# Patient Record
Sex: Female | Born: 1955 | Race: White | Hispanic: No | Smoking: Former smoker
Health system: Southern US, Community
[De-identification: ages and names within clinical notes are randomized; demographics above are authoritative.]

## PROBLEM LIST (undated history)

## (undated) DIAGNOSIS — F32A Depression, unspecified: Secondary | ICD-10-CM

## (undated) DIAGNOSIS — F988 Other specified behavioral and emotional disorders with onset usually occurring in childhood and adolescence: Secondary | ICD-10-CM

## (undated) DIAGNOSIS — Z8739 Personal history of other diseases of the musculoskeletal system and connective tissue: Secondary | ICD-10-CM

## (undated) DIAGNOSIS — E785 Hyperlipidemia, unspecified: Secondary | ICD-10-CM

## (undated) DIAGNOSIS — I469 Cardiac arrest, cause unspecified: Secondary | ICD-10-CM

## (undated) DIAGNOSIS — C801 Malignant (primary) neoplasm, unspecified: Secondary | ICD-10-CM

## (undated) DIAGNOSIS — R7303 Prediabetes: Secondary | ICD-10-CM

## (undated) DIAGNOSIS — F329 Major depressive disorder, single episode, unspecified: Secondary | ICD-10-CM

## (undated) DIAGNOSIS — F419 Anxiety disorder, unspecified: Secondary | ICD-10-CM

## (undated) DIAGNOSIS — T7840XA Allergy, unspecified, initial encounter: Secondary | ICD-10-CM

## (undated) DIAGNOSIS — K5900 Constipation, unspecified: Secondary | ICD-10-CM

## (undated) DIAGNOSIS — I10 Essential (primary) hypertension: Secondary | ICD-10-CM

## (undated) HISTORY — DX: Personal history of other diseases of the musculoskeletal system and connective tissue: Z87.39

## (undated) HISTORY — DX: Hyperlipidemia, unspecified: E78.5

## (undated) HISTORY — DX: Prediabetes: R73.03

## (undated) HISTORY — DX: Other specified behavioral and emotional disorders with onset usually occurring in childhood and adolescence: F98.8

## (undated) HISTORY — DX: Allergy, unspecified, initial encounter: T78.40XA

## (undated) HISTORY — DX: Essential (primary) hypertension: I10

## (undated) HISTORY — DX: Malignant (primary) neoplasm, unspecified: C80.1

## (undated) HISTORY — DX: Cardiac arrest, cause unspecified: I46.9

## (undated) HISTORY — DX: Constipation, unspecified: K59.00

---

## 1989-08-24 DIAGNOSIS — I469 Cardiac arrest, cause unspecified: Secondary | ICD-10-CM

## 1989-08-24 HISTORY — DX: Cardiac arrest, cause unspecified: I46.9

## 2000-12-27 ENCOUNTER — Other Ambulatory Visit: Admission: RE | Admit: 2000-12-27 | Discharge: 2000-12-27 | Payer: Self-pay | Admitting: Obstetrics and Gynecology

## 2001-08-24 HISTORY — PX: BREAST LUMPECTOMY: SHX2

## 2002-02-28 ENCOUNTER — Encounter (INDEPENDENT_AMBULATORY_CARE_PROVIDER_SITE_OTHER): Payer: Self-pay | Admitting: Specialist

## 2002-02-28 ENCOUNTER — Encounter: Payer: Self-pay | Admitting: General Surgery

## 2002-02-28 ENCOUNTER — Encounter: Admission: RE | Admit: 2002-02-28 | Discharge: 2002-02-28 | Payer: Self-pay | Admitting: General Surgery

## 2002-03-13 ENCOUNTER — Encounter (INDEPENDENT_AMBULATORY_CARE_PROVIDER_SITE_OTHER): Payer: Self-pay | Admitting: *Deleted

## 2002-03-13 ENCOUNTER — Encounter: Admission: RE | Admit: 2002-03-13 | Discharge: 2002-03-13 | Payer: Self-pay | Admitting: General Surgery

## 2002-03-13 ENCOUNTER — Ambulatory Visit (HOSPITAL_BASED_OUTPATIENT_CLINIC_OR_DEPARTMENT_OTHER): Admission: RE | Admit: 2002-03-13 | Discharge: 2002-03-13 | Payer: Self-pay | Admitting: General Surgery

## 2002-03-13 ENCOUNTER — Encounter: Payer: Self-pay | Admitting: General Surgery

## 2002-03-23 ENCOUNTER — Ambulatory Visit: Admission: RE | Admit: 2002-03-23 | Discharge: 2002-06-21 | Payer: Self-pay | Admitting: Radiation Oncology

## 2002-03-29 ENCOUNTER — Ambulatory Visit (HOSPITAL_COMMUNITY): Admission: RE | Admit: 2002-03-29 | Discharge: 2002-03-29 | Payer: Self-pay | Admitting: Oncology

## 2002-03-29 ENCOUNTER — Encounter: Payer: Self-pay | Admitting: Oncology

## 2002-03-31 ENCOUNTER — Encounter: Payer: Self-pay | Admitting: Oncology

## 2002-03-31 ENCOUNTER — Ambulatory Visit (HOSPITAL_COMMUNITY): Admission: RE | Admit: 2002-03-31 | Discharge: 2002-03-31 | Payer: Self-pay | Admitting: Oncology

## 2002-06-18 ENCOUNTER — Encounter: Payer: Self-pay | Admitting: Otolaryngology

## 2002-06-18 ENCOUNTER — Ambulatory Visit (HOSPITAL_COMMUNITY): Admission: RE | Admit: 2002-06-18 | Discharge: 2002-06-18 | Payer: Self-pay | Admitting: Otolaryngology

## 2002-06-27 ENCOUNTER — Ambulatory Visit: Admission: RE | Admit: 2002-06-27 | Discharge: 2002-09-02 | Payer: Self-pay | Admitting: Radiation Oncology

## 2002-08-16 ENCOUNTER — Ambulatory Visit (HOSPITAL_COMMUNITY): Admission: RE | Admit: 2002-08-16 | Discharge: 2002-08-16 | Payer: Self-pay | Admitting: Radiation Oncology

## 2002-08-30 ENCOUNTER — Ambulatory Visit (HOSPITAL_COMMUNITY): Admission: RE | Admit: 2002-08-30 | Discharge: 2002-08-30 | Payer: Self-pay | Admitting: Radiation Oncology

## 2002-09-20 ENCOUNTER — Encounter: Payer: Self-pay | Admitting: Oncology

## 2002-09-20 ENCOUNTER — Ambulatory Visit (HOSPITAL_COMMUNITY): Admission: RE | Admit: 2002-09-20 | Discharge: 2002-09-20 | Payer: Self-pay | Admitting: Oncology

## 2002-10-05 ENCOUNTER — Encounter: Payer: Self-pay | Admitting: Internal Medicine

## 2002-10-05 ENCOUNTER — Encounter: Admission: RE | Admit: 2002-10-05 | Discharge: 2002-10-05 | Payer: Self-pay | Admitting: Internal Medicine

## 2002-10-11 ENCOUNTER — Ambulatory Visit: Admission: RE | Admit: 2002-10-11 | Discharge: 2002-10-11 | Payer: Self-pay | Admitting: Oncology

## 2003-02-12 ENCOUNTER — Other Ambulatory Visit: Admission: RE | Admit: 2003-02-12 | Discharge: 2003-02-12 | Payer: Self-pay | Admitting: Obstetrics and Gynecology

## 2003-03-06 ENCOUNTER — Ambulatory Visit: Admission: RE | Admit: 2003-03-06 | Discharge: 2003-03-06 | Payer: Self-pay | Admitting: Oncology

## 2003-03-12 ENCOUNTER — Encounter: Payer: Self-pay | Admitting: Oncology

## 2003-03-12 ENCOUNTER — Encounter: Admission: RE | Admit: 2003-03-12 | Discharge: 2003-03-12 | Payer: Self-pay | Admitting: Oncology

## 2003-07-11 ENCOUNTER — Encounter: Admission: RE | Admit: 2003-07-11 | Discharge: 2003-07-11 | Payer: Self-pay | Admitting: Oncology

## 2003-08-25 HISTORY — PX: CARDIOVASCULAR STRESS TEST: SHX262

## 2003-10-30 ENCOUNTER — Encounter: Payer: Self-pay | Admitting: Internal Medicine

## 2004-02-04 ENCOUNTER — Encounter: Admission: RE | Admit: 2004-02-04 | Discharge: 2004-02-04 | Payer: Self-pay | Admitting: Obstetrics and Gynecology

## 2004-06-11 ENCOUNTER — Ambulatory Visit (HOSPITAL_BASED_OUTPATIENT_CLINIC_OR_DEPARTMENT_OTHER): Admission: RE | Admit: 2004-06-11 | Discharge: 2004-06-11 | Payer: Self-pay | Admitting: Obstetrics and Gynecology

## 2004-06-11 ENCOUNTER — Ambulatory Visit (HOSPITAL_COMMUNITY): Admission: RE | Admit: 2004-06-11 | Discharge: 2004-06-11 | Payer: Self-pay | Admitting: Obstetrics and Gynecology

## 2004-06-11 ENCOUNTER — Encounter (INDEPENDENT_AMBULATORY_CARE_PROVIDER_SITE_OTHER): Payer: Self-pay | Admitting: Specialist

## 2004-08-24 HISTORY — PX: COLONOSCOPY: SHX174

## 2004-09-11 ENCOUNTER — Ambulatory Visit: Payer: Self-pay | Admitting: Oncology

## 2004-10-01 ENCOUNTER — Encounter: Admission: RE | Admit: 2004-10-01 | Discharge: 2004-10-01 | Payer: Self-pay | Admitting: Oncology

## 2005-03-03 ENCOUNTER — Ambulatory Visit: Payer: Self-pay | Admitting: Oncology

## 2005-03-10 ENCOUNTER — Encounter: Admission: RE | Admit: 2005-03-10 | Discharge: 2005-03-10 | Payer: Self-pay | Admitting: Oncology

## 2005-03-20 ENCOUNTER — Ambulatory Visit: Payer: Self-pay | Admitting: Internal Medicine

## 2005-04-10 ENCOUNTER — Encounter: Admission: RE | Admit: 2005-04-10 | Discharge: 2005-07-09 | Payer: Self-pay | Admitting: Internal Medicine

## 2005-04-20 ENCOUNTER — Ambulatory Visit: Payer: Self-pay | Admitting: Gastroenterology

## 2005-04-20 ENCOUNTER — Ambulatory Visit: Payer: Self-pay | Admitting: Family Medicine

## 2005-04-29 ENCOUNTER — Ambulatory Visit: Payer: Self-pay | Admitting: Gastroenterology

## 2005-06-09 ENCOUNTER — Ambulatory Visit: Payer: Self-pay | Admitting: Family Medicine

## 2005-06-16 ENCOUNTER — Ambulatory Visit: Payer: Self-pay | Admitting: Family Medicine

## 2005-06-23 ENCOUNTER — Ambulatory Visit: Payer: Self-pay | Admitting: Family Medicine

## 2005-09-03 ENCOUNTER — Ambulatory Visit: Payer: Self-pay | Admitting: Oncology

## 2005-10-02 ENCOUNTER — Ambulatory Visit: Payer: Self-pay | Admitting: Internal Medicine

## 2005-11-05 ENCOUNTER — Ambulatory Visit: Payer: Self-pay | Admitting: Oncology

## 2005-12-28 ENCOUNTER — Ambulatory Visit: Payer: Self-pay | Admitting: Internal Medicine

## 2006-02-26 ENCOUNTER — Ambulatory Visit: Payer: Self-pay | Admitting: Oncology

## 2006-03-02 ENCOUNTER — Ambulatory Visit: Payer: Self-pay | Admitting: Gastroenterology

## 2006-03-05 LAB — CBC WITH DIFFERENTIAL/PLATELET
BASO%: 0.6 % (ref 0.0–2.0)
EOS%: 3.6 % (ref 0.0–7.0)
LYMPH%: 46.7 % (ref 14.0–48.0)
MCH: 33.2 pg (ref 26.0–34.0)
MCHC: 34.9 g/dL (ref 32.0–36.0)
MONO#: 0.3 10*3/uL (ref 0.1–0.9)
Platelets: 283 10*3/uL (ref 145–400)
RBC: 4.12 10*6/uL (ref 3.70–5.32)
WBC: 5.5 10*3/uL (ref 3.9–10.0)

## 2006-03-05 LAB — CANCER ANTIGEN 27.29: CA 27.29: 14 U/mL (ref 0–39)

## 2006-03-05 LAB — COMPREHENSIVE METABOLIC PANEL
ALT: 54 U/L — ABNORMAL HIGH (ref 0–40)
AST: 44 U/L — ABNORMAL HIGH (ref 0–37)
Alkaline Phosphatase: 69 U/L (ref 39–117)
CO2: 26 mEq/L (ref 19–32)
Creatinine, Ser: 0.68 mg/dL (ref 0.40–1.20)
Sodium: 142 mEq/L (ref 135–145)
Total Bilirubin: 0.3 mg/dL (ref 0.3–1.2)
Total Protein: 6.7 g/dL (ref 6.0–8.3)

## 2006-03-11 ENCOUNTER — Encounter: Admission: RE | Admit: 2006-03-11 | Discharge: 2006-03-11 | Payer: Self-pay | Admitting: Oncology

## 2006-08-03 ENCOUNTER — Ambulatory Visit: Payer: Self-pay | Admitting: Gastroenterology

## 2006-09-23 ENCOUNTER — Ambulatory Visit: Payer: Self-pay | Admitting: Oncology

## 2006-09-28 ENCOUNTER — Encounter: Admission: RE | Admit: 2006-09-28 | Discharge: 2006-09-28 | Payer: Self-pay | Admitting: Oncology

## 2006-09-28 LAB — CBC WITH DIFFERENTIAL/PLATELET
BASO%: 0.1 % (ref 0.0–2.0)
EOS%: 2.4 % (ref 0.0–7.0)
HCT: 38.4 % (ref 34.8–46.6)
LYMPH%: 31.1 % (ref 14.0–48.0)
MCH: 33.3 pg (ref 26.0–34.0)
MCHC: 36.1 g/dL — ABNORMAL HIGH (ref 32.0–36.0)
MCV: 92.1 fL (ref 81.0–101.0)
MONO%: 6.7 % (ref 0.0–13.0)
NEUT%: 59.7 % (ref 39.6–76.8)
Platelets: 275 10*3/uL (ref 145–400)
RBC: 4.18 10*6/uL (ref 3.70–5.32)
WBC: 8.5 10*3/uL (ref 3.9–10.0)

## 2006-09-28 LAB — COMPREHENSIVE METABOLIC PANEL
ALT: 38 U/L — ABNORMAL HIGH (ref 0–35)
Alkaline Phosphatase: 79 U/L (ref 39–117)
Creatinine, Ser: 0.67 mg/dL (ref 0.40–1.20)
Sodium: 138 mEq/L (ref 135–145)
Total Bilirubin: 0.4 mg/dL (ref 0.3–1.2)
Total Protein: 7.8 g/dL (ref 6.0–8.3)

## 2007-03-14 ENCOUNTER — Encounter: Admission: RE | Admit: 2007-03-14 | Discharge: 2007-03-14 | Payer: Self-pay | Admitting: Oncology

## 2007-03-22 ENCOUNTER — Encounter: Admission: RE | Admit: 2007-03-22 | Discharge: 2007-03-22 | Payer: Self-pay | Admitting: Oncology

## 2007-03-28 ENCOUNTER — Ambulatory Visit: Payer: Self-pay | Admitting: Oncology

## 2007-03-30 LAB — CBC WITH DIFFERENTIAL/PLATELET
Basophils Absolute: 0 10*3/uL (ref 0.0–0.1)
EOS%: 1.8 % (ref 0.0–7.0)
Eosinophils Absolute: 0.1 10*3/uL (ref 0.0–0.5)
HCT: 42.9 % (ref 34.8–46.6)
HGB: 15.2 g/dL (ref 11.6–15.9)
MCH: 33.9 pg (ref 26.0–34.0)
MCV: 95.6 fL (ref 81.0–101.0)
MONO%: 7 % (ref 0.0–13.0)
NEUT#: 5 10*3/uL (ref 1.5–6.5)
NEUT%: 62.3 % (ref 39.6–76.8)
RDW: 12.8 % (ref 11.3–14.5)
lymph#: 2.3 10*3/uL (ref 0.9–3.3)

## 2007-03-30 LAB — COMPREHENSIVE METABOLIC PANEL
AST: 32 U/L (ref 0–37)
Albumin: 4.7 g/dL (ref 3.5–5.2)
BUN: 14 mg/dL (ref 6–23)
Calcium: 9.8 mg/dL (ref 8.4–10.5)
Chloride: 102 mEq/L (ref 96–112)
Creatinine, Ser: 0.83 mg/dL (ref 0.40–1.20)
Glucose, Bld: 96 mg/dL (ref 70–99)
Potassium: 4.2 mEq/L (ref 3.5–5.3)

## 2007-03-30 LAB — CANCER ANTIGEN 27.29: CA 27.29: 16 U/mL (ref 0–39)

## 2007-03-30 LAB — LACTATE DEHYDROGENASE: LDH: 170 U/L (ref 94–250)

## 2007-04-04 ENCOUNTER — Encounter: Payer: Self-pay | Admitting: Internal Medicine

## 2007-06-07 ENCOUNTER — Ambulatory Visit: Payer: Self-pay | Admitting: Internal Medicine

## 2007-06-07 DIAGNOSIS — I10 Essential (primary) hypertension: Secondary | ICD-10-CM | POA: Insufficient documentation

## 2007-06-07 LAB — CONVERTED CEMR LAB: LDL Goal: 160 mg/dL

## 2007-06-13 ENCOUNTER — Telehealth (INDEPENDENT_AMBULATORY_CARE_PROVIDER_SITE_OTHER): Payer: Self-pay | Admitting: *Deleted

## 2007-06-30 ENCOUNTER — Telehealth (INDEPENDENT_AMBULATORY_CARE_PROVIDER_SITE_OTHER): Payer: Self-pay | Admitting: *Deleted

## 2007-07-27 ENCOUNTER — Ambulatory Visit: Payer: Self-pay | Admitting: Family Medicine

## 2007-09-07 ENCOUNTER — Ambulatory Visit: Payer: Self-pay | Admitting: Oncology

## 2007-09-15 LAB — COMPREHENSIVE METABOLIC PANEL
ALT: 37 U/L — ABNORMAL HIGH (ref 0–35)
Albumin: 4.6 g/dL (ref 3.5–5.2)
Alkaline Phosphatase: 68 U/L (ref 39–117)
CO2: 24 mEq/L (ref 19–32)
Glucose, Bld: 106 mg/dL — ABNORMAL HIGH (ref 70–99)
Potassium: 4.4 mEq/L (ref 3.5–5.3)
Sodium: 139 mEq/L (ref 135–145)
Total Bilirubin: 0.5 mg/dL (ref 0.3–1.2)
Total Protein: 7.5 g/dL (ref 6.0–8.3)

## 2007-09-15 LAB — CBC WITH DIFFERENTIAL/PLATELET
BASO%: 1.2 % (ref 0.0–2.0)
Eosinophils Absolute: 0.2 10*3/uL (ref 0.0–0.5)
MCHC: 34.3 g/dL (ref 32.0–36.0)
MONO#: 0.4 10*3/uL (ref 0.1–0.9)
NEUT#: 4.6 10*3/uL (ref 1.5–6.5)
RBC: 4.46 10*6/uL (ref 3.70–5.32)
RDW: 12.7 % (ref 11.3–14.5)
WBC: 7.4 10*3/uL (ref 3.9–10.0)

## 2007-09-15 LAB — CANCER ANTIGEN 27.29: CA 27.29: 20 U/mL (ref 0–39)

## 2007-09-16 ENCOUNTER — Encounter: Payer: Self-pay | Admitting: Internal Medicine

## 2007-09-27 ENCOUNTER — Ambulatory Visit: Payer: Self-pay | Admitting: Internal Medicine

## 2007-10-05 ENCOUNTER — Encounter (INDEPENDENT_AMBULATORY_CARE_PROVIDER_SITE_OTHER): Payer: Self-pay | Admitting: *Deleted

## 2007-10-11 ENCOUNTER — Telehealth: Payer: Self-pay | Admitting: Internal Medicine

## 2007-10-14 ENCOUNTER — Encounter (INDEPENDENT_AMBULATORY_CARE_PROVIDER_SITE_OTHER): Payer: Self-pay | Admitting: *Deleted

## 2007-10-19 ENCOUNTER — Ambulatory Visit: Payer: Self-pay | Admitting: Internal Medicine

## 2007-10-19 DIAGNOSIS — D0590 Unspecified type of carcinoma in situ of unspecified breast: Secondary | ICD-10-CM | POA: Insufficient documentation

## 2007-10-19 DIAGNOSIS — E785 Hyperlipidemia, unspecified: Secondary | ICD-10-CM | POA: Insufficient documentation

## 2007-10-19 DIAGNOSIS — R002 Palpitations: Secondary | ICD-10-CM | POA: Insufficient documentation

## 2007-10-19 DIAGNOSIS — R768 Other specified abnormal immunological findings in serum: Secondary | ICD-10-CM | POA: Insufficient documentation

## 2007-10-19 LAB — CONVERTED CEMR LAB: LDL Goal: 130 mg/dL

## 2008-03-05 ENCOUNTER — Ambulatory Visit: Payer: Self-pay | Admitting: Oncology

## 2008-03-08 LAB — CBC WITH DIFFERENTIAL/PLATELET
Basophils Absolute: 0.1 10*3/uL (ref 0.0–0.1)
Eosinophils Absolute: 0.5 10*3/uL (ref 0.0–0.5)
HGB: 14.4 g/dL (ref 11.6–15.9)
MCV: 93.9 fL (ref 81.0–101.0)
MONO#: 0.4 10*3/uL (ref 0.1–0.9)
MONO%: 6.3 % (ref 0.0–13.0)
NEUT#: 3.3 10*3/uL (ref 1.5–6.5)
Platelets: 299 10*3/uL (ref 145–400)
RBC: 4.4 10*6/uL (ref 3.70–5.32)
RDW: 12 % (ref 11.3–14.5)
WBC: 7.2 10*3/uL (ref 3.9–10.0)

## 2008-03-09 LAB — COMPREHENSIVE METABOLIC PANEL
Albumin: 4.6 g/dL (ref 3.5–5.2)
Alkaline Phosphatase: 74 U/L (ref 39–117)
BUN: 11 mg/dL (ref 6–23)
CO2: 26 mEq/L (ref 19–32)
Calcium: 9.5 mg/dL (ref 8.4–10.5)
Glucose, Bld: 93 mg/dL (ref 70–99)
Potassium: 4.6 mEq/L (ref 3.5–5.3)
Sodium: 138 mEq/L (ref 135–145)
Total Protein: 7.3 g/dL (ref 6.0–8.3)

## 2008-03-09 LAB — VITAMIN D 25 HYDROXY (VIT D DEFICIENCY, FRACTURES): Vit D, 25-Hydroxy: 36 ng/mL (ref 30–89)

## 2008-03-09 LAB — LACTATE DEHYDROGENASE: LDH: 157 U/L (ref 94–250)

## 2008-03-09 LAB — CANCER ANTIGEN 27.29: CA 27.29: 19 U/mL (ref 0–39)

## 2008-03-23 ENCOUNTER — Encounter: Admission: RE | Admit: 2008-03-23 | Discharge: 2008-03-23 | Payer: Self-pay | Admitting: Oncology

## 2008-10-23 ENCOUNTER — Ambulatory Visit: Payer: Self-pay | Admitting: Oncology

## 2008-10-24 ENCOUNTER — Ambulatory Visit: Payer: Self-pay | Admitting: Internal Medicine

## 2008-10-26 ENCOUNTER — Encounter: Payer: Self-pay | Admitting: Internal Medicine

## 2008-10-26 LAB — CBC WITH DIFFERENTIAL/PLATELET
BASO%: 0.5 % (ref 0.0–2.0)
EOS%: 4.7 % (ref 0.0–7.0)
Eosinophils Absolute: 0.4 10*3/uL (ref 0.0–0.5)
LYMPH%: 35.5 % (ref 14.0–49.7)
MCHC: 35.1 g/dL (ref 31.5–36.0)
MCV: 92.1 fL (ref 79.5–101.0)
MONO%: 6.7 % (ref 0.0–14.0)
NEUT#: 3.9 10*3/uL (ref 1.5–6.5)
RBC: 4.83 10*6/uL (ref 3.70–5.45)
RDW: 12.2 % (ref 11.2–14.5)

## 2008-10-26 LAB — LIPID PANEL
Cholesterol: 281 mg/dL — ABNORMAL HIGH (ref 0–200)
VLDL: 40 mg/dL (ref 0–40)

## 2008-10-29 LAB — COMPREHENSIVE METABOLIC PANEL
ALT: 35 U/L (ref 0–35)
AST: 26 U/L (ref 0–37)
Albumin: 4.7 g/dL (ref 3.5–5.2)
Alkaline Phosphatase: 84 U/L (ref 39–117)
BUN: 12 mg/dL (ref 6–23)
CO2: 26 mEq/L (ref 19–32)
Calcium: 9.8 mg/dL (ref 8.4–10.5)
Chloride: 105 mEq/L (ref 96–112)
Creatinine, Ser: 0.72 mg/dL (ref 0.40–1.20)
Glucose, Bld: 102 mg/dL — ABNORMAL HIGH (ref 70–99)
Potassium: 4.5 mEq/L (ref 3.5–5.3)
Sodium: 141 mEq/L (ref 135–145)
Total Bilirubin: 0.5 mg/dL (ref 0.3–1.2)
Total Protein: 7.7 g/dL (ref 6.0–8.3)

## 2008-10-29 LAB — LACTATE DEHYDROGENASE: LDH: 168 U/L (ref 94–250)

## 2008-10-29 LAB — CANCER ANTIGEN 27.29: CA 27.29: 22 U/mL (ref 0–39)

## 2008-10-29 LAB — VITAMIN D 25 HYDROXY (VIT D DEFICIENCY, FRACTURES): Vit D, 25-Hydroxy: 18 ng/mL — ABNORMAL LOW (ref 30–89)

## 2008-11-01 ENCOUNTER — Encounter: Payer: Self-pay | Admitting: Internal Medicine

## 2008-11-29 ENCOUNTER — Telehealth (INDEPENDENT_AMBULATORY_CARE_PROVIDER_SITE_OTHER): Payer: Self-pay | Admitting: *Deleted

## 2008-12-13 ENCOUNTER — Encounter: Payer: Self-pay | Admitting: Internal Medicine

## 2008-12-18 ENCOUNTER — Ambulatory Visit: Payer: Self-pay | Admitting: Internal Medicine

## 2008-12-18 DIAGNOSIS — M8580 Other specified disorders of bone density and structure, unspecified site: Secondary | ICD-10-CM | POA: Insufficient documentation

## 2008-12-18 DIAGNOSIS — E559 Vitamin D deficiency, unspecified: Secondary | ICD-10-CM | POA: Insufficient documentation

## 2008-12-18 DIAGNOSIS — Z853 Personal history of malignant neoplasm of breast: Secondary | ICD-10-CM | POA: Insufficient documentation

## 2009-03-14 ENCOUNTER — Telehealth (INDEPENDENT_AMBULATORY_CARE_PROVIDER_SITE_OTHER): Payer: Self-pay | Admitting: *Deleted

## 2009-03-25 ENCOUNTER — Encounter: Admission: RE | Admit: 2009-03-25 | Discharge: 2009-03-25 | Payer: Self-pay | Admitting: Oncology

## 2009-04-05 ENCOUNTER — Ambulatory Visit: Payer: Self-pay | Admitting: Internal Medicine

## 2009-04-11 ENCOUNTER — Telehealth: Payer: Self-pay | Admitting: Internal Medicine

## 2009-04-11 ENCOUNTER — Ambulatory Visit: Payer: Self-pay | Admitting: Internal Medicine

## 2009-05-01 ENCOUNTER — Ambulatory Visit: Payer: Self-pay | Admitting: Internal Medicine

## 2009-05-03 ENCOUNTER — Telehealth (INDEPENDENT_AMBULATORY_CARE_PROVIDER_SITE_OTHER): Payer: Self-pay | Admitting: *Deleted

## 2009-05-10 ENCOUNTER — Encounter (INDEPENDENT_AMBULATORY_CARE_PROVIDER_SITE_OTHER): Payer: Self-pay | Admitting: *Deleted

## 2009-05-15 ENCOUNTER — Telehealth (INDEPENDENT_AMBULATORY_CARE_PROVIDER_SITE_OTHER): Payer: Self-pay | Admitting: *Deleted

## 2009-05-20 ENCOUNTER — Ambulatory Visit: Payer: Self-pay | Admitting: Internal Medicine

## 2009-05-20 DIAGNOSIS — R209 Unspecified disturbances of skin sensation: Secondary | ICD-10-CM | POA: Insufficient documentation

## 2009-05-21 ENCOUNTER — Encounter (INDEPENDENT_AMBULATORY_CARE_PROVIDER_SITE_OTHER): Payer: Self-pay | Admitting: *Deleted

## 2009-05-21 LAB — CONVERTED CEMR LAB: Vitamin B-12: 378 pg/mL (ref 211–911)

## 2009-05-24 ENCOUNTER — Ambulatory Visit: Payer: Self-pay | Admitting: Oncology

## 2009-05-28 LAB — CBC WITH DIFFERENTIAL/PLATELET
BASO%: 0.5 % (ref 0.0–2.0)
Eosinophils Absolute: 0.1 10*3/uL (ref 0.0–0.5)
LYMPH%: 26.5 % (ref 14.0–49.7)
MCHC: 35.1 g/dL (ref 31.5–36.0)
MCV: 96.1 fL (ref 79.5–101.0)
MONO%: 7.2 % (ref 0.0–14.0)
Platelets: 264 10*3/uL (ref 145–400)
RBC: 4.38 10*6/uL (ref 3.70–5.45)

## 2009-05-28 LAB — COMPREHENSIVE METABOLIC PANEL
Alkaline Phosphatase: 77 U/L (ref 39–117)
Glucose, Bld: 119 mg/dL — ABNORMAL HIGH (ref 70–99)
Sodium: 134 mEq/L — ABNORMAL LOW (ref 135–145)
Total Bilirubin: 0.5 mg/dL (ref 0.3–1.2)
Total Protein: 7.1 g/dL (ref 6.0–8.3)

## 2009-05-29 LAB — VITAMIN D 25 HYDROXY (VIT D DEFICIENCY, FRACTURES): Vit D, 25-Hydroxy: 35 ng/mL (ref 30–89)

## 2009-05-29 LAB — CANCER ANTIGEN 27.29: CA 27.29: 15 U/mL (ref 0–39)

## 2009-06-05 ENCOUNTER — Encounter: Payer: Self-pay | Admitting: Internal Medicine

## 2009-06-25 ENCOUNTER — Telehealth (INDEPENDENT_AMBULATORY_CARE_PROVIDER_SITE_OTHER): Payer: Self-pay | Admitting: *Deleted

## 2009-06-26 ENCOUNTER — Encounter: Admission: RE | Admit: 2009-06-26 | Discharge: 2009-06-26 | Payer: Self-pay | Admitting: Oncology

## 2009-09-23 ENCOUNTER — Ambulatory Visit: Payer: Self-pay | Admitting: Internal Medicine

## 2009-09-23 LAB — CONVERTED CEMR LAB
AST: 30 units/L (ref 0–37)
Alkaline Phosphatase: 63 units/L (ref 39–117)
Cholesterol: 269 mg/dL — ABNORMAL HIGH (ref 0–200)
HDL: 63.9 mg/dL (ref >39.00)
Total CHOL/HDL Ratio: 4
Total Protein: 7.6 g/dL (ref 6.0–8.3)
Triglycerides: 260 mg/dL — ABNORMAL HIGH (ref 0.0–149.0)
VLDL: 52 mg/dL — ABNORMAL HIGH (ref 0.0–40.0)
Vit D, 25-Hydroxy: 31 ng/mL (ref 30–89)

## 2009-10-01 ENCOUNTER — Ambulatory Visit: Payer: Self-pay | Admitting: Internal Medicine

## 2009-10-01 DIAGNOSIS — R946 Abnormal results of thyroid function studies: Secondary | ICD-10-CM | POA: Insufficient documentation

## 2009-10-01 DIAGNOSIS — R7401 Elevation of levels of liver transaminase levels: Secondary | ICD-10-CM | POA: Insufficient documentation

## 2009-10-01 DIAGNOSIS — R740 Nonspecific elevation of levels of transaminase and lactic acid dehydrogenase [LDH]: Secondary | ICD-10-CM

## 2009-10-15 ENCOUNTER — Telehealth (INDEPENDENT_AMBULATORY_CARE_PROVIDER_SITE_OTHER): Payer: Self-pay | Admitting: *Deleted

## 2009-10-15 DIAGNOSIS — R519 Headache, unspecified: Secondary | ICD-10-CM | POA: Insufficient documentation

## 2009-10-15 DIAGNOSIS — R51 Headache: Secondary | ICD-10-CM | POA: Insufficient documentation

## 2009-10-22 ENCOUNTER — Encounter: Payer: Self-pay | Admitting: Internal Medicine

## 2009-10-28 LAB — CONVERTED CEMR LAB: Metaneph Total, Ur: 234 ug/24hr (ref 224–832)

## 2010-04-03 ENCOUNTER — Encounter: Admission: RE | Admit: 2010-04-03 | Discharge: 2010-04-03 | Payer: Self-pay | Admitting: Oncology

## 2010-06-10 ENCOUNTER — Ambulatory Visit: Payer: Self-pay | Admitting: Oncology

## 2010-06-12 LAB — COMPREHENSIVE METABOLIC PANEL
AST: 31 U/L (ref 0–37)
Albumin: 4.6 g/dL (ref 3.5–5.2)
Alkaline Phosphatase: 75 U/L (ref 39–117)
BUN: 8 mg/dL (ref 6–23)
Calcium: 9.9 mg/dL (ref 8.4–10.5)
Chloride: 102 mEq/L (ref 96–112)
Sodium: 140 mEq/L (ref 135–145)
Total Bilirubin: 0.5 mg/dL (ref 0.3–1.2)

## 2010-06-12 LAB — CBC WITH DIFFERENTIAL/PLATELET
BASO%: 0.3 % (ref 0.0–2.0)
Eosinophils Absolute: 0.2 10*3/uL (ref 0.0–0.5)
MONO#: 0.5 10*3/uL (ref 0.1–0.9)
MONO%: 6.7 % (ref 0.0–14.0)
RBC: 4.57 10*6/uL (ref 3.70–5.45)
RDW: 12.6 % (ref 11.2–14.5)
WBC: 8 10*3/uL (ref 3.9–10.3)

## 2010-06-12 LAB — CANCER ANTIGEN 27.29: CA 27.29: 25 U/mL (ref 0–39)

## 2010-06-12 LAB — LACTATE DEHYDROGENASE: LDH: 159 U/L (ref 94–250)

## 2010-06-19 ENCOUNTER — Encounter: Payer: Self-pay | Admitting: Internal Medicine

## 2010-08-04 ENCOUNTER — Ambulatory Visit: Payer: Self-pay | Admitting: Internal Medicine

## 2010-08-06 DIAGNOSIS — R5381 Other malaise: Secondary | ICD-10-CM | POA: Insufficient documentation

## 2010-08-06 DIAGNOSIS — K219 Gastro-esophageal reflux disease without esophagitis: Secondary | ICD-10-CM | POA: Insufficient documentation

## 2010-08-06 DIAGNOSIS — R5383 Other fatigue: Secondary | ICD-10-CM

## 2010-08-06 DIAGNOSIS — G479 Sleep disorder, unspecified: Secondary | ICD-10-CM | POA: Insufficient documentation

## 2010-08-06 DIAGNOSIS — F411 Generalized anxiety disorder: Secondary | ICD-10-CM | POA: Insufficient documentation

## 2010-08-08 ENCOUNTER — Ambulatory Visit: Payer: Self-pay | Admitting: Internal Medicine

## 2010-08-11 LAB — CONVERTED CEMR LAB
Basophils Absolute: 0.1 10*3/uL (ref 0.0–0.1)
Basophils Relative: 0.8 % (ref 0.0–3.0)
Creatinine, Ser: 0.7 mg/dL (ref 0.4–1.2)
Eosinophils Absolute: 0.3 10*3/uL (ref 0.0–0.7)
HCT: 42.8 % (ref 36.0–46.0)
Hemoglobin: 14.9 g/dL (ref 12.0–15.0)
Lymphocytes Relative: 43.2 % (ref 12.0–46.0)
MCV: 96 fL (ref 78.0–100.0)
Monocytes Relative: 8.1 % (ref 3.0–12.0)
Neutrophils Relative %: 43.2 % (ref 43.0–77.0)
Potassium: 4.6 meq/L (ref 3.5–5.1)
TSH: 0.91 microintl units/mL (ref 0.35–5.50)
WBC: 6.5 10*3/uL (ref 4.5–10.5)

## 2010-08-24 HISTORY — PX: WRIST SURGERY: SHX841

## 2010-09-13 ENCOUNTER — Other Ambulatory Visit: Payer: Self-pay | Admitting: Oncology

## 2010-09-13 DIAGNOSIS — Z1239 Encounter for other screening for malignant neoplasm of breast: Secondary | ICD-10-CM

## 2010-09-14 ENCOUNTER — Encounter: Payer: Self-pay | Admitting: Oncology

## 2010-09-15 ENCOUNTER — Encounter: Payer: Self-pay | Admitting: Oncology

## 2010-09-23 NOTE — Progress Notes (Signed)
Summary: phone  Phone Note Call from Patient Call back at Home Phone [phone]   Caller: Patient Summary of Call: Patient is stating that her bp is high and that gets a headache and flushed. Patient wants to know if she should take to of her pills. Patient stated this morning it was 149/94.Please advise Initial call taken by: Barb Merino,  October 15, 2009 2:45 PM  Follow-up for Phone Call        pt states that when her BP is elevated she begans to experience facial flushing, heart racing, slight headache which has been ongoing for a couple of weeks now. pt denies any SOB, chest pain or pressure, numbness, or tingling. Pt today was bp 149/94 pulse 90. pls advise...............Marland KitchenFelecia Deloach CMA  October 15, 2009 2:48 PM   Additional Follow-up for Phone Call Additional follow up Details #1::        Toprol XR 25 q 8-12 hrs as needed in addition to once daily dose . PLease collect 24 hr urine for catecholamines & metanephrines (784.0, 401.9, flushing; R/O pheochromocytoma) Additional Follow-up by: Marga Melnick MD,  October 15, 2009 2:58 PM  New Problems: HEADACHE (ICD-784.0)   Additional Follow-up for Phone Call Additional follow up Details #2::    pt aware will pick-up 24hr urine container............Marland KitchenFelecia Deloach CMA  October 15, 2009 3:13 PM   New Problems: HEADACHE (ICD-784.0)

## 2010-09-23 NOTE — Assessment & Plan Note (Signed)
Summary: 4 MTH FU/NS/KDC, resched- jr   Vital Signs:  Patient profile:   55 year old female Height:      63.5 inches Weight:      159.2 pounds BMI:     27.86 Temp:     98.1 degrees F Pulse rate:   72 / minute Resp:     15 per minute BP sitting:   120 / 72  Vitals Entered By: Shonna Chock (October 01, 2009 8:55 AM) CC: 4 month follow-up, Lipid Management Comments REVIEWED MED LIST, PATIENT AGREED DOSE AND INSTRUCTION CORRECT    CC:  4 month follow-up and Lipid Management.  History of Present Illness: Vitamin D level up to 31 from 21 ; now on 2000 International Units once daily. TFTs are low normal, on no thyroid supplement. Lipids & NMR Lipoprofile reviewed & risks & options discussed. LDL 180 from 204 on Crestor 10mg  2 on Monday. Increased fiber & fruits & vegetables. No CVE ; she fell dancing & broke radius.  Lipid Management History:      Positive NCEP/ATP III risk factors include female age 53 years old or older, early menopause without estrogen hormone replacement, and hypertension.  Negative NCEP/ATP III risk factors include non-diabetic, no family history for ischemic heart disease, non-tobacco-user status, no ASHD (atherosclerotic heart disease), no prior stroke/TIA, no peripheral vascular disease, and no history of aortic aneurysm.     Allergies: No Known Drug Allergies  Past History:  Past Medical History: Osteopenia (Dr Donnie Coffin does BMD annually due to Arimadex Rx); ? Hepatitis C 2007; negative evaluation @ [facility] C  [facility]  Hyperlipidemia: NMR: LDL 204(2362/918),HDL 71, TG 247 Vitamin D deficiency Breast cancer, hx of  Past Surgical History: Postpartum arrest- ? PTE (1991) Stress Cardiolyte   negative 2005 Left breast CA- lumpectomy, radiation x  6 weeks, chemo. x 3 weeks, Arimadex , Dr Donnie Coffin G4 P2 Colonoscopy- nl. (2006); Plate & 10 screws L wrist 09/25/2009, Dr Elita Quick  Review of Systems General:  Complains of fatigue. Eyes:  Denies blurring,  double vision, and vision loss-both eyes. ENT:  Denies difficulty swallowing and hoarseness. CV:  Denies palpitations; Edema L hand below cast. Resp:  Denies excessive snoring, hypersomnolence, and morning headaches. GI:  Denies constipation and diarrhea. Derm:  Denies changes in nail beds and dryness; Hair texture changing. Neuro:  Denies numbness and tingling. Endo:  Denies cold intolerance, excessive hunger, excessive thirst, excessive urination, and heat intolerance.  Physical Exam  General:  well-nourished,in no acute distress; alert and cooperative throughout examination Eyes:  No corneal or conjunctival inflammation noted.Perrla. No lid lag; no icterus Neck:  No deformities, masses, or tenderness noted. Heart:  Normal rate and regular rhythm. S1 and S2 normal without gallop, murmur, click, rub . S4 Abdomen:  Bowel sounds positive,abdomen soft and non-tender without masses, organomegaly or hernias noted. Pulses:  R and L carotid,R radial, R& L dorsalis pedis and posterior tibial pulses are full and equal bilaterally(Note L wrist in ncast Extremities:  No clubbing, cyanosis, edema. Slight edema L hand Skin:  Intact without suspicious lesions or rashes. No jaundice Psych:  memory intact for recent and remote, normally interactive, and good eye contact.     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 Take 1 tab  q 8 hrs as needed 2)  Citalopram Hydrobromide 20 Mg Tabs (Citalopram hydrobromide) .Marland Kitchen.. 1 once daily 3)  Toprol Xl 25 Mg Xr24h-tab (Metoprolol succinate) .Marland Kitchen.. 1 once daily 4)  Vitamin D3 50000 Unit Caps (cholecalciferol)  .Marland Kitchen.. 1 pill once weekly 5)  Crestor  10 Mg Tabs (rosuvastatin Calcium)  .Marland Kitchen.. 1 pill m,w & fri 6)  Zolpidem Tartrate 5 Mg Tabs (Zolpidem tartrate) .Marland Kitchen.. 1 by mouth every 3rd night as needed only not on a daily routine  Lipid Assessment/Plan:      Based on NCEP/ATP III, the patient's risk factor category is "2 or more risk factors and a calculated 10 year CAD risk of > 20%".  The patient's lipid goals are as follows: Total cholesterol goal is 200; LDL cholesterol goal is 130; HDL cholesterol goal is 40; Triglyceride goal is 150.  Her LDL cholesterol goal has not been met.  Secondary causes for hyperlipidemia have been ruled out.  She has been counseled on adjunctive measures for lowering her cholesterol and has been provided with dietary instructions.    Patient Instructions: 1)  Read ALL food & drink labels. Consume LESS THAN 25 grams of sugar from those with High Fructose Corn Syrup as #1, 2 or 3 3 on label.Yams, wild rice, wheat pasta & whole grained breads instead of white high glycemic carbs. 2)  Please schedule a follow-up appointment in 6 months. 3)  TSH , free T4, free T3, prior to visit, ICD-9: 794.5 4)  HbgA1C prior to visit, ICD-9:790.29; vitamin D level ; 268.9. 5)  Please schedule a follow-up fasting Lab  appointment in 3 months for lipids, CPK  & hepatic panel(272.4, 995.20).

## 2010-09-23 NOTE — Letter (Signed)
Summary: [facility]  [facility]   Imported By: Lanelle Bal 07/18/2010 15:21:51  _____________________________________________________________________  External Attachment:    Type:   Image     Comment:   External Document

## 2010-09-25 NOTE — Assessment & Plan Note (Signed)
Summary: INGESTION//PH   Vital Signs:  Patient profile:   55 year old female Weight:      165.8 pounds BMI:     29.01 Temp:     98.5 degrees F oral Pulse rate:   92 / minute Resp:     14 per minute BP sitting:   124 / 76  (left arm) Cuff size:   large  Vitals Entered By: Shonna Chock CMA (August 06, 2010 9:10 AM) CC: 1.) Indigestion, patient states that she is under a lot of stress and questions if that is related to indigestion.  2.) Refill meds and discuss meds (removed crestor and vit d 50, 000 from med list), Depressive symptoms, Insomnia, Heartburn   CC:  1.) Indigestion, patient states that she is under a lot of stress and questions if that is related to indigestion.  2.) Refill meds and discuss meds (removed crestor and vit d 50, 000 from med list), Depressive symptoms, Insomnia, and Heartburn.  History of Present Illness:      This is a 55 year old woman who presents with  major life stressors ( job, relationship, financial, legal).  The patient reports loss of interest/pleasure, significant weight gain of 8# ("stress eating"), and insomnia, but denies depressed mood and hypersomnia.  The patient also reports fatigue or loss of energy and diminished concentration.  The patient denies feelings of worthlessness and thoughts of suicide.  The patient reports the following psychosocial stressors: major life changes.  The patient reports frequent awakening and  ? apnea noted  by other, but denies difficulty falling asleep, nightmares, and leg movements.  Behaviors that may contribute to insomnia include reading in bed.  She never took Citalopram.    "Indigestion":  The patient denies sour taste in mouth, epigastric pain, and trouble swallowing.  The patient denies the following alarm features: melena, dysphagia, hematemesis, and vomiting.  The patient has found the following treatments to be effective: an antacid (TUMS).    Hypertension Follow-Up: The patient reports edema, but denies  lightheadedness, urinary frequency, and headaches.  Associated symptoms include dyspnea.  The patient denies the following associated symptoms: chest pain, chest pressure, and palpitations.  Compliance with medications (by patient report) has been near 100%.  The patient reports that dietary compliance has been poor.  The patient reports no exercise.  Adjunctive measures currently used by the patient include salt restriction.    Current Medications (verified): 1)  Lorazepam 0.5 Mg Tabs (Lorazepam) .... Take 1 Tab  Q 8 Hrs As Needed 2)  Toprol Xl 25 Mg Xr24h-Tab (Metoprolol Succinate) .Marland Kitchen.. 1 Once Daily 3)  Zolpidem Tartrate 5 Mg Tabs (Zolpidem Tartrate) .Marland Kitchen.. 1 By Mouth Every 3rd Night As Needed Only Not On A Daily Routine  Allergies (verified): No Known Drug Allergies  Physical Exam  General:  well-nourished;alert,appropriate and cooperative throughout examination Eyes:  No corneal or conjunctival inflammation noted.No lid lag Neck:  No deformities, masses, or tenderness noted. Lungs:  Normal respiratory effort, chest expands symmetrically. Lungs are clear to auscultation, no crackles or wheezes. Heart:  Normal rate and regular rhythm. S1 and S2 normal without gallop, murmur, click, rub or other extra sounds. Pulses:  R and L carotid,radial,dorsalis pedis and posterior tibial pulses are full and equal bilaterally Extremities:  No clubbing, cyanosis, edema. Neurologic:  alert & oriented X3 and DTRs symmetrical and normal.   Skin:  Intact without suspicious lesions or rashes Psych:  memory intact for recent and remote, normally interactive, good eye contact,  not anxious appearing, and not depressed appearing.     Impression & Recommendations:  Problem # 1:  FATIGUE (ICD-780.79)  Orders: Venipuncture (16109) TLB-TSH (Thyroid Stimulating Hormone) (84443-TSH) TLB-CBC Platelet - w/Differential (85025-CBCD)  Problem # 2:  SLEEP DISORDER (ICD-780.50) R/O Sleep Apnea  Problem # 3:   HYPERTENSION, ESSENTIAL NOS (ICD-401.9)  controlled Her updated medication list for this problem includes:    Toprol Xl 25 Mg Xr24h-tab (Metoprolol succinate) .Marland Kitchen... 1 once daily  Orders: Venipuncture (60454) TLB-Creatinine, Blood (82565-CREA) TLB-Potassium (K+) (84132-K) TLB-BUN (Urea Nitrogen) (84520-BUN) TLB-CBC Platelet - w/Differential (85025-CBCD)  Problem # 4:  GERD (ICD-530.81)  Her updated medication list for this problem includes:    Ranitidine Hcl 150 Mg Tabs (Ranitidine hcl) .Marland Kitchen... 1 two times a day  pre meals as needed  Problem # 5:  ANXIETY (ICD-300.00)  The following medications were removed from the medication list:    Citalopram Hydrobromide 20 Mg Tabs (Citalopram hydrobromide) .Marland Kitchen... 1 once daily Her updated medication list for this problem includes:    Lorazepam 0.5 Mg Tabs (Lorazepam) .Marland Kitchen... Take 1 tab  q 8 hrs as needed    Citalopram Hydrobromide 20 Mg Tabs (Citalopram hydrobromide) .Marland Kitchen... 1 once daily  Complete Medication List: 1)  Lorazepam 0.5 Mg Tabs (Lorazepam) .... Take 1 tab  q 8 hrs as needed 2)  Toprol Xl 25 Mg Xr24h-tab (Metoprolol succinate) .Marland Kitchen.. 1 once daily 3)  Zolpidem Tartrate 5 Mg Tabs (Zolpidem tartrate) .Marland Kitchen.. 1 by mouth every 3rd night as needed only not on a daily routine 4)  Citalopram Hydrobromide 20 Mg Tabs (Citalopram hydrobromide) .Marland Kitchen.. 1 once daily 5)  Ranitidine Hcl 150 Mg Tabs (Ranitidine hcl) .Marland Kitchen.. 1 two times a day  pre meals as needed  Patient Instructions: 1)  Consider The [employer] low carb program. 2)  It is important that you exercise regularly at least 20 minutes 5 times a week. If you develop chest pain, have severe difficulty breathing, or feel very tired , stop exercising immediately and seek medical attention. 3)  Avoid foods high in acid (tomatoes, citrus juices, spicy foods). Avoid eating within two hours of lying down or before exercising. Do not over eat; try smaller more frequent meals. Elevate head of bed twelve  inches when sleeping. Prescriptions: RANITIDINE HCL 150 MG TABS (RANITIDINE HCL) 1 two times a day  pre meals as needed  #180 x 1   Entered and Authorized by:   Marga Melnick MD   Signed by:   Marga Melnick MD on 08/06/2010   Method used:   Print then Give to Patient   RxID:   [id number] CITALOPRAM HYDROBROMIDE 20 MG TABS (CITALOPRAM HYDROBROMIDE) 1 once daily  #90 x 1   Entered and Authorized by:   Marga Melnick MD   Signed by:   Marga Melnick MD on 08/06/2010   Method used:   Print then Give to Patient   RxID:   [id number] ZOLPIDEM TARTRATE 5 MG TABS (ZOLPIDEM TARTRATE) 1 by mouth every 3rd night as needed ONLY not on a daily Routine  #30 x 1   Entered and Authorized by:   Marga Melnick MD   Signed by:   Marga Melnick MD on 08/06/2010   Method used:   Print then Give to Patient   RxID:   [id number] TOPROL XL 25 MG XR24H-TAB (METOPROLOL SUCCINATE) 1 once daily  #90 Each x 3   Entered and Authorized by:   Marga Melnick MD   Signed by:  Marga Melnick MD on 08/06/2010   Method used:   Print then Give to Patient   RxID:   [id number] LORAZEPAM 0.5 MG TABS (LORAZEPAM) take 1 tab  q 8 hrs as needed  #30 x 5   Entered and Authorized by:   Marga Melnick MD   Signed by:   Marga Melnick MD on 08/06/2010   Method used:   Print then Give to Patient   RxID:   [id number]    Orders Added: 1)  Est. Patient Level IV [84696] 2)  Venipuncture [29528] 3)  TLB-TSH (Thyroid Stimulating Hormone) [84443-TSH] 4)  TLB-Creatinine, Blood [82565-CREA] 5)  TLB-Potassium (K+) [84132-K] 6)  TLB-BUN (Urea Nitrogen) [84520-BUN] 7)  TLB-CBC Platelet - w/Differential [85025-CBCD]

## 2010-10-06 ENCOUNTER — Encounter: Payer: Self-pay | Admitting: Internal Medicine

## 2010-10-23 ENCOUNTER — Encounter: Payer: Self-pay | Admitting: Internal Medicine

## 2010-11-27 ENCOUNTER — Encounter: Payer: Self-pay | Admitting: Internal Medicine

## 2010-11-27 ENCOUNTER — Ambulatory Visit (INDEPENDENT_AMBULATORY_CARE_PROVIDER_SITE_OTHER): Payer: BC Managed Care – PPO | Admitting: Internal Medicine

## 2010-11-27 DIAGNOSIS — I10 Essential (primary) hypertension: Secondary | ICD-10-CM

## 2010-11-27 DIAGNOSIS — R635 Abnormal weight gain: Secondary | ICD-10-CM

## 2010-11-27 DIAGNOSIS — R002 Palpitations: Secondary | ICD-10-CM

## 2010-11-27 LAB — TSH: TSH: 0.87 u[IU]/mL (ref 0.35–5.50)

## 2010-11-27 LAB — BASIC METABOLIC PANEL
Chloride: 101 mEq/L (ref 96–112)
Creatinine, Ser: 0.7 mg/dL (ref 0.4–1.2)
GFR: 99.99 mL/min (ref >60.00)

## 2010-11-27 MED ORDER — METOPROLOL SUCCINATE ER 25 MG PO TB24: 50.0000 mg | ORAL_TABLET | Freq: Every day | ORAL | Status: DC

## 2010-11-27 MED ORDER — AMLODIPINE BESYLATE 2.5 MG PO TABS: 5.0000 mg | ORAL_TABLET | Freq: Every day | ORAL | Status: DC

## 2010-11-27 NOTE — Patient Instructions (Signed)
Please limit  sodium intake to @ least  less than then 4 g (4000 mg). Monitor the blood pressure ;your goal is  a value of less than 135/85 on average.                                                                                  Preventive Health Care: Exercise  30-45  minutes a day, 3-4 days a week. Walking is especially valuable in preventing Osteoporosis. Eat a low-fat diet with lots of fruits and vegetables, up to 7-9 servings per day. Avoid obesity; your goal = waist less than 35 inches.Consume less than 30 grams of sugar per day from foods & drinks with High Fructose Corn Syrup as #2,3 or #4 on label. Alcohol If you drink, do it moderately - 1 drink per day or less.

## 2010-11-27 NOTE — Progress Notes (Signed)
  Subjective:    Patient ID: Desiree Andrews, female    DOB: 1956-05-26, 55 y.o.   MRN: [id number]  HPI HYPERTENSION: On 02/03 she was flushed & had diffuse , dull headache ;BP was 156/104. No associated chest pain or diarrhea. Disease Monitoring Blood pressure range-122/84- 180/117. Chest pain- no      Dyspnea- no DOE or PNDyspnea Medications Compliance- she has increased her metoprolol 25 mg daily this week. Lightheadedness- no   Edema- no     Review of Systems intermittent palpitations; she has 4-5 Diet Cokes/ day.She also has had increased sodium intake from food & drinks.Increased work stress is a component. No CVE; job is sedentary.     Objective:   Physical Exam General appearance is one of good health and nourishment. Skull is normocephalic without lymphadenopathy about the head, neck, or axilla. See current vital signs Eye - Pupils Equal Round Reactive to light, Extraocular movements intact, Fundi without hemorrhage or visible lesions, Conjunctiva without redness or discharge.No lid lag.Heart:  Normal rate and regular rhythm. S1 and S2 normal without gallop, murmur, click, rub or other extra sounds.   All pulses intact; no bruits.No NVD or HJR.                                                                                                                                                     Lungs:Chest clear to auscultation; no wheezes, rhonchi,rales ,or rubs present.No increased work of breathing.  Psych:  Cognition and judgment appear intact. Alert, communicative  and cooperative with normal attention span and concentration. Expresses frustration by weight gain.Nail health is good. There are  no significant deformities of the digits. No cyanosis, clubbing or edema are present.         Assessment & Plan:  #1 hypertension, poorly controlled. This is in this setting of increased caffeine and sodium intake.  #2 palpitations

## 2010-12-15 ENCOUNTER — Encounter: Payer: Self-pay | Admitting: Internal Medicine

## 2010-12-15 ENCOUNTER — Telehealth: Payer: Self-pay | Admitting: Internal Medicine

## 2010-12-15 NOTE — Telephone Encounter (Signed)
This is totally up to the patient. We are fine with either decision

## 2010-12-15 NOTE — Telephone Encounter (Signed)
Patient missed CPX today  Monday 12/15/2010----has rescheduled for 02/24/2011----does Dr Alwyn Ren want her to schedule her labs now or wait until the week before her 7/3 appt??    I will need orders and codes no matter when she comes in.       Thanks

## 2010-12-15 NOTE — Telephone Encounter (Signed)
[facility] panel (1304X) 10-14 days prior to 07/03 exam(V70.0, 272.4, 995.20,401.9)

## 2010-12-17 NOTE — Telephone Encounter (Signed)
Patient made fasting lab appointment for 02/03/2011

## 2010-12-17 NOTE — Telephone Encounter (Signed)
LMOM for patient to call back and sched [facility] labs 3 weeks before her 7/3 physical with Dr Alwyn Ren

## 2011-01-09 NOTE — Op Note (Signed)
[place]  Patient:    Desiree Andrews, Desiree Andrews Visit Number: [id number] MRN: [id number]          Service Type: DSU Location: [facility] Attending Physician:  Janalyn Rouse Dictated by:   Rose Phi. Maple Hudson, M.D. Proc. Date: 03/13/02 Admit Date:  03/13/2002 Discharge Date: 03/13/2002   CC:         Titus Dubin. Alwyn Ren, M.D. 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  OPERATION PERFORMED: 1. Blue dye injection. 2. Left partial mastectomy with specimen mammography and needle localization. 3. Left axillary sentinel lymph node biopsy.  SURGEON:  Rose Phi. Maple Hudson, M.D.  ANESTHESIA:  General.  DESCRIPTION OF PROCEDURE:  Prior to coming to the operating room the patient had a wire localization of the lesion in the upper inner quadrant of her left breast previously diagnosed as malignant on a core biopsy.  In addition, 1 mCi of technetium sulfur colloid was injected intradermally in the periareolar area.  After suitable general anesthesia was induced, the patient was placed in the supine position with the arms extended on the arm boards.  5 cc of Lymphazurin blue was injected in the subareolar tissue and the breast massaged for about three minutes.  After prepping and draping, a curved incision in the upper inner quadrant of her left breast using the wire as a guide was made and a wide excision of the wire and surrounding tissue was carried out.  Hemostasis was obtained with a cautery.  Specimen oriented for the pathologist and submitted to the radiologist for specimen mammography and then to the pathologist for Touch Preps for margins.  While those were being done, a short transverse left axillary incision was made with dissection through the subcutaneous to the clavipectoral fascia.  We identified a very hot and very blue lymph node which had counts  in excess of 1000.  It was removed with the lymphatics being clipped.  Following removal of this there were no palpable nodes, nor any hot or blue nodes.  This was then submitted to the pathologist for Touch Preps for metastatic disease.  While the tissue was being evaluated, the incisions were closed with 3-0 Vicryl and subcuticular 4-0 Monocryl and Steri-Strips.  Specimen mammography confirmed the removal of the lesion.  Touch Preps of the specimen revealed clean margins.  Touch Preps of the sentinel node were negative.  Dressings were then applied.  The patient was transferred to the recovery room in satisfactory condition having tolerated the procedure well. Dictated by:   Rose Phi. Maple Hudson, M.D. Attending Physician:  Janalyn Rouse DD:  03/13/02 TD:  03/15/02 Job: [id number]

## 2011-01-09 NOTE — Op Note (Signed)
[name], Desiree Andrews NO.:  [id number]   MEDICAL RECORD NO.:  [id number]          PATIENT TYPE:  AMB   LOCATION:  [facility]                         FACILITY:  [facility]   PHYSICIAN:  Sherry A. Dickstein, M.D.DATE OF BIRTH:  11-01-1955   DATE OF PROCEDURE:  06/11/2004  DATE OF DISCHARGE:                                 OPERATIVE REPORT   PREOPERATIVE DIAGNOSES:  1.  Postmenopausal bleeding.  2.  Thickened endometrium.   POSTOPERATIVE DIAGNOSES:  1.  Postmenopausal bleeding.  2.  Thickened endometrium.   PROCEDURES:  Dilatation and curettage and hysteroscopy with resectoscope.   SURGEON:  Sherry A. Rosalio Macadamia, M.D.   ANESTHESIA:  General.   INDICATIONS:  This is a 55 year old, G4, P2-0-2-2 woman who has had  postmenopausal bleeding.  The patient was evaluated with an ultrasound which  revealed thickened endometrium.  Because of the difficulty in evaluating  what was in the endometrial cavity, a sonohysterogram was performed.  Sonohysterogram revealed a hyperechoic mass approximately 1 cm in the  endometrial cavity. Because of this, the patient was brought to the  operating room for [facility] and hysteroscopy with resectoscope.   FINDINGS:  Normal sized retroverted uterus.  No adnexal  mass.  Essentially  normal endometrial cavity.  No distinct mass seen.  Friable endometrial  tissue.   DESCRIPTION OF PROCEDURE:  The patient was brought into the operating room  and given adequate IV sedation.  She was placed in the dorsolithotomy  position.  Her perineum was washed with Hibiclens.  Pelvic examination was  performed.  The patient was draped in a sterile fashion.  Speculum was  placed with the vagina.  The vagina was washed with Hibiclens.  Paracervical  block was administered with 1% Nesacaine.  The anterior lip of the cervix  was grasped with a single-tooth tenaculum.  The cervix was sounded.  The  cervix was dilated with Pratt dilators to a #31.  During dilatation,  the  patient became more uncomfortable and anesthesia required general anesthesia  at this time.  Therefore, the patient was completely sedated and the  procedure was continued.  The hysteroscope was introduced into the  endometrial cavity.  Pictures were obtained.  No distinct mass could be  determined, except for some irregularities in the endometrial cavity.  Using  a double loop right angle resector, sheaths of endometrial tissue were taken  circumferentially.  Adequate hemostasis was present.  All instruments were  removed from the vagina.  There was a small nodular area approximately 3-4  mm at approximately 2 o'clock in the vaginal vault approximately 6 cm into  the vagina.  This was visualized and Allis clamp was placed on it and it was  excised.  It was consistent with sebaceous material.  All instruments were  then removed from the vagina.  The patient was taken out of the  dorsolithotomy position.  She was awakened.  She was moved from the  operating table to a stretcher in stable condition.   COMPLICATIONS:  None.   ESTIMATED BLOOD LOSS:  Less than 5 mL.   SORBITOL DIFFERENTIAL:  -  20 mL.      SAD/MEDQ  D:  06/11/2004  T:  06/11/2004  Job:  [id number]

## 2011-02-03 ENCOUNTER — Other Ambulatory Visit (INDEPENDENT_AMBULATORY_CARE_PROVIDER_SITE_OTHER): Payer: BC Managed Care – PPO

## 2011-02-03 DIAGNOSIS — Z7901 Long term (current) use of anticoagulants: Secondary | ICD-10-CM

## 2011-02-03 NOTE — Progress Notes (Signed)
Labs only

## 2011-02-17 ENCOUNTER — Encounter: Payer: Self-pay | Admitting: Internal Medicine

## 2011-02-24 ENCOUNTER — Encounter: Payer: Self-pay | Admitting: Internal Medicine

## 2011-03-04 ENCOUNTER — Other Ambulatory Visit: Payer: Self-pay | Admitting: Internal Medicine

## 2011-04-06 ENCOUNTER — Ambulatory Visit: Payer: Self-pay

## 2011-04-13 ENCOUNTER — Ambulatory Visit: Payer: BC Managed Care – PPO

## 2011-05-06 ENCOUNTER — Ambulatory Visit (INDEPENDENT_AMBULATORY_CARE_PROVIDER_SITE_OTHER): Payer: BC Managed Care – PPO | Admitting: Internal Medicine

## 2011-05-06 ENCOUNTER — Encounter: Payer: Self-pay | Admitting: Internal Medicine

## 2011-05-06 VITALS — BP 126/88 | HR 99 | Temp 98.2°F | Resp 12 | Ht 63.25 in

## 2011-05-06 DIAGNOSIS — Z0000 Encounter for general adult medical examination without abnormal findings: Secondary | ICD-10-CM

## 2011-05-06 DIAGNOSIS — Z23 Encounter for immunization: Secondary | ICD-10-CM

## 2011-05-06 DIAGNOSIS — I10 Essential (primary) hypertension: Secondary | ICD-10-CM

## 2011-05-06 DIAGNOSIS — M899 Disorder of bone, unspecified: Secondary | ICD-10-CM

## 2011-05-06 DIAGNOSIS — D0590 Unspecified type of carcinoma in situ of unspecified breast: Secondary | ICD-10-CM

## 2011-05-06 DIAGNOSIS — M949 Disorder of cartilage, unspecified: Secondary | ICD-10-CM

## 2011-05-06 DIAGNOSIS — E782 Mixed hyperlipidemia: Secondary | ICD-10-CM

## 2011-05-06 MED ORDER — TETANUS-DIPHTH-ACELL PERTUSSIS 5-2.5-18.5 LF-MCG/0.5 IM SUSP
0.5000 mL | Freq: Once | INTRAMUSCULAR | Status: AC
  Administered 2011-05-06: 0.5 mL via INTRAMUSCULAR

## 2011-05-06 MED ORDER — ZOLPIDEM TARTRATE 5 MG PO TABS: ORAL_TABLET | ORAL | Status: DC

## 2011-05-06 MED ORDER — LORAZEPAM 0.5 MG PO TABS: 0.5000 mg | ORAL_TABLET | Freq: Three times a day (TID) | ORAL | Status: DC

## 2011-05-06 MED ORDER — PRAVASTATIN SODIUM 20 MG PO TABS: 20.0000 mg | ORAL_TABLET | Freq: Every evening | ORAL | Status: DC

## 2011-05-06 NOTE — Patient Instructions (Signed)
Eat a low-fat diet with lots of fruits and vegetables, up to 7-9 servings per day. Avoid obesity; your goal is waist measurement < 40 inches.Consume less than 40 grams of sugar per day from foods & drinks with High Fructose Corn Sugar as #1,2,3 or # 4 on label. Follow the low carb nutrition program in [employer] as closely as possible to prevent Diabetes progression & complications. White carbohydrates (potatoes, rice, bread, and pasta) have a high spike of sugar and a high load of sugar. For example a  baked potato has a cup of sugar and a  french fry  2 teaspoons of sugar. Yams, wild  rice, whole grained bread &  wheat pasta have been much lower spike and load of  sugar. Portions should be the size of a deck of cards or your palm.  Preventive Health Care: Exercise  30-45  minutes a day, 3-4 days a week. Walking is especially valuable in preventing Osteoporosis. Health Care Power of Attorney & Living Will place you in charge of your health care  decisions. Verify these are  in place. Please  schedule fasting Labs in 10 weeks : CK,Lipids, hepatic panel, vit D level ( 272.4,995.20, 268.9) .

## 2011-05-06 NOTE — Progress Notes (Signed)
Subjective:    Patient ID: Desiree Andrews, female    DOB: 1956/02/07, 55 y.o.   MRN: [id number]  HPI  Desiree Andrews  is here for a physical;acute issues include life stresses.      Review of Systems #1  HYPERTENSION: Disease Monitoring: Blood pressure range-120s-130s/90s  Chest pain, palpitations- no       Dyspnea- no Medications: Compliance- yes  Lightheadedness,Syncope- no    Edema- no  #2 Fasting HYPERGLYCEMIA: FBS up to 107 Disease Monitoring: Blood Sugar ranges-no monitor  Polyuria/phagia/dipsia- no      Visual problems- no Medications: Compliance- no meds  Diet: no plan A1c : 5.7% but insulin 23  #3 HYPERLIPIDEMIA: Disease Monitoring: See symptoms for Hypertension Medications: Compliance- no statin  Dyslipidemia assessment: Prior Advanced Lipid Testing: NMR LDL goal = < 100.   Family history of premature CAD/ MI: no .  Exercise: walking 2.5 mpd 4X/  week . Lab results reviewed :[place] Heart Panel:TG 324; insulin 23; FBS 107; LDL 193. T/T Genotype ; not hyperabsorber..      ROS:Abd pain, bowel changes- no   Muscle aches- no PMH Smoking Status : quit 1992        Objective:   Physical Exam Gen.: Healthy and well-nourished in appearance. Alert, appropriate and cooperative throughout exam. Head: Normocephalic without obvious abnormalities  Eyes: No corneal or conjunctival inflammation noted. Pupils equal round reactive to light and accommodation. Fundal exam is benign without hemorrhages, exudate, papilledema. Extraocular motion intact. Vision grossly normal.Slight ptosis OS Ears: External  ear exam reveals no significant lesions or deformities. Canals clear .TMs normal. Hearing is grossly normal bilaterally. Nose: External nasal exam reveals no deformity or inflammation. Nasal mucosa are pink and moist. No lesions or exudates noted.  Mouth: Oral mucosa and oropharynx reveal no lesions or exudates. Teeth in good repair. Neck: No deformities, masses, or tenderness  noted. Range of motion & . Thyroid normal. Lungs: Normal respiratory effort; chest expands symmetrically. Lungs are clear to auscultation without rales, wheezes, or increased work of breathing. Heart: Normal rate and rhythm. Normal S1 and S2. No gallop, click, or rub. S4 w/o murmur. Abdomen: Bowel sounds normal; abdomen soft and nontender. No masses, organomegaly or hernias noted. Genitalia: as per Gyn   .                                                                                   Musculoskeletal/extremities: No deformity or scoliosis noted of  the thoracic or lumbar spine. No clubbing, cyanosis, edema, or deformity noted. Range of motion  normal . Minor crepitus of knees. Tone & strength normal.Joints normal. Nail health  good. Vascular: Carotid, radial artery, dorsalis pedis and  posterior tibial pulses are full and equal. No bruits present. Neurologic: Alert and oriented x3. Deep tendon reflexes symmetrical and normal.          Skin: Intact without suspicious lesions or rashes. Lymph: No cervical, axillary   lymphadenopathy present. Psych: Mood and affect are normal. Normally interactive  Assessment & Plan:  #1 comprehensive physical exam; no acute findings #2 see Problem List with Assessments & Recommendations  #3 dyslipidemia; specific risks outlined above. A major dietary component is present; this is compatible with metabolic syndrome or prediabetes. At this time her A1c is normal. Plan: see Orders  EKG reviewed ; no acute changes.

## 2011-05-21 ENCOUNTER — Ambulatory Visit (INDEPENDENT_AMBULATORY_CARE_PROVIDER_SITE_OTHER): Payer: BC Managed Care – PPO | Admitting: Internal Medicine

## 2011-05-21 ENCOUNTER — Encounter: Payer: Self-pay | Admitting: Internal Medicine

## 2011-05-21 DIAGNOSIS — J209 Acute bronchitis, unspecified: Secondary | ICD-10-CM

## 2011-05-21 DIAGNOSIS — J069 Acute upper respiratory infection, unspecified: Secondary | ICD-10-CM

## 2011-05-21 MED ORDER — AZITHROMYCIN 250 MG PO TABS: ORAL_TABLET | ORAL | Status: AC

## 2011-05-21 MED ORDER — HYDROCODONE-HOMATROPINE 5-1.5 MG/5ML PO SYRP: 5.0000 mL | ORAL_SOLUTION | Freq: Four times a day (QID) | ORAL | Status: AC | PRN

## 2011-05-21 NOTE — Progress Notes (Signed)
  Subjective:    Patient ID: Desiree Andrews, female    DOB: 23-Mar-1956, 55 y.o.   MRN: [id number]  HPI Respiratory tract infection Onset/symptoms:9/18 as NP cough Exposures (illness/environmental/extrinsic):co workers ill Progression of symptoms:rhinitis & chest / head congestion Treatments/response:Mucinex , dayquil Present symptoms: Fever/chills/sweats:no Frontal headache:no Facial pain:no Nasal purulence:scant yellow Sore throat:no Dental pain:no Lymphadenopathy:no Wheezing/shortness of breath:no Cough/sputum/hemoptysis:still dry Pleuritic pain:no Associated extrinsic/allergic symptoms:itchy eyes/ sneezing:no Past medical history: Seasonal allergies/asthma:no Smoking history:quit 1990           Review of Systems     Objective:   Physical Exam General appearance is of good health and nourishment; no acute distress or increased work of breathing is present.  No  lymphadenopathy about the head, neck, or axilla noted.   Eyes: No conjunctival inflammation or lid edema is present.  Ears:  External ear exam shows no significant lesions or deformities.  Otoscopic examination reveals clear canals, tympanic membranes are intact bilaterally without bulging, retraction, inflammation or discharge.  Nose:  External nasal examination shows no deformity or inflammation. Nasal mucosa are pink and moist without lesions or exudates. No septal dislocation or dislocation.No obstruction to airflow. Hyponasal speech  Oral exam: Dental hygiene is good; lips and gums are healthy appearing.There is no oropharyngeal erythema or exudate noted.   Heart:  Normal rate and regular rhythm. S1 and S2 normal without gallop, murmur, click, rub.S4 Lungs:Chest clear to auscultation; no wheezes, rhonchi,rales ,or rubs present.No increased work of breathing.    Extremities:  No cyanosis, edema, or clubbing  noted    Skin: Warm & dry           Assessment & Plan:

## 2011-05-21 NOTE — Patient Instructions (Signed)
Plain Mucinex for thick secretions ;force NON dairy fluids for next 48 hrs. Use a Neti pot daily as needed for sinus congestion . Nasonex 1 spray in each nostril twice a day as needed. Use the "crossover" technique as discussed

## 2011-06-10 ENCOUNTER — Ambulatory Visit
Admission: RE | Admit: 2011-06-10 | Discharge: 2011-06-10 | Disposition: A | Discharge status: Home / Self Care | Payer: BC Managed Care – PPO | Source: Ambulatory Visit | Attending: Oncology | Admitting: Oncology

## 2011-06-10 DIAGNOSIS — Z1239 Encounter for other screening for malignant neoplasm of breast: Secondary | ICD-10-CM

## 2011-06-11 ENCOUNTER — Other Ambulatory Visit: Payer: Self-pay | Admitting: Oncology

## 2011-06-11 DIAGNOSIS — R928 Other abnormal and inconclusive findings on diagnostic imaging of breast: Secondary | ICD-10-CM

## 2011-06-23 ENCOUNTER — Ambulatory Visit
Admission: RE | Admit: 2011-06-23 | Discharge: 2011-06-23 | Disposition: A | Discharge status: Home / Self Care | Payer: BC Managed Care – PPO | Source: Ambulatory Visit | Attending: Oncology | Admitting: Oncology

## 2011-06-23 ENCOUNTER — Other Ambulatory Visit: Payer: Self-pay | Admitting: Oncology

## 2011-06-23 DIAGNOSIS — R928 Other abnormal and inconclusive findings on diagnostic imaging of breast: Secondary | ICD-10-CM

## 2011-07-14 ENCOUNTER — Other Ambulatory Visit: Payer: Self-pay | Admitting: Internal Medicine

## 2011-07-14 DIAGNOSIS — T887XXA Unspecified adverse effect of drug or medicament, initial encounter: Secondary | ICD-10-CM

## 2011-07-14 DIAGNOSIS — E559 Vitamin D deficiency, unspecified: Secondary | ICD-10-CM

## 2011-07-14 DIAGNOSIS — E785 Hyperlipidemia, unspecified: Secondary | ICD-10-CM

## 2011-07-15 ENCOUNTER — Other Ambulatory Visit: Payer: BC Managed Care – PPO

## 2011-10-03 ENCOUNTER — Other Ambulatory Visit: Payer: Self-pay | Admitting: Internal Medicine

## 2011-10-24 ENCOUNTER — Other Ambulatory Visit: Payer: Self-pay | Admitting: Internal Medicine

## 2011-10-26 NOTE — Telephone Encounter (Signed)
Last appt 05/21/11, okay to refill?

## 2011-10-26 NOTE — Telephone Encounter (Signed)
Rx called to pharmacy, spoke with Abby, pharmacist.

## 2011-10-26 NOTE — Telephone Encounter (Signed)
Ok #30, 1 RF 

## 2011-12-21 ENCOUNTER — Other Ambulatory Visit: Payer: Self-pay | Admitting: Internal Medicine

## 2011-12-28 ENCOUNTER — Ambulatory Visit (INDEPENDENT_AMBULATORY_CARE_PROVIDER_SITE_OTHER): Payer: BC Managed Care – PPO | Admitting: Family Medicine

## 2011-12-28 ENCOUNTER — Encounter: Payer: Self-pay | Admitting: Family Medicine

## 2011-12-28 VITALS — BP 138/88 | HR 90 | Temp 98.1°F | Wt 163.2 lb

## 2011-12-28 DIAGNOSIS — L900 Lichen sclerosus et atrophicus: Secondary | ICD-10-CM

## 2011-12-28 DIAGNOSIS — L940 Localized scleroderma [morphea]: Secondary | ICD-10-CM

## 2011-12-28 DIAGNOSIS — L259 Unspecified contact dermatitis, unspecified cause: Secondary | ICD-10-CM

## 2011-12-28 MED ORDER — PREDNISONE 10 MG PO TABS: ORAL_TABLET | ORAL | Status: DC

## 2011-12-28 MED ORDER — METHYLPREDNISOLONE ACETATE 80 MG/ML IJ SUSP
80.0000 mg | Freq: Once | INTRAMUSCULAR | Status: AC
  Administered 2011-12-28: 80 mg via INTRAMUSCULAR

## 2011-12-28 MED ORDER — CLOTRIMAZOLE-BETAMETHASONE 1-0.05 % EX CREA: TOPICAL_CREAM | Freq: Two times a day (BID) | CUTANEOUS | Status: DC

## 2011-12-28 NOTE — Progress Notes (Signed)
  Subjective:     Desiree Andrews is a 56 y.o. female who presents for evaluation of a rash involving the chest and face. Rash started 1 day ago. Lesions are pink, and raised in texture. Rash has changed over time. Rash is pruritic. Associated symptoms: none. Patient denies: abdominal pain, arthralgia, congestion, cough, crankiness, decrease in appetite, decrease in energy level, fever, headache, irritability, myalgia, nausea, sore throat and vomiting. Patient has not had contacts with similar rash. Patient has had new exposures (soaps, lotions, laundry detergents, foods, medications, plants, insects or animals).  Pt also c/o hemorrhoids for weeks with some bleeding.  No constipation and + vaginal itching.  Pt has used prep H and monistat with no relief.   The following portions of the patient's history were reviewed and updated as appropriate: allergies, current medications, past family history, past medical history, past social history, past surgical history and problem list.  Review of Systems Pertinent items are noted in HPI.    Objective:    BP 138/88  Pulse 90  Temp(Src) 98.1 F (36.7 C) (Oral)  Wt 163 lb 3.2 oz (74.027 kg)  SpO2 98% General:  alert, cooperative, appears stated age and no distress  Skin:  papules noted on face and neck   vaginal/ rectum--- skin white plaques and irritation                              Skin tag right buttock  Assessment:    contact dermatitis: ? plants   lichen sclerosis and skin tag----- pt to see gyn Plan:    Medications: lotrisone and steroids: pred taper. Written patient instruction given. Follow up in several days.

## 2011-12-28 NOTE — Patient Instructions (Signed)
Lichen Sclerosus Lichen sclerosus is a skin problem. It can happen on any part of the body, but it commonly involves the anal or genital areas. Lichen sclerosus is not an infection or a fungus. Girls and women are more commonly affected than boys and men. CAUSES The cause is not known. It could be the result of an overactive immune system or a lack of certain hormones. Lichen sclerosus is not passed from one person to another (not contagious). SYMPTOMS Your skin may have:  Thin, wrinkled, white areas.   Thickened white areas.   Red and swollen patches.   Tears or cracks.   Bruising.   Blood blisters.   Severe itching.  You may also have pain, itching, or burning with urination. Constipation is also common in people with lichen sclerosus. DIAGNOSIS Your caregiver will do a physical exam. Sometimes, a tissue sample (biopsy) may be sent for testing. TREATMENT Treatment may involve putting a thin layer of medicated cream (topical steroid) over the areas with lichen sclerosus. Use the cream only as directed by your caregiver.  HOME CARE INSTRUCTIONS  Only take over-the-counter or prescription medicines as directed by your caregiver.   Keep the vaginal area as clean and dry as possible.  SEEK MEDICAL CARE IF: You develop increasing pain, swelling, or redness. Document Released: 12/31/2010 Document Revised: 07/30/2011 Document Reviewed: 12/31/2010 [employer] Patient Information 2012 [employer]. 

## 2011-12-31 ENCOUNTER — Telehealth: Payer: Self-pay | Admitting: Internal Medicine

## 2011-12-31 NOTE — Telephone Encounter (Signed)
Patient called and stated the rash is getting worse, she wants to know if it is suppose to do that before it gets better? She was in 5.6.13 dx  Contact dermatitis - Primary   692.9   Lichen sclerosus    701.0      She received a shot as well as prednisone and is not sure if she is over reacting or not. Please review and advise  Patient ph# [phone]

## 2011-12-31 NOTE — Telephone Encounter (Signed)
Call from patient and she stated she was seen on Monday and she stated she had a rash and she got a shot and has taken 2 days worth or prednisone and she is not improving and wanted to know what she should do. Dr.Lowne offered Dermatology referral and the patient declined. She wanted to see Dr.Hopper tomorrow. Apt scheduled     KP

## 2012-01-01 ENCOUNTER — Ambulatory Visit (INDEPENDENT_AMBULATORY_CARE_PROVIDER_SITE_OTHER): Payer: BC Managed Care – PPO | Admitting: Internal Medicine

## 2012-01-01 VITALS — BP 150/86 | HR 98 | Temp 98.4°F | Wt 163.0 lb

## 2012-01-01 DIAGNOSIS — L259 Unspecified contact dermatitis, unspecified cause: Secondary | ICD-10-CM

## 2012-01-01 MED ORDER — MOMETASONE FUROATE 0.1 % EX OINT: TOPICAL_OINTMENT | Freq: Two times a day (BID) | CUTANEOUS | Status: DC

## 2012-01-01 MED ORDER — RANITIDINE HCL 150 MG PO TABS: 150.0000 mg | ORAL_TABLET | Freq: Two times a day (BID) | ORAL | Status: DC

## 2012-01-01 MED ORDER — HYDROXYZINE HCL 10 MG PO TABS: 10.0000 mg | ORAL_TABLET | Freq: Three times a day (TID) | ORAL | Status: AC | PRN

## 2012-01-01 NOTE — Progress Notes (Signed)
  Subjective:    Patient ID: Desiree Andrews, female    DOB: 01-23-1956, 56 y.o.   MRN: [id number]  HPI The 12/28/11 record was reviewed. She feels that there has been slow response of the rash to the prednisone. She is now to start 20 mg daily for 2 days.  She denies itchy eyes, sneezing, wheezing or shortness of breath. She is not having fever, chills or sweats.  She believes that this occurs every year and questions a reaction to something blooming. She did eat some smoked salmon prior to the onset of the rash but this has not been a persistent trigger in the past.  The rash is pruritic; Zyrtec may have been of some benefit.   Review of Systems Tenderness: no New medications/antibiotics: no Tick/insect/pet exposure: no  Recent travel: no New detergent, new clothing, or other topical exposure: no  Feeling ill: no Mouth lesions:no Facial/tongue swelling/difficulty breathing: no   She is no longer on the H2 blocker ranitidine. She also stopped her pravastatin ; she felt it disturbed her sleep     Objective:   Physical Exam   She appears well-nourished and in no distress  She has a diffuse salmon-colored plaque-like dermatitis which is dry and raised mainly at the base of the neck and over the left lateral face. She has lesions at the waist as well. These lesions partially blanch with pressure. She exhibits dramatic dermatographia.  Extraocular motions intact; there is no conjunctivitis  Chest is clear without wheezing or increased work of breathing  She exhibits an S4 no tachycardia  She has no lymphadenopathy about the head neck or axilla.        Assessment & Plan:    #1 urticarial variant dermatitis  Plan see orders recommendations

## 2012-01-01 NOTE — Patient Instructions (Addendum)
Mix Eucerin 1 part to Mometasone  1 part and apply twice a day to itchy area of skin as needed .   Avoid perfumes and cosmetics which are not hypoallergenic. Restrict hyperallergenic foods at this time: Nuts, strawberries, seafood , chocolate, and tomatoes.

## 2012-01-08 ENCOUNTER — Telehealth: Payer: Self-pay | Admitting: Internal Medicine

## 2012-01-08 DIAGNOSIS — L259 Unspecified contact dermatitis, unspecified cause: Secondary | ICD-10-CM

## 2012-01-08 MED ORDER — PREDNISONE 10 MG PO TABS: ORAL_TABLET | ORAL | Status: DC

## 2012-01-08 NOTE — Telephone Encounter (Signed)
Please advise      KP 

## 2012-01-08 NOTE — Telephone Encounter (Signed)
Refill prednisone x1 but if it does not go away or comes back she will need to see a dermatologist.

## 2012-01-08 NOTE — Telephone Encounter (Signed)
Discussed with patient and she voiced understanding, she stated she thinks she will go ahead and see a dermatologist if this does not clear up.     KP

## 2012-01-08 NOTE — Telephone Encounter (Signed)
Msg left to call the office     KP 

## 2012-01-08 NOTE — Telephone Encounter (Signed)
Patient states she is getting a 2nd round of this rash and was wondering if you could call her in some more Prednisone to [facility] on [address]. Patient PH# [phone]  5.6.13 saw lowne dx Contact dermatitis - Primary 692.9 Lichen sclerosus 701.0  5.10.13 SAW pcp Dx Dermatitis, contact - Primary 692.9

## 2012-02-10 ENCOUNTER — Telehealth: Payer: Self-pay | Admitting: Internal Medicine

## 2012-02-10 DIAGNOSIS — E559 Vitamin D deficiency, unspecified: Secondary | ICD-10-CM

## 2012-02-10 NOTE — Telephone Encounter (Signed)
Future order for Vit D placed

## 2012-02-10 NOTE — Telephone Encounter (Signed)
Please get baseline vitamin D level to  assess appropriate dose of vitamin D supplementation. Once this is drawn; she should start 2000 international units of vitamin D 3 daily pending return of results

## 2012-02-10 NOTE — Telephone Encounter (Signed)
Dr.Hopper please advise, last Vit D level was 06/12/10 @ 31, ref range (30-89).

## 2012-02-10 NOTE — Telephone Encounter (Signed)
Pt states she can tell she has vit D deficiency again and would like Dr. Alwyn Ren to call her in the medication he usually gives her. She states she has requested this before. She uses Therapist, occupational on [address].

## 2012-02-10 NOTE — Telephone Encounter (Signed)
g

## 2012-02-10 NOTE — Telephone Encounter (Signed)
Pt coming in tomorrow at 8:30a.

## 2012-02-11 ENCOUNTER — Other Ambulatory Visit: Payer: BC Managed Care – PPO

## 2012-03-08 ENCOUNTER — Other Ambulatory Visit: Payer: Self-pay | Admitting: Internal Medicine

## 2012-03-09 NOTE — Telephone Encounter (Signed)
Per Dr.Hopper ok to fill medications. Medications were called in (print method selected in error)

## 2012-05-09 ENCOUNTER — Other Ambulatory Visit: Payer: Self-pay | Admitting: Internal Medicine

## 2012-05-10 NOTE — Telephone Encounter (Signed)
RX's called in  

## 2012-07-14 ENCOUNTER — Other Ambulatory Visit: Payer: Self-pay | Admitting: Internal Medicine

## 2012-07-26 ENCOUNTER — Other Ambulatory Visit: Payer: Self-pay | Admitting: Oncology

## 2012-07-26 DIAGNOSIS — Z1231 Encounter for screening mammogram for malignant neoplasm of breast: Secondary | ICD-10-CM

## 2012-08-29 ENCOUNTER — Ambulatory Visit
Admission: RE | Admit: 2012-08-29 | Discharge: 2012-08-29 | Disposition: A | Discharge status: Home / Self Care | Payer: 59 | Source: Ambulatory Visit | Attending: Oncology | Admitting: Oncology

## 2012-08-29 DIAGNOSIS — Z1231 Encounter for screening mammogram for malignant neoplasm of breast: Secondary | ICD-10-CM

## 2012-09-01 ENCOUNTER — Other Ambulatory Visit: Payer: Self-pay | Admitting: Oncology

## 2012-09-01 DIAGNOSIS — R928 Other abnormal and inconclusive findings on diagnostic imaging of breast: Secondary | ICD-10-CM

## 2012-09-02 ENCOUNTER — Ambulatory Visit
Admission: RE | Admit: 2012-09-02 | Discharge: 2012-09-02 | Disposition: A | Discharge status: Home / Self Care | Payer: 59 | Source: Ambulatory Visit | Attending: Oncology | Admitting: Oncology

## 2012-09-02 ENCOUNTER — Other Ambulatory Visit: Payer: Self-pay | Admitting: Oncology

## 2012-09-02 DIAGNOSIS — R928 Other abnormal and inconclusive findings on diagnostic imaging of breast: Secondary | ICD-10-CM

## 2012-09-02 DIAGNOSIS — R921 Mammographic calcification found on diagnostic imaging of breast: Secondary | ICD-10-CM

## 2012-09-02 DIAGNOSIS — N6310 Unspecified lump in the right breast, unspecified quadrant: Secondary | ICD-10-CM

## 2012-09-05 ENCOUNTER — Ambulatory Visit
Admission: RE | Admit: 2012-09-05 | Discharge: 2012-09-05 | Disposition: A | Discharge status: Home / Self Care | Payer: 59 | Source: Ambulatory Visit | Attending: Oncology | Admitting: Oncology

## 2012-09-05 ENCOUNTER — Other Ambulatory Visit: Payer: Self-pay | Admitting: Oncology

## 2012-09-05 DIAGNOSIS — R921 Mammographic calcification found on diagnostic imaging of breast: Secondary | ICD-10-CM

## 2012-09-05 DIAGNOSIS — R928 Other abnormal and inconclusive findings on diagnostic imaging of breast: Secondary | ICD-10-CM

## 2012-09-07 ENCOUNTER — Other Ambulatory Visit: Payer: Self-pay | Admitting: Oncology

## 2012-09-07 ENCOUNTER — Ambulatory Visit
Admission: RE | Admit: 2012-09-07 | Discharge: 2012-09-07 | Disposition: A | Discharge status: Home / Self Care | Payer: 59 | Source: Ambulatory Visit | Attending: Oncology | Admitting: Oncology

## 2012-09-07 DIAGNOSIS — R928 Other abnormal and inconclusive findings on diagnostic imaging of breast: Secondary | ICD-10-CM

## 2012-09-07 DIAGNOSIS — N6310 Unspecified lump in the right breast, unspecified quadrant: Secondary | ICD-10-CM

## 2012-09-09 ENCOUNTER — Ambulatory Visit
Admission: RE | Admit: 2012-09-09 | Discharge: 2012-09-09 | Disposition: A | Discharge status: Home / Self Care | Payer: 59 | Source: Ambulatory Visit | Attending: Oncology | Admitting: Oncology

## 2012-09-09 ENCOUNTER — Other Ambulatory Visit: Payer: Self-pay | Admitting: Oncology

## 2012-09-09 DIAGNOSIS — R928 Other abnormal and inconclusive findings on diagnostic imaging of breast: Secondary | ICD-10-CM

## 2012-09-09 DIAGNOSIS — N6310 Unspecified lump in the right breast, unspecified quadrant: Secondary | ICD-10-CM

## 2012-09-13 ENCOUNTER — Other Ambulatory Visit: Payer: Self-pay | Admitting: Dermatology

## 2012-09-14 ENCOUNTER — Other Ambulatory Visit: Payer: Self-pay | Admitting: Internal Medicine

## 2012-09-14 DIAGNOSIS — E559 Vitamin D deficiency, unspecified: Secondary | ICD-10-CM

## 2012-09-14 DIAGNOSIS — E785 Hyperlipidemia, unspecified: Secondary | ICD-10-CM

## 2012-09-14 DIAGNOSIS — Z0000 Encounter for general adult medical examination without abnormal findings: Secondary | ICD-10-CM

## 2012-09-14 DIAGNOSIS — I10 Essential (primary) hypertension: Secondary | ICD-10-CM

## 2012-09-15 NOTE — Telephone Encounter (Signed)
Patient aware Controlled Substance Contract to be sign and rx to be picked up   

## 2012-09-19 ENCOUNTER — Encounter (INDEPENDENT_AMBULATORY_CARE_PROVIDER_SITE_OTHER): Payer: Self-pay | Admitting: Surgery

## 2012-09-19 ENCOUNTER — Ambulatory Visit (INDEPENDENT_AMBULATORY_CARE_PROVIDER_SITE_OTHER): Payer: 59 | Admitting: Surgery

## 2012-09-19 VITALS — BP 148/70 | HR 92 | Resp 12 | Ht 63.0 in | Wt 162.6 lb

## 2012-09-19 DIAGNOSIS — Z853 Personal history of malignant neoplasm of breast: Secondary | ICD-10-CM

## 2012-09-19 DIAGNOSIS — N6089 Other benign mammary dysplasias of unspecified breast: Secondary | ICD-10-CM

## 2012-09-19 DIAGNOSIS — N6099 Unspecified benign mammary dysplasia of unspecified breast: Secondary | ICD-10-CM | POA: Insufficient documentation

## 2012-09-19 NOTE — Patient Instructions (Signed)
Return after MRI.  Send to genetics for evaluation.

## 2012-09-19 NOTE — Progress Notes (Signed)
Patient ID: Desiree Andrews, female   DOB: 30-Jul-1956, 57 y.o.   MRN: [id number]  Chief Complaint  Patient presents with  . New Evaluation    eval of rt ADH    HPI Desiree Andrews is a 57 y.o. female.  Patient sent a request of Dr. Chilton Si for right breast atypical ductal hyperplasia is obtained from core biopsy. History of stage I left breast cancer 2003 status post breast conservation by Theron Arista done. Denies any history of breast mass, breast pain or nipple discharge. Some cosmetic deformity of left breast. HPI  Past Medical History  Diagnosis Date  . Cardiopulmonary arrest 1991    Postpartum,? Pulmonary thromboembolism  . Cancer     breast  . Hyperlipidemia   . Hypertension     Past Surgical History  Procedure Date  . Cardiovascular stress test 2005    Neg  . Breast lumpectomy     Left, Dr.Rubin; Radiation & Chemotherapy X3  . Colonoscopy 2006    Normal  . Wrist surgery     Left, Dr.Rowen    Family History  Problem Relation Age of Onset  . Diabetes Mother   . Hypertension Mother   . Hyperlipidemia Mother   . Stroke Mother 27    02/2009  . Heart attack Maternal Grandmother     in her 54s  . Stroke Maternal Grandmother     Social History History  Substance Use Topics  . Smoking status: Former Smoker    Quit date: 08/24/1990  . Smokeless tobacco: Never Used  . Alcohol Use: 8.4 oz/week    14 Glasses of wine per week     Comment: Socially    Allergies  Allergen Reactions  . Pravastatin     Sleep disturbance    Current Outpatient Prescriptions  Medication Sig Dispense Refill  . metoprolol succinate (TOPROL-XL) 25 MG 24 hr tablet TAKE ONE TABLET BY MOUTH DAILY  90 tablet  1    Review of Systems Review of Systems  Constitutional: Negative for fever, chills and unexpected weight change.  HENT: Negative for hearing loss, congestion, sore throat, trouble swallowing and voice change.   Eyes: Negative for visual disturbance.  Respiratory: Negative for cough  and wheezing.   Cardiovascular: Negative for chest pain, palpitations and leg swelling.  Gastrointestinal: Negative for nausea, vomiting, abdominal pain, diarrhea, constipation, blood in stool, abdominal distention and anal bleeding.  Genitourinary: Negative for hematuria, vaginal bleeding and difficulty urinating.  Musculoskeletal: Negative for arthralgias.  Skin: Negative for rash and wound.  Neurological: Negative for seizures, syncope and headaches.  Hematological: Negative for adenopathy. Does not bruise/bleed easily.  Psychiatric/Behavioral: Negative for confusion.    Blood pressure 148/70, pulse 92, resp. rate 12, height 5\' 3"  (1.6 m), weight 162 lb 9.6 oz (73.755 kg).  Physical Exam Physical Exam  Constitutional: She is oriented to person, place, and time. She appears well-developed and well-nourished.  HENT:  Head: Normocephalic and atraumatic.  Eyes: EOM are normal. Pupils are equal, round, and reactive to light.  Neck: Normal range of motion. Neck supple.  Cardiovascular: Normal rate and regular rhythm.   Pulmonary/Chest: Effort normal and breath sounds normal. Right breast exhibits no inverted nipple, no mass, no nipple discharge, no skin change and no tenderness. Left breast exhibits no inverted nipple, no mass, no nipple discharge, no skin change and no tenderness. Breasts are asymmetrical.    Musculoskeletal: Normal range of motion.  Neurological: She is alert and oriented to person, place, and time.  Skin: Skin is warm and dry.  Psychiatric: She has a normal mood and affect. Her behavior is normal. Judgment and thought content normal.    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 She will need right breast localized partial mastectomy of lesion. Return to clinic after MRI since this will help with operative planning.       [name]. 09/19/2012, 2:29 PM

## 2012-09-20 ENCOUNTER — Telehealth (INDEPENDENT_AMBULATORY_CARE_PROVIDER_SITE_OTHER): Payer: Self-pay | Admitting: General Surgery

## 2012-09-20 NOTE — Telephone Encounter (Signed)
Left message with Olegario Messier at [facility] for this patient for MRI breast ask to call Marcelino Duster back with appt date and time

## 2012-09-26 ENCOUNTER — Telehealth: Payer: Self-pay | Admitting: Genetic Counselor

## 2012-09-26 NOTE — Telephone Encounter (Signed)
S/W pt in re Genetic Appt 03/27 @ 9 w/Karen Lowell Guitar.

## 2012-10-06 ENCOUNTER — Other Ambulatory Visit: Payer: 59

## 2012-10-13 ENCOUNTER — Ambulatory Visit
Admission: RE | Admit: 2012-10-13 | Discharge: 2012-10-13 | Disposition: A | Discharge status: Home / Self Care | Payer: 59 | Source: Ambulatory Visit | Attending: Surgery | Admitting: Surgery

## 2012-10-13 DIAGNOSIS — N6099 Unspecified benign mammary dysplasia of unspecified breast: Secondary | ICD-10-CM

## 2012-10-13 DIAGNOSIS — Z853 Personal history of malignant neoplasm of breast: Secondary | ICD-10-CM

## 2012-10-13 MED ORDER — GADOBENATE DIMEGLUMINE 529 MG/ML IV SOLN
14.0000 mL | Freq: Once | INTRAVENOUS | Status: AC | PRN
  Administered 2012-10-13: 14 mL via INTRAVENOUS

## 2012-10-18 ENCOUNTER — Telehealth (INDEPENDENT_AMBULATORY_CARE_PROVIDER_SITE_OTHER): Payer: Self-pay

## 2012-10-18 ENCOUNTER — Other Ambulatory Visit (INDEPENDENT_AMBULATORY_CARE_PROVIDER_SITE_OTHER): Payer: Self-pay

## 2012-10-18 ENCOUNTER — Telehealth: Payer: Self-pay | Admitting: Internal Medicine

## 2012-10-18 DIAGNOSIS — N6320 Unspecified lump in the left breast, unspecified quadrant: Secondary | ICD-10-CM

## 2012-10-18 NOTE — Telephone Encounter (Signed)
Patient Information:  Caller Name: Aairah  Phone: [phone]  Patient: Desiree Andrews, Desiree Andrews  Gender: Female  DOB: [birth year]  Age: 57 Years  PCP: Marga Melnick  Office Follow Up:  Does the office need to follow up with this patient?: No  Instructions For The Office: N/A  RN Note:  Patient states she developed sinus congestion and right ear congestion, onset X 4 weeks. Afebrile. Care advice given per guidelines. Patient advised Neti Pot, increased fluids, inhaled steam, humidifier, warm compresses to face. Call back parameters reviewed. Patient verbalizes understanding.  Symptoms  Reason For Call & Symptoms: Sinus and right ear congestion  Reviewed Health History In EMR: Yes  Reviewed Medications In EMR: Yes  Reviewed Allergies In EMR: Yes  Reviewed Surgeries / Procedures: Yes  Date of Onset of Symptoms: 09/20/2012  Treatments Tried: OTC Antihistamine  Treatments Tried Worked: No  Guideline(s) Used:  Sinus Pain and Congestion  Disposition Per Guideline:   See Today or Tomorrow in Office  Reason For Disposition Reached:   Sinus congestion (pressure, fullness) present > 10 days  Advice Given:  For a Stuffy Nose - Use Nasal Washes:  Introduction: Saline (salt water) nasal irrigation (nasal wash) is an effective and simple home remedy for treating stuffy nose and sinus congestion. The nose can be irrigated by pouring, spraying, or squirting salt water into the nose and then letting it run back out.  For a Stuffy Nose - Use Nasal Washes:  Introduction: Saline (salt water) nasal irrigation (nasal wash) is an effective and simple home remedy for treating stuffy nose and sinus congestion. The nose can be irrigated by pouring, spraying, or squirting salt water into the nose and then letting it run back out.  Methods: There are several ways to perform nasal irrigation. You can use a saline nasal spray bottle (available over-the-counter), a rubber ear syringe, a medical syringe without the  needle, or a Neti Pot.  Hydration:  Drink plenty of liquids (6-8 glasses of water daily). If the air in your home is dry, use a cool mist humidifier  Call Back If:   You become worse.  Appointment Scheduled:  10/19/2012 11:30:00 Appointment Scheduled Provider:  Marga Melnick

## 2012-10-18 NOTE — Telephone Encounter (Signed)
I called patient back regarding MRI results after talking to [facility]. I told her that the MRI showed an area on her left breast that radiologist recommends being biopsied. She stated she already had biopsy done and was told that it was negative. I told her the radiologist stated "the new area" was medial to where the clip is from the 1st biopsy site and they recommend biopsy since it looks suspicious. She is very upset because it seems she is having a lot of back and forth from place to place. I told her i understood and apologized. I told her I was not sure why this area was not seen during first MRI. I made her an appointment to see Dr. Luisa Hart on Friday. I also told her that Olegario Messier would be calling her to go over all this with her and schedule the [place] core biopsy if she wants or that she can wait until she meets with Dr. Luisa Hart.

## 2012-10-18 NOTE — Telephone Encounter (Signed)
Patient with pending appointment tomorrow, per Dr.Hopper's protocol if patient with pending appointment ok to close encounter

## 2012-10-19 ENCOUNTER — Encounter: Payer: Self-pay | Admitting: Internal Medicine

## 2012-10-19 ENCOUNTER — Ambulatory Visit
Admission: RE | Admit: 2012-10-19 | Discharge: 2012-10-19 | Disposition: A | Discharge status: Home / Self Care | Payer: 59 | Source: Ambulatory Visit | Attending: Surgery | Admitting: Surgery

## 2012-10-19 ENCOUNTER — Other Ambulatory Visit (INDEPENDENT_AMBULATORY_CARE_PROVIDER_SITE_OTHER): Payer: Self-pay | Admitting: Surgery

## 2012-10-19 ENCOUNTER — Ambulatory Visit (INDEPENDENT_AMBULATORY_CARE_PROVIDER_SITE_OTHER): Payer: 59 | Admitting: Internal Medicine

## 2012-10-19 VITALS — BP 130/78 | HR 108 | Temp 98.2°F | Wt 162.6 lb

## 2012-10-19 DIAGNOSIS — N6320 Unspecified lump in the left breast, unspecified quadrant: Secondary | ICD-10-CM

## 2012-10-19 DIAGNOSIS — H6991 Unspecified Eustachian tube disorder, right ear: Secondary | ICD-10-CM

## 2012-10-19 DIAGNOSIS — H6990 Unspecified Eustachian tube disorder, unspecified ear: Secondary | ICD-10-CM

## 2012-10-19 MED ORDER — METOPROLOL SUCCINATE ER 25 MG PO TB24: ORAL_TABLET | ORAL | Status: DC

## 2012-10-19 NOTE — Patient Instructions (Addendum)
Plain Mucinex (NOT D) for thick secretions ;force NON dairy fluids .   Nasal cleansing in the shower as discussed with lather of mild shampoo.After 10 seconds wash off lather while  exhaling through nostrils. Make sure that all residual soap is removed to prevent irritation.  Nasonex 1 spray in each nostril twice a day as needed. Use the "crossover" technique into opposite nostril spraying toward opposite ear @ 45 degree angle, not straight up into nostril.  Use a Neti pot daily only  as needed for significant sinus congestion; going from open side to congested side . Plain Allegra (NOT D )  160 daily , Loratidine 10 mg , OR Zyrtec 10 mg @ bedtime  as needed for itchy eyes & sneezing. Go to [facility] for eustachian tube dysfunction. Drink thin  fluids liberally through the day and chew sugarless gum . Do the Valsalva maneuver several times a day to "pop" ears open.

## 2012-10-19 NOTE — Progress Notes (Signed)
  Subjective:    Patient ID: Desiree Andrews, female    DOB: [birth year], 57 y.o.   MRN: [id number]  HPI The respiratory tract symptoms began 4 weeks ago as ear pressure, hoarsenes & [place] congestion [place] .Symptoms improved with OTC antihistamine but this week she has developed more  head congestion & ear pressure.    No exposures reported  to sick family, friends, work associates, allergens, or environmental factors as triggers .  Significant active  associated symptoms include sensitive teeth.    Flu shot current    There is no history of asthma ,  seasonal or perennial allergies.  The patient had quit smoking in 1992.                  Review of Systems Low grade fever, chills,&  sweats were not present   Extrinsic symptoms of itchy/ watery eyes &  sneezing were not present .  Symptoms not present include frontal headache,  facial pain, sore throat,  nasal purulence,   &  otic discharge. Myalgias and arthralgias were not present.     Objective:   Physical Exam General appearance:good health ;well nourished; no acute distress or increased work of breathing is present.   Eyes: No conjunctival inflammation or lid edema is present.  Ears:  External ear exam shows no significant lesions or deformities.  Otoscopic examination reveals clear canals, tympanic membranes are intact bilaterally without bulging, retraction, inflammation or discharge. Nose:  External nasal examination shows no deformity or inflammation. Nasal mucosa are pink and moist without lesions or exudates. No septal dislocation or deviation.No obstruction to airflow. Hyponasal speech Oral exam: Dental hygiene is good; lips and gums are healthy appearing.There is no oropharyngeal erythema or exudate noted.  Neck:  No deformities, masses, or tenderness noted.     Heart:  Normal rate and regular rhythm. S1 and S2 normal without gallop, click, rub or murmur.  Grade 1 murmur Lungs:Chest clear to  auscultation; no wheezes, rhonchi,rales ,or rubs present.No increased work of breathing.   Extremities:  No cyanosis, edema, or clubbing  noted  No  lymphadenopathy about the head, neck, or axilla noted.  Skin: Warm & dry        Assessment & Plan:  #1 eustachian tube dysfunction most likely related to the time in the mountains. No historical or physical evidence of rhinosinusitis  Plan: See orders and recommendations

## 2012-10-21 ENCOUNTER — Ambulatory Visit (INDEPENDENT_AMBULATORY_CARE_PROVIDER_SITE_OTHER): Payer: 59 | Admitting: Surgery

## 2012-10-21 ENCOUNTER — Encounter (INDEPENDENT_AMBULATORY_CARE_PROVIDER_SITE_OTHER): Payer: Self-pay | Admitting: Surgery

## 2012-10-21 VITALS — BP 128/76 | HR 74 | Temp 98.3°F | Resp 18 | Ht 63.0 in | Wt 163.1 lb

## 2012-10-21 DIAGNOSIS — N6099 Unspecified benign mammary dysplasia of unspecified breast: Secondary | ICD-10-CM

## 2012-10-21 DIAGNOSIS — N6089 Other benign mammary dysplasias of unspecified breast: Secondary | ICD-10-CM

## 2012-10-21 NOTE — Patient Instructions (Signed)
Return 1 month after genetic testing.  Will need right breast lumpectomy.

## 2012-10-21 NOTE — Progress Notes (Signed)
Subjective:     Patient ID: Desiree Andrews, female   DOB: 03/27/1956, 57 y.o.   MRN: [id number]  HPI patient returns in followup of her right breast atypical ductal hyperplasia. 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 The right breast appeared otherwise normal. She is scheduled for genetic testing in one month.   Review of Systems  Constitutional: Negative.   HENT: Negative.   Respiratory: Negative.        Objective:   Physical Exam  Constitutional: She appears well-nourished.  HENT:  Head: Normocephalic and atraumatic.  Pulmonary/Chest:    Skin: Skin is warm and dry.       Assessment:     Right breast ADH. History of left breast cancer    Plan:     Return to clinic 1 month.

## 2012-11-14 ENCOUNTER — Telehealth: Payer: Self-pay | Admitting: Medical Oncology

## 2012-11-14 NOTE — Telephone Encounter (Signed)
Pt LVMOM requesting to know whether the BRCA test is covered/preapproved by her insurance. VM given to Maylon Cos, Genetics Counsellor @ ext [phone].

## 2012-11-17 ENCOUNTER — Encounter: Payer: Self-pay | Admitting: Genetic Counselor

## 2012-11-17 ENCOUNTER — Other Ambulatory Visit: Payer: 59 | Admitting: Lab

## 2012-11-17 ENCOUNTER — Ambulatory Visit (INDEPENDENT_AMBULATORY_CARE_PROVIDER_SITE_OTHER): Payer: 59 | Admitting: Internal Medicine

## 2012-11-17 ENCOUNTER — Ambulatory Visit (HOSPITAL_BASED_OUTPATIENT_CLINIC_OR_DEPARTMENT_OTHER): Payer: 59 | Admitting: Genetic Counselor

## 2012-11-17 ENCOUNTER — Ambulatory Visit: Payer: 59 | Admitting: Internal Medicine

## 2012-11-17 VITALS — BP 148/96 | HR 108 | Wt 164.0 lb

## 2012-11-17 DIAGNOSIS — Z566 Other physical and mental strain related to work: Secondary | ICD-10-CM

## 2012-11-17 DIAGNOSIS — R6889 Other general symptoms and signs: Secondary | ICD-10-CM

## 2012-11-17 DIAGNOSIS — IMO0002 Reserved for concepts with insufficient information to code with codable children: Secondary | ICD-10-CM

## 2012-11-17 DIAGNOSIS — N6459 Other signs and symptoms in breast: Secondary | ICD-10-CM

## 2012-11-17 DIAGNOSIS — D0590 Unspecified type of carcinoma in situ of unspecified breast: Secondary | ICD-10-CM

## 2012-11-17 DIAGNOSIS — Z569 Unspecified problems related to employment: Secondary | ICD-10-CM

## 2012-11-17 DIAGNOSIS — N6099 Unspecified benign mammary dysplasia of unspecified breast: Secondary | ICD-10-CM

## 2012-11-17 DIAGNOSIS — Z853 Personal history of malignant neoplasm of breast: Secondary | ICD-10-CM

## 2012-11-17 MED ORDER — DULOXETINE HCL 30 MG PO CPEP: ORAL_CAPSULE | ORAL | Status: DC

## 2012-11-17 MED ORDER — CLONAZEPAM 0.5 MG PO TABS: ORAL_TABLET | ORAL | Status: DC

## 2012-11-17 NOTE — Progress Notes (Signed)
Dr.  Harriette Bouillon requested a consultation for genetic counseling and risk assessment for Desiree Andrews, a 57 y.o. female, for discussion of her personal history of breast cancer. She presents to clinic today to discuss the possibility of a genetic predisposition to cancer, and to further clarify her risks, as well as her family members' risks for cancer.   HISTORY OF PRESENT ILLNESS: In 2003, at the age of 59, Desiree Andrews was diagnosed with breast cancer. This was treated with lumpectomy, chemotherapy and radiation.    Past Medical History  Diagnosis Date  . Cardiopulmonary arrest 1991    Postpartum,? Pulmonary thromboembolism  . Cancer     breast  . Hyperlipidemia   . Hypertension     Past Surgical History  Procedure Laterality Date  . Cardiovascular stress test  2005    Neg  . Breast lumpectomy      Left, Dr.Rubin; Radiation & Chemotherapy X3  . Colonoscopy  2006    Normal  . Wrist surgery      Left, Dr.Rowen    History  Substance Use Topics  . Smoking status: Former Smoker    Quit date: 08/24/1990  . Smokeless tobacco: Never Used  . Alcohol Use: 8.4 oz/week    14 Glasses of wine per week     Comment: Socially    REPRODUCTIVE HISTORY AND PERSONAL RISK ASSESSMENT FACTORS: Menarche was at age unknown.   Menopause around age 58 Uterus Intact: Yes Ovaries Intact: Yes G4P2A2 , first live birth at age 9  She has not previously undergone treatment for infertility.   OCP use for 5-6 years   She has not used HRT in the past.    FAMILY HISTORY:  We obtained a detailed, 4-generation family history.  Significant diagnoses are listed below: Family History  Problem Relation Age of Onset  . Diabetes Mother   . Hypertension Mother   . Hyperlipidemia Mother   . Stroke Mother 17    02/2009  . Heart attack Maternal Grandmother     in her 91s  . Stroke Maternal Grandmother   . Stomach cancer Maternal Grandfather 67  The patient was diagnosed with breast cancer  at age 32.  More recently she had a breast biopsy that showed atypical hyperplasia.  She has a maternal half sister who is healthy and cancer free.  Her father died at 70 from a brain aneurysm and was reported to be a heavy drinker.  He was an only child and his parents died of old age.  Her mother is alive at age 33.  She was an only child.  The patient's maternal grandfather died of stomach cancer in his 44s.  Her grandmother died of heart problems in her 61s.  There is no other reported cancer history.  Patient's maternal ancestors are of unknown descent, and paternal ancestors are of unknown descent. There is no reported Ashkenazi Jewish ancestry. There is no  known consanguinity.  GENETIC COUNSELING RISK ASSESSMENT, DISCUSSION, AND SUGGESTED FOLLOW UP: We reviewed the natural history and genetic etiology of sporadic, familial and hereditary cancer syndromes.  About 5-10% of breast cancer is hereditary.  Of this, about 85% is the result of a BRCA1 or BRCA2 mutation.  We reviewed the red flags of hereditary cancer syndromes and the dominant inheritance patterns.  If the BRCA testing is negative, we discussed that we could be testing for the wrong gene.  We discussed gene panels, and that several cancer genes that are associated  with different cancers can be tested at the same time.  Because of her limited family history, we will order a panel test.   The patient's personal history of breast cancer is suggestive of the following possible diagnosis: hereditary cancer syndrome  We discussed that identification of a hereditary cancer syndrome may help her care providers tailor the patients medical management. If a mutation indicating hereditary cancer syndrome is detected in this case, the [employer] recommendations would include increased cancer surveillance and possible prophylactic surgery. If a mutation is detected, the patient will be referred back to the referring provider  and to any additional appropriate care providers to discuss the relevant options.   If a mutation is not found in the patient, this will decrease the likelihood of a hereditary cancer syndrome as the explanation for her breast cancer. Cancer surveillance options would be discussed for the patient according to the appropriate standard National Comprehensive Cancer Network and American Cancer Society guidelines, with consideration of their personal and family history risk factors. In this case, the patient will be referred back to their care providers for discussions of management.   After considering the risks, benefits, and limitations, the patient provided informed consent for  the following  testing: [facility] through [facility].   Per the patient's request, we will contact her by telephone to discuss these results. A follow up genetic counseling visit will be scheduled if indicated.  The patient was seen for a total of 60 minutes, greater than 50% of which was spent face-to-face counseling.  This plan is being carried out per Dr. Maisie Fus Cornett's recommendations.  This note will also be sent to the referring provider via the electronic medical record. The patient will be supplied with a summary of this genetic counseling discussion as well as educational information on the discussed hereditary cancer syndromes following the conclusion of their visit.   Patient was discussed with Dr. Drue Second.   _______________________________________________________________________ For Office Staff:  Number of people involved in session: 1 Was an Intern/ student involved with case: no }

## 2012-11-17 NOTE — Patient Instructions (Addendum)
Please reconsider taking the agent to raise the neurotransmitters which are essential for good brain function, both intellectual & emotional health. These agents are not addictive and simply keep this essential neurotransmitter at therapeutic levels. If these levels become severely depleted; depression or panic attacks can occur.   To prevent sleep dysfunction follow these instructions for sleep hygiene. Do not read, watch TV, or eat in bed. Do not get into bed until you are ready to turn off the light &  to go to sleep. Do not ingest stimulants ( decongestants, diet pills, nicotine, caffeine) after the evening meal.

## 2012-11-17 NOTE — Progress Notes (Signed)
  Subjective:    Patient ID: Desiree Andrews, female    DOB: Nov 18, 1955, 57 y.o.   MRN: [id number]  HPI She is under great deal of stress related to health issues and work.  She has had 3 biopsies and multiple images to assess breast changes. Final path reports& results of special BRAC testing are pending. These would determine whether a lumpectomy or bilateral mastectomies are pursued  She does also in a demanding work situation which requires hyperfocus. She's having difficulty meeting the quality issue demands.  Additionally there is a personal issue of bankruptcy.  She is  considering taking short-term disability from work.    Review of Systems  Surprisingly distress is not impacted her appetite but she is having marked sleep dysfunction     Objective:   Physical Exam  She is appropriate; she is in no distress. She exhibits reality testing concerning these major health, work, financial stress orders. She is intermittently tearful as she describes the health issues. She is nonsuicidal.        Assessment & Plan:  #1 profound exogenous stress with high risk for depression  Plan:The pathophysiology of neurotransmitter deficiency was discussed along with the benefits and potential adverse effects of SSRI therapy. Cymbalta titration was recommended along with clonazepam 0.251-2 at bedtime as needed.  Nonmedical options such as a CVE program were discussed  I encouraged her to contact Dr. Berniece Andreas, Psychologist & her Human Relations Department.FMLA could be considered

## 2012-11-18 ENCOUNTER — Telehealth (INDEPENDENT_AMBULATORY_CARE_PROVIDER_SITE_OTHER): Payer: Self-pay | Admitting: *Deleted

## 2012-11-18 NOTE — Telephone Encounter (Signed)
Patient reports that she had the genetic testing draw yesterday and was told it could be up to 4 weeks to result.  Patient states she has an appt on 12/02/12 and the appt was to discuss these results per patient.  Patient is also wanting to see about approval for taking some time off of work due to stress from "all this".  Patient states she isn't asking for all the time off until surgery but states this has been really hard on her.

## 2012-11-21 NOTE — Telephone Encounter (Signed)
Called and left a message for patient that she will need to discuss anxiety and time off with her PMD or oncologist.  Also changed patients appt from 12/02/12 to 12/19/12 at 910a which is 4 weeks following when her genetic testing was sent off so hopefully will have results at that time.  Left message for patient to call back if this appt will not work for her or if she has any additional questions.

## 2012-11-21 NOTE — Telephone Encounter (Signed)
Cannot approve any time off.  She will need to talk with oncology about that issue or primary care evaluation for her anxiety.  I have no control over genetics  Can move appointment  But not much else to do until results come back.  I do not treat anxiety and if this is an issue,  She needs to see appropriate MD that is qualified to evaluate and treat this condition.

## 2012-11-22 ENCOUNTER — Telehealth (INDEPENDENT_AMBULATORY_CARE_PROVIDER_SITE_OTHER): Payer: Self-pay

## 2012-11-22 ENCOUNTER — Other Ambulatory Visit (INDEPENDENT_AMBULATORY_CARE_PROVIDER_SITE_OTHER): Payer: Self-pay | Admitting: Surgery

## 2012-11-22 ENCOUNTER — Telehealth: Payer: Self-pay | Admitting: Internal Medicine

## 2012-11-22 ENCOUNTER — Other Ambulatory Visit: Payer: Self-pay | Admitting: Internal Medicine

## 2012-11-22 DIAGNOSIS — N6099 Unspecified benign mammary dysplasia of unspecified breast: Secondary | ICD-10-CM

## 2012-11-22 MED ORDER — LORAZEPAM 0.5 MG PO TABS: ORAL_TABLET | ORAL | Status: DC

## 2012-11-22 NOTE — Telephone Encounter (Signed)
Hopp please advise on patient's request for Lorazepam, Clonazepam was rx'ed on 11/17/2012 #30

## 2012-11-22 NOTE — Telephone Encounter (Signed)
The pt called and states she was going to wait to schedule surgery after her genetic results are back.  She is going to lose her insurance as of 12/27/12.  She needs to go ahead and get it taken care of.  Please call.

## 2012-11-22 NOTE — Progress Notes (Signed)
Pt would like to proceed with right breat NL lumpectomy at this point since her insurance coverage will expire in may.  Will need to wait on genetics.  Will schedule with office. The procedure has been discussed with the patient. Alternatives to surgery have been discussed with the patient.  Risks of surgery include bleeding,  Infection,  Seroma formation, death,  and the need for further surgery.   The patient understands and wishes to proceed. Discussed with patient today.

## 2012-11-22 NOTE — Telephone Encounter (Signed)
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  Also asking how to know if appointment with psychologist, Ms Marlana Salvage for 12/07/12 is covered with [employer].  Insurance will terminate 12/27/12.  Suggested she call [employer] service to verify if Ms Marlana Salvage is a provider.  Agreed to call [employer] 11/23/12 to verify psychologist.  Also, [facility] is faxing refill request for Lorazepam 11/22/12.  She ran out of it today, 11/22/12.

## 2012-11-22 NOTE — Telephone Encounter (Signed)
RX called in .

## 2012-11-22 NOTE — Telephone Encounter (Signed)
Lorazepam 0.5 mg q 8-12 hrs prn only #30. Do not take with Clonazepam

## 2012-11-23 ENCOUNTER — Encounter (HOSPITAL_BASED_OUTPATIENT_CLINIC_OR_DEPARTMENT_OTHER): Payer: Self-pay | Admitting: *Deleted

## 2012-11-23 NOTE — Progress Notes (Signed)
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

## 2012-11-24 ENCOUNTER — Other Ambulatory Visit: Payer: Self-pay

## 2012-11-24 ENCOUNTER — Encounter (HOSPITAL_BASED_OUTPATIENT_CLINIC_OR_DEPARTMENT_OTHER)
Admission: RE | Admit: 2012-11-24 | Discharge: 2012-11-24 | Disposition: A | Discharge status: Home / Self Care | Payer: 59 | Source: Ambulatory Visit | Attending: Surgery | Admitting: Surgery

## 2012-11-24 LAB — CBC WITH DIFFERENTIAL/PLATELET
Eosinophils Relative: 5 % (ref 0–5)
HCT: 41.8 % (ref 36.0–46.0)
Hemoglobin: 14.9 g/dL (ref 12.0–15.0)
Lymphocytes Relative: 44 % (ref 12–46)
Lymphs Abs: 3 10*3/uL (ref 0.7–4.0)
MCV: 90.5 fL (ref 78.0–100.0)
Monocytes Absolute: 0.7 10*3/uL (ref 0.1–1.0)
RBC: 4.62 MIL/uL (ref 3.87–5.11)
WBC: 6.9 10*3/uL (ref 4.0–10.5)

## 2012-11-24 LAB — COMPREHENSIVE METABOLIC PANEL
ALT: 32 U/L (ref 0–35)
CO2: 26 mEq/L (ref 19–32)
Calcium: 9.6 mg/dL (ref 8.4–10.5)
Chloride: 99 mEq/L (ref 96–112)
Creatinine, Ser: 0.7 mg/dL (ref 0.50–1.10)
GFR calc Af Amer: >90 mL/min (ref >90)
GFR calc non Af Amer: >90 mL/min (ref >90)
Glucose, Bld: 107 mg/dL — ABNORMAL HIGH (ref 70–99)
Total Bilirubin: 0.4 mg/dL (ref 0.3–1.2)

## 2012-11-25 ENCOUNTER — Encounter: Payer: 59 | Admitting: Internal Medicine

## 2012-11-30 ENCOUNTER — Ambulatory Visit
Admission: RE | Admit: 2012-11-30 | Discharge: 2012-11-30 | Disposition: A | Discharge status: Home / Self Care | Payer: 59 | Source: Ambulatory Visit | Attending: Surgery | Admitting: Surgery

## 2012-11-30 ENCOUNTER — Ambulatory Visit (HOSPITAL_BASED_OUTPATIENT_CLINIC_OR_DEPARTMENT_OTHER)
Admission: RE | Admit: 2012-11-30 | Discharge: 2012-11-30 | Disposition: A | Discharge status: Home / Self Care | Payer: 59 | Source: Ambulatory Visit | Attending: Surgery | Admitting: Surgery

## 2012-11-30 ENCOUNTER — Encounter (HOSPITAL_BASED_OUTPATIENT_CLINIC_OR_DEPARTMENT_OTHER)
Admission: RE | Disposition: A | Discharge status: Home / Self Care | Payer: Self-pay | Source: Ambulatory Visit | Attending: Surgery

## 2012-11-30 ENCOUNTER — Encounter (HOSPITAL_BASED_OUTPATIENT_CLINIC_OR_DEPARTMENT_OTHER): Payer: Self-pay | Admitting: Certified Registered Nurse Anesthetist

## 2012-11-30 ENCOUNTER — Ambulatory Visit (HOSPITAL_BASED_OUTPATIENT_CLINIC_OR_DEPARTMENT_OTHER): Payer: 59 | Admitting: Certified Registered Nurse Anesthetist

## 2012-11-30 ENCOUNTER — Ambulatory Visit: Payer: 59 | Admitting: Licensed Clinical Social Worker

## 2012-11-30 DIAGNOSIS — I10 Essential (primary) hypertension: Secondary | ICD-10-CM | POA: Insufficient documentation

## 2012-11-30 DIAGNOSIS — F411 Generalized anxiety disorder: Secondary | ICD-10-CM | POA: Insufficient documentation

## 2012-11-30 DIAGNOSIS — B1920 Unspecified viral hepatitis C without hepatic coma: Secondary | ICD-10-CM | POA: Insufficient documentation

## 2012-11-30 DIAGNOSIS — N6099 Unspecified benign mammary dysplasia of unspecified breast: Secondary | ICD-10-CM

## 2012-11-30 DIAGNOSIS — Z923 Personal history of irradiation: Secondary | ICD-10-CM | POA: Insufficient documentation

## 2012-11-30 DIAGNOSIS — F329 Major depressive disorder, single episode, unspecified: Secondary | ICD-10-CM | POA: Insufficient documentation

## 2012-11-30 DIAGNOSIS — F3289 Other specified depressive episodes: Secondary | ICD-10-CM | POA: Insufficient documentation

## 2012-11-30 DIAGNOSIS — N6029 Fibroadenosis of unspecified breast: Secondary | ICD-10-CM | POA: Insufficient documentation

## 2012-11-30 DIAGNOSIS — Z9221 Personal history of antineoplastic chemotherapy: Secondary | ICD-10-CM | POA: Insufficient documentation

## 2012-11-30 DIAGNOSIS — N6089 Other benign mammary dysplasias of unspecified breast: Secondary | ICD-10-CM

## 2012-11-30 DIAGNOSIS — Z87891 Personal history of nicotine dependence: Secondary | ICD-10-CM | POA: Insufficient documentation

## 2012-11-30 DIAGNOSIS — K219 Gastro-esophageal reflux disease without esophagitis: Secondary | ICD-10-CM | POA: Insufficient documentation

## 2012-11-30 DIAGNOSIS — Z853 Personal history of malignant neoplasm of breast: Secondary | ICD-10-CM | POA: Insufficient documentation

## 2012-11-30 HISTORY — DX: Major depressive disorder, single episode, unspecified: F32.9

## 2012-11-30 HISTORY — DX: Anxiety disorder, unspecified: F41.9

## 2012-11-30 HISTORY — PX: BREAST LUMPECTOMY WITH NEEDLE LOCALIZATION: SHX5759

## 2012-11-30 HISTORY — DX: Depression, unspecified: F32.A

## 2012-11-30 SURGERY — BREAST LUMPECTOMY WITH NEEDLE LOCALIZATION
Anesthesia: General | Site: Breast | Laterality: Right | Wound class: Clean

## 2012-11-30 MED ORDER — DEXAMETHASONE SODIUM PHOSPHATE 4 MG/ML IJ SOLN
INTRAMUSCULAR | Status: DC | PRN
  Administered 2012-11-30: 10 mg via INTRAVENOUS

## 2012-11-30 MED ORDER — ACETAMINOPHEN 10 MG/ML IV SOLN
INTRAVENOUS | Status: DC | PRN
  Administered 2012-11-30: 1000 mg via INTRAVENOUS

## 2012-11-30 MED ORDER — FENTANYL CITRATE 0.05 MG/ML IJ SOLN: 50.0000 ug | INTRAMUSCULAR | Status: DC | PRN

## 2012-11-30 MED ORDER — OXYCODONE HCL 5 MG/5ML PO SOLN: 5.0000 mg | Freq: Once | ORAL | Status: AC | PRN

## 2012-11-30 MED ORDER — MEPERIDINE HCL 25 MG/ML IJ SOLN: 6.2500 mg | INTRAMUSCULAR | Status: DC | PRN

## 2012-11-30 MED ORDER — CHLORHEXIDINE GLUCONATE 4 % EX LIQD: 1.0000 "application " | Freq: Once | CUTANEOUS | Status: DC

## 2012-11-30 MED ORDER — ONDANSETRON HCL 4 MG/2ML IJ SOLN
INTRAMUSCULAR | Status: DC | PRN
  Administered 2012-11-30: 4 mg via INTRAVENOUS

## 2012-11-30 MED ORDER — LACTATED RINGERS IV SOLN
INTRAVENOUS | Status: DC
  Administered 2012-11-30 (×2): via INTRAVENOUS

## 2012-11-30 MED ORDER — ACETAMINOPHEN 10 MG/ML IV SOLN: 1000.0000 mg | Freq: Once | INTRAVENOUS | Status: DC

## 2012-11-30 MED ORDER — MIDAZOLAM HCL 2 MG/2ML IJ SOLN: 1.0000 mg | INTRAMUSCULAR | Status: DC | PRN

## 2012-11-30 MED ORDER — LIDOCAINE HCL (CARDIAC) 20 MG/ML IV SOLN
INTRAVENOUS | Status: DC | PRN
  Administered 2012-11-30: 6 mg via INTRAVENOUS

## 2012-11-30 MED ORDER — DEXTROSE 5 % IV SOLN
3.0000 g | INTRAVENOUS | Status: AC
  Administered 2012-11-30: 2 g via INTRAVENOUS

## 2012-11-30 MED ORDER — MIDAZOLAM HCL 2 MG/2ML IJ SOLN: 0.5000 mg | Freq: Once | INTRAMUSCULAR | Status: DC | PRN

## 2012-11-30 MED ORDER — FENTANYL CITRATE 0.05 MG/ML IJ SOLN
INTRAMUSCULAR | Status: DC | PRN
  Administered 2012-11-30 (×3): 50 ug via INTRAVENOUS

## 2012-11-30 MED ORDER — EPHEDRINE SULFATE 50 MG/ML IJ SOLN
INTRAMUSCULAR | Status: DC | PRN
  Administered 2012-11-30: 10 mg via INTRAVENOUS

## 2012-11-30 MED ORDER — HYDROCODONE-ACETAMINOPHEN 5-325 MG PO TABS: 1.0000 | ORAL_TABLET | Freq: Four times a day (QID) | ORAL | Status: DC | PRN

## 2012-11-30 MED ORDER — OXYCODONE HCL 5 MG PO TABS
5.0000 mg | ORAL_TABLET | Freq: Once | ORAL | Status: AC | PRN
  Administered 2012-11-30: 5 mg via ORAL

## 2012-11-30 MED ORDER — MIDAZOLAM HCL 5 MG/5ML IJ SOLN
INTRAMUSCULAR | Status: DC | PRN
  Administered 2012-11-30: 1 mg via INTRAVENOUS

## 2012-11-30 MED ORDER — PROMETHAZINE HCL 25 MG/ML IJ SOLN: 6.2500 mg | INTRAMUSCULAR | Status: DC | PRN

## 2012-11-30 MED ORDER — PROPOFOL 10 MG/ML IV BOLUS
INTRAVENOUS | Status: DC | PRN
  Administered 2012-11-30: 150 mg via INTRAVENOUS

## 2012-11-30 MED ORDER — BUPIVACAINE-EPINEPHRINE 0.25% -1:200000 IJ SOLN
INTRAMUSCULAR | Status: DC | PRN
  Administered 2012-11-30: 10 mL

## 2012-11-30 MED ORDER — FENTANYL CITRATE 0.05 MG/ML IJ SOLN: 25.0000 ug | INTRAMUSCULAR | Status: DC | PRN

## 2012-11-30 SURGICAL SUPPLY — 47 items
ADH SKN CLS APL DERMABOND .7 (GAUZE/BANDAGES/DRESSINGS) ×1
BINDER BREAST LRG (GAUZE/BANDAGES/DRESSINGS) IMPLANT
BINDER BREAST MEDIUM (GAUZE/BANDAGES/DRESSINGS) IMPLANT
BINDER BREAST XLRG (GAUZE/BANDAGES/DRESSINGS) IMPLANT
BINDER BREAST XXLRG (GAUZE/BANDAGES/DRESSINGS) IMPLANT
BLADE SURG 15 STRL LF DISP TIS (BLADE) ×1 IMPLANT
BLADE SURG 15 STRL SS (BLADE) ×2
CANISTER SUCTION 1200CC (MISCELLANEOUS) ×2 IMPLANT
CHLORAPREP W/TINT 26ML (MISCELLANEOUS) ×2 IMPLANT
CLIP TI WIDE RED SMALL 6 (CLIP) IMPLANT
CLOTH BEACON ORANGE TIMEOUT ST (SAFETY) ×2 IMPLANT
COVER MAYO STAND STRL (DRAPES) ×2 IMPLANT
COVER TABLE BACK 60X90 (DRAPES) ×2 IMPLANT
DECANTER SPIKE VIAL GLASS SM (MISCELLANEOUS) ×1 IMPLANT
DERMABOND ADVANCED (GAUZE/BANDAGES/DRESSINGS) ×1
DERMABOND ADVANCED .7 DNX12 (GAUZE/BANDAGES/DRESSINGS) ×1 IMPLANT
DEVICE DUBIN W/COMP PLATE 8390 (MISCELLANEOUS) ×1 IMPLANT
DRAPE LAPAROSCOPIC ABDOMINAL (DRAPES) IMPLANT
DRAPE PED LAPAROTOMY (DRAPES) ×2 IMPLANT
DRAPE UTILITY XL STRL (DRAPES) ×2 IMPLANT
ELECT COATED BLADE 2.86 ST (ELECTRODE) ×2 IMPLANT
ELECT REM PT RETURN 9FT ADLT (ELECTROSURGICAL) ×2
ELECTRODE REM PT RTRN 9FT ADLT (ELECTROSURGICAL) ×1 IMPLANT
GLOVE BIO SURGEON STRL SZ7 (GLOVE) ×1 IMPLANT
GLOVE BIOGEL PI IND STRL 8 (GLOVE) ×1 IMPLANT
GLOVE BIOGEL PI INDICATOR 8 (GLOVE) ×1
GLOVE ECLIPSE 8.0 STRL XLNG CF (GLOVE) ×2 IMPLANT
GLOVE INDICATOR 7.0 STRL GRN (GLOVE) ×1 IMPLANT
GOWN PREVENTION PLUS XLARGE (GOWN DISPOSABLE) ×3 IMPLANT
KIT MARKER MARGIN INK (KITS) IMPLANT
NDL HYPO 25X1 1.5 SAFETY (NEEDLE) ×1 IMPLANT
NEEDLE HYPO 25X1 1.5 SAFETY (NEEDLE) ×2 IMPLANT
NS IRRIG 1000ML POUR BTL (IV SOLUTION) ×2 IMPLANT
PACK BASIN DAY SURGERY FS (CUSTOM PROCEDURE TRAY) ×2 IMPLANT
PENCIL BUTTON HOLSTER BLD 10FT (ELECTRODE) ×2 IMPLANT
SHEET MEDIUM DRAPE 40X70 STRL (DRAPES) ×1 IMPLANT
SLEEVE SCD COMPRESS KNEE MED (MISCELLANEOUS) ×2 IMPLANT
SPONGE LAP 4X18 X RAY DECT (DISPOSABLE) ×2 IMPLANT
SUT MON AB 4-0 PC3 18 (SUTURE) ×2 IMPLANT
SUT SILK 2 0 SH (SUTURE) IMPLANT
SUT VIC AB 3-0 SH 27 (SUTURE) ×4
SUT VIC AB 3-0 SH 27X BRD (SUTURE) ×1 IMPLANT
SYR CONTROL 10ML LL (SYRINGE) ×2 IMPLANT
TOWEL OR 17X24 6PK STRL BLUE (TOWEL DISPOSABLE) ×3 IMPLANT
TOWEL OR NON WOVEN STRL DISP B (DISPOSABLE) ×2 IMPLANT
TUBE CONNECTING 20X1/4 (TUBING) ×2 IMPLANT
YANKAUER SUCT BULB TIP NO VENT (SUCTIONS) ×2 IMPLANT

## 2012-11-30 NOTE — Op Note (Signed)
Preoperative diagnosis: Right breast atypical ductal hyperplasia  Postop diagnosis: Same  Procedure: right breast needle localized lumpectomy with wire localization  Surgeon: Harriette Bouillon M.D.  Anesthesia: LMA with 0.25% Sensorcaine local  EBL: Less than 40 cc  Specimen:  Right Breast mass with wire and clip verified by radiography to pathology.  Additional anterior margin taken.  Gross margins clear.  Drains: None  Indications for procedure: The patient presents with a  Right breast mass. Core biopsy showed it to be consistent with atypical ductal hyperplasia . Options of observation  versus breast conservation were discussed. The patient was to proceed with right breast lumpectomy with wire localization.  Description of procedure: The patient was seen in the holding area and the appropriate side was marked. Questions are answered. Wire localization was done the radiology. The patient was taken back to the operating room and placed supine on the operating room table. After induction of general anesthesia, chest and upper arm  were prepped and draped in a sterile fashion. Timeout was done and she received preoperative antibiotics. Curvilinear incision was made around the wire insertion site in the central breast. . All tissue around the wire was excised and hemostasis was achieved with cautery. The area was removed in its entirety upon gross examination. Gross margin negative. Radiograph revealed the mass, wire and clip to be in the specimen. The wound was closed in layers using 3-0 Vicryl and 4-0 Monocryl subcuticular stitch. Dermabond applied. All final counts found to be correct. Patient awoke extubated taken recovery in satisfactory condition.

## 2012-11-30 NOTE — Interval H&P Note (Signed)
History and Physical Interval Note:  11/30/2012 9:37 AM  Desiree Andrews  has presented today for surgery, with the diagnosis of ADH  The various methods of treatment have been discussed with the patient and family. After consideration of risks, benefits and other options for treatment, the patient has consented to  Procedure(s) with comments: BREAST LUMPECTOMY WITH NEEDLE LOCALIZATION (Right) - needle localozation at [facility] 8:00  as a surgical intervention .  The patient's history has been reviewed, patient examined, no change in status, stable for surgery.  I have reviewed the patient's chart and labs.  Questions were answered to the patient's satisfaction.     Robey Massmann A.   

## 2012-11-30 NOTE — Interval H&P Note (Signed)
History and Physical Interval Note:  11/30/2012 9:37 AM  Desiree Andrews  has presented today for surgery, with the diagnosis of ADH  The various methods of treatment have been discussed with the patient and family. After consideration of risks, benefits and other options for treatment, the patient has consented to  Procedure(s) with comments: BREAST LUMPECTOMY WITH NEEDLE LOCALIZATION (Right) - needle localozation at [facility] 8:00  as a surgical intervention .  The patient's history has been reviewed, patient examined, no change in status, stable for surgery.  I have reviewed the patient's chart and labs.  Questions were answered to the patient's satisfaction.     [name]

## 2012-11-30 NOTE — H&P (View-Only) (Signed)
Pt would like to proceed with right breat NL lumpectomy at this point since her insurance coverage will expire in may.  Will need to wait on genetics.  Will schedule with office. The procedure has been discussed with the patient. Alternatives to surgery have been discussed with the patient.  Risks of surgery include bleeding,  Infection,  Seroma formation, death,  and the need for further surgery.   The patient understands and wishes to proceed. Discussed with patient today. 

## 2012-11-30 NOTE — Anesthesia Preprocedure Evaluation (Addendum)
Anesthesia Evaluation  Patient identified by MRN, date of birth, ID band Patient awake    Reviewed: Allergy & Precautions, H&P , NPO status , Patient's Chart, lab work & pertinent test results, reviewed documented beta blocker date and time   History of Anesthesia Complications Negative for: history of anesthetic complications  Airway Mallampati: II TM Distance: >3 FB Neck ROM: Full    Dental  (+) Teeth Intact and Dental Advisory Given   Pulmonary neg pulmonary ROS, former smoker,  breath sounds clear to auscultation  Pulmonary exam normal       Cardiovascular hypertension, Pt. on home beta blockers - dysrhythmias Rhythm:Regular Rate:Normal  '03 MUGA: normal perfusion, EF 60-65%   Neuro/Psych  Headaches, PSYCHIATRIC DISORDERS Anxiety Depression    GI/Hepatic GERD-  Controlled,(+) Hepatitis -, C  Endo/Other  negative endocrine ROS  Renal/GU negative Renal ROS     Musculoskeletal   Abdominal   Peds  Hematology   Anesthesia Other Findings   Reproductive/Obstetrics                         Anesthesia Physical Anesthesia Plan  ASA: II  Anesthesia Plan: General   Post-op Pain Management:    Induction: Intravenous  Airway Management Planned: LMA  Additional Equipment:   Intra-op Plan:   Post-operative Plan:   Informed Consent: I have reviewed the patients History and Physical, chart, labs and discussed the procedure including the risks, benefits and alternatives for the proposed anesthesia with the patient or authorized representative who has indicated his/her understanding and acceptance.   Dental advisory given  Plan Discussed with: CRNA and Surgeon  Anesthesia Plan Comments: (Plan routine monitors, GA- LMA ok)        Anesthesia Quick Evaluation

## 2012-11-30 NOTE — Anesthesia Postprocedure Evaluation (Signed)
  Anesthesia Post-op Note  Patient: Desiree Andrews  Procedure(s) Performed: Procedure(s) with comments: BREAST LUMPECTOMY WITH NEEDLE LOCALIZATION (Right) - needle localization at [facility] 8:00   Patient Location: PACU  Anesthesia Type:General  Level of Consciousness: awake, alert , oriented and patient cooperative  Airway and Oxygen Therapy: Patient Spontanous Breathing  Post-op Pain: none  Post-op Assessment: Post-op Vital signs reviewed, Patient's Cardiovascular Status Stable, Respiratory Function Stable, Patent Airway and Pain level controlled, nausea improved  Post-op Vital Signs: Reviewed and stable  Complications: No apparent anesthesia complications

## 2012-11-30 NOTE — H&P (Signed)
Desiree Andrews Description: 57 year old female  10/21/2012 9:40 AM Office Visit Provider: Clovis Pu. Glorya Bartley, MD  MRN: [id number] Department: [facility]            Diagnoses Reason for Visit   Atypical ductal hyperplasia of breast - Primary  Results     MRI   610.8  Breast Cancer           Current Vitals - Last Recorded    BP Pulse Temp(Src) Resp Ht Wt   128/76 74 98.3 F (36.8 C) (Temporal) 18 5\' 3"  (1.6 m) 163 lb 2 oz (73.993 kg)       BMI             28.9 kg/m2            Vitals History Recorded            Progress Notes    Desiree Andrews A. Dorine Duffey, MD at 10/21/2012 10:25 AM    Status: Signed             Subjective:     Patient ID: Desiree Andrews, female DOB: 14-Nov-1955, 57 y.o. MRN: [id number]  HPI patient returns in followup of her right breast atypical ductal hyperplasia. 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 The right breast appeared otherwise normal. She is scheduled for genetic testing in one month.  Review of Systems  Constitutional: Negative.  HENT: Negative.  Respiratory: Negative.     Objective:     Physical Exam  Constitutional: She appears well-nourished.  HENT:  Head: Normocephalic and atraumatic.  Pulmonary/Chest:    Skin: Skin is warm and dry.     Assessment:     Right breast ADH.  History of left breast cancer     Plan:     Return to clinic 1 month.               Not recorded                            Patient Instructions    Return 1 month after genetic testing. Will need right breast lumpectomy.          Level of Service Follow-up and Disposition   PR OFFICE OUTPATIENT VISIT 10 MINUTES [99212]  Return in about 4 weeks (around 11/18/2012).          Routing History Recorded             All Flowsheet Templates (all recorded)    Encounter Vitals Flowsheet   Custom Formula Data Flowsheet   Anthropometrics Flowsheet                        Referring Provider    Pecola Lawless, MD            All Charges for This Encounter    Code Description Service Date Service Provider Modifiers Quantity   [phone] PR OFFICE OUTPATIENT VISIT 10 MINUTES 10/21/2012 Maisie Fus A. Kenn Rekowski, MD  1               Other Encounter Related Information    Allergies & Medications      Problem List      History      Patient-Entered Questionnaires      AVS Reports    Date/Time Report Action User   10/21/2012 10:19 AM  After Visit Summary Printed Maisie Fus A. Baylin Cabal, MD          No data filed

## 2012-11-30 NOTE — Anesthesia Procedure Notes (Signed)
Procedure Name: LMA Insertion Date/Time: 11/30/2012 9:53 AM Performed by: Audreyana Huntsberry D Pre-anesthesia Checklist: Patient identified, Emergency Drugs available, Suction available and Patient being monitored Patient Re-evaluated:Patient Re-evaluated prior to inductionOxygen Delivery Method: Circle System Utilized Preoxygenation: Pre-oxygenation with 100% oxygen Intubation Type: IV induction Ventilation: Mask ventilation without difficulty LMA: LMA inserted LMA Size: 4.0 Number of attempts: 1 Airway Equipment and Method: bite block Placement Confirmation: positive ETCO2 Tube secured with: Tape Dental Injury: Teeth and Oropharynx as per pre-operative assessment

## 2012-11-30 NOTE — Transfer of Care (Signed)
Immediate Anesthesia Transfer of Care Note  Patient: Desiree Andrews  Procedure(s) Performed: Procedure(s) with comments: BREAST LUMPECTOMY WITH NEEDLE LOCALIZATION (Right) - needle localization at [facility] 8:00   Patient Location: PACU  Anesthesia Type:General  Level of Consciousness: awake, alert , oriented and patient cooperative  Airway & Oxygen Therapy: Patient Spontanous Breathing and Patient connected to face mask oxygen  Post-op Assessment: Report given to PACU RN and Post -op Vital signs reviewed and stable  Post vital signs: Reviewed and stable  Complications: No apparent anesthesia complications

## 2012-11-30 NOTE — Interval H&P Note (Signed)
History and Physical Interval Note:  11/30/2012 8:12 AM  Desiree Andrews  has presented today for surgery, with the diagnosis of ADH  The various methods of treatment have been discussed with the patient and family. After consideration of risks, benefits and other options for treatment, the patient has consented to  Procedure(s) with comments: BREAST LUMPECTOMY WITH NEEDLE LOCALIZATION (Right) - needle localozation at [facility] 8:00  as a surgical intervention .  The patient's history has been reviewed, patient examined, no change in status, stable for surgery.  I have reviewed the patient's chart and labs.  Questions were answered to the patient's satisfaction.     Spiro Ausborn A.

## 2012-12-01 ENCOUNTER — Encounter (HOSPITAL_BASED_OUTPATIENT_CLINIC_OR_DEPARTMENT_OTHER): Payer: Self-pay | Admitting: Surgery

## 2012-12-02 ENCOUNTER — Encounter (INDEPENDENT_AMBULATORY_CARE_PROVIDER_SITE_OTHER): Payer: 59 | Admitting: Surgery

## 2012-12-02 ENCOUNTER — Telehealth (INDEPENDENT_AMBULATORY_CARE_PROVIDER_SITE_OTHER): Payer: Self-pay

## 2012-12-02 NOTE — Telephone Encounter (Signed)
I called patient and gave her benign path results. Patient thanked me and wanted me to send Cornett a note that he did a wonderful job and that she has not had any problems after surgery.

## 2012-12-14 ENCOUNTER — Telehealth: Payer: Self-pay | Admitting: Genetic Counselor

## 2012-12-14 NOTE — Telephone Encounter (Signed)
Left good news message about genetic test results.  Asked that she call back.

## 2012-12-15 ENCOUNTER — Telehealth: Payer: Self-pay | Admitting: Genetic Counselor

## 2012-12-15 NOTE — Telephone Encounter (Signed)
Revealed negative genetic test results 

## 2012-12-19 ENCOUNTER — Telehealth (INDEPENDENT_AMBULATORY_CARE_PROVIDER_SITE_OTHER): Payer: Self-pay | Admitting: General Surgery

## 2012-12-19 ENCOUNTER — Encounter (INDEPENDENT_AMBULATORY_CARE_PROVIDER_SITE_OTHER): Payer: Self-pay | Admitting: Surgery

## 2012-12-19 ENCOUNTER — Ambulatory Visit (INDEPENDENT_AMBULATORY_CARE_PROVIDER_SITE_OTHER): Payer: 59 | Admitting: Surgery

## 2012-12-19 VITALS — BP 120/82 | HR 92 | Temp 97.3°F | Resp 18 | Ht 63.0 in | Wt 161.0 lb

## 2012-12-19 DIAGNOSIS — Z853 Personal history of malignant neoplasm of breast: Secondary | ICD-10-CM

## 2012-12-19 NOTE — Patient Instructions (Signed)
Return as needed.  Refer to plastics.

## 2012-12-19 NOTE — Telephone Encounter (Signed)
Left message  On voicemail patient has appt on 12/20/12 at 215 with Dr Odis Luster

## 2012-12-19 NOTE — Progress Notes (Signed)
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 No residual disease was found. History of left breast cancer in 2003. Underwent genetic testing which was negative. No complaints.  Exam: Right breast clean dry and intact. No signs of infection.  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 Return as needed.

## 2012-12-26 ENCOUNTER — Other Ambulatory Visit: Payer: Self-pay | Admitting: Internal Medicine

## 2012-12-26 NOTE — Telephone Encounter (Signed)
Hopp please advise, Lorazepam last filled 11/22/12, Clonazepam last filled 11/17/2012, last OV 11/17/2012  Side Note, patient will have to pick-up rx's and sign a contract

## 2012-12-26 NOTE — Telephone Encounter (Signed)
Spoke with patient, patient verbalized understanding of Dr.Hopper's response.

## 2012-12-26 NOTE — Telephone Encounter (Signed)
I consulted with a neuro chemist; it would be safer and more efficacious to use the clonazepam rather than the combination. Please take clonazepam every 8-12 hours as needed only. Dispense 30. Discontinue the lorazepam. These recommendations are based on that expert's opinion.

## 2012-12-27 ENCOUNTER — Encounter: Payer: Self-pay | Admitting: Genetic Counselor

## 2013-04-24 LAB — HM PAP SMEAR

## 2013-04-26 ENCOUNTER — Telehealth: Payer: Self-pay | Admitting: *Deleted

## 2013-04-26 NOTE — Telephone Encounter (Signed)
Refill request for clonazepam 0.5mg   Last ov 11/17/12 Last date filled 12/26/12 #30 0 R Last UDS 09/16/12 Low risk Patient has a contract  This is a hopp patient  Please Advise  Ag cma

## 2013-04-27 NOTE — Telephone Encounter (Signed)
Ok for #30 

## 2013-04-28 ENCOUNTER — Other Ambulatory Visit: Payer: Self-pay | Admitting: General Practice

## 2013-04-28 MED ORDER — CLONAZEPAM 0.5 MG PO TABS: ORAL_TABLET | ORAL | Status: DC

## 2013-04-28 NOTE — Telephone Encounter (Signed)
Med filled.  

## 2013-06-04 ENCOUNTER — Ambulatory Visit (INDEPENDENT_AMBULATORY_CARE_PROVIDER_SITE_OTHER): Payer: BC Managed Care – PPO | Admitting: Physician Assistant

## 2013-06-04 VITALS — BP 152/96 | HR 82 | Temp 98.4°F | Resp 18 | Ht 63.0 in | Wt 160.4 lb

## 2013-06-04 DIAGNOSIS — R21 Rash and other nonspecific skin eruption: Secondary | ICD-10-CM

## 2013-06-04 DIAGNOSIS — L299 Pruritus, unspecified: Secondary | ICD-10-CM

## 2013-06-04 MED ORDER — CETIRIZINE HCL 10 MG PO TABS: 10.0000 mg | ORAL_TABLET | Freq: Every day | ORAL | Status: DC

## 2013-06-04 MED ORDER — HYDROXYZINE HCL 25 MG PO TABS: 25.0000 mg | ORAL_TABLET | Freq: Three times a day (TID) | ORAL | Status: DC | PRN

## 2013-06-04 MED ORDER — RANITIDINE HCL 300 MG PO TABS: 300.0000 mg | ORAL_TABLET | Freq: Every day | ORAL | Status: DC

## 2013-06-04 MED ORDER — METHYLPREDNISOLONE ACETATE 80 MG/ML IJ SUSP: 80.0000 mg | Freq: Once | INTRAMUSCULAR | Status: DC

## 2013-06-04 NOTE — Progress Notes (Signed)
   [address], [place] [address]   Phone [phone]  Subjective:    Patient ID: Desiree Andrews, female    DOB: 31-Dec-1955, 57 y.o.   MRN: [id number]  HPI Pt presents to clinic with rash on her legs and arms for the last 2 weeks - she has done nothing for the rash.  It started one evening when she was sitting outside and she thought she was getting bitten by mosquitoes but she continues to get more rash.  It is very itchy and she cannot stop scratching.  She has had similar rash in the past - she has gotten this rash for about the last 2 years 1-2 times a year and she has gotten a steroid injection but states it never helps - she has never had the rash biopsied - no one at home has a similar rash - she has not been doing yard work (in the past she has thought it was poison ivy).  Rash and itching gets worse when she gets hot.  Review of Systems  Constitutional: Negative for fever and chills.  Skin: Positive for rash.       Objective:   Physical Exam  Vitals reviewed. Constitutional: She is oriented to person, place, and time. She appears well-developed and well-nourished.  HENT:  Head: Normocephalic and atraumatic.  Right Ear: External ear normal.  Left Ear: External ear normal.  Eyes: Conjunctivae are normal.  Neck: Normal range of motion.  Pulmonary/Chest: Effort normal.  Neurological: She is alert and oriented to person, place, and time.  Skin: Skin is warm and dry. Rash noted. Rash is papular (papules with central erythema and sourrounding blanched area - the rash blanches - ). Rash is not pustular and not vesicular.     Psychiatric: She has a normal mood and affect. Her behavior is normal. Judgment and thought content normal.       Assessment & Plan:  Rash and nonspecific skin eruption - Plan: Dermatology pathology, ranitidine (ZANTAC) 300 MG tablet, cetirizine (ZYRTEC) 10 MG tablet  Itchy skin - Plan: hydrOXYzine (ATARAX/VISTARIL) 25 MG tablet  This looks like a  histamine reaction and a scratch-itch rash - there are no pustules or vesicles - pt has used no medications and while we are waiting for the biopsy results we will use medications that reduce histamine and stop her itching because I think she is scratching which increases her itching - she will decrease se of hot water and try to keep her body cool because heat will worsen her rash.  If the itching does not improve over te next 4 days we will call in a steroid dose pack because she is starting to work with [employer] and she is really worried about the long hours and the itching.  Benny Lennert PA-C 06/04/2013 10:01 AM

## 2013-06-06 ENCOUNTER — Other Ambulatory Visit: Payer: Self-pay

## 2013-06-06 MED ORDER — PREDNISONE 10 MG PO KIT: PACK | ORAL | Status: DC

## 2013-06-06 NOTE — Telephone Encounter (Signed)
Patient advised.

## 2013-06-06 NOTE — Telephone Encounter (Signed)
Patient calling to get a rx request before work at [employer] today. She was told to call in for prednisone per Benny Lennert if her current rash medication is not working. She would like the rx sent to [facility] at [address].  Patient wants to know if she can continue previous rash medication with the prednisone. Please advise.   [phone]

## 2013-06-06 NOTE — Telephone Encounter (Signed)
Maralyn Sago note indicates dose pack, please advise pended.

## 2013-06-30 ENCOUNTER — Other Ambulatory Visit: Payer: Self-pay | Admitting: Internal Medicine

## 2013-06-30 NOTE — Telephone Encounter (Signed)
Metoprolol refill sent to pharamcy 

## 2013-08-04 ENCOUNTER — Other Ambulatory Visit: Payer: Self-pay | Admitting: Internal Medicine

## 2013-08-10 ENCOUNTER — Telehealth: Payer: Self-pay | Admitting: Internal Medicine

## 2013-08-10 NOTE — Telephone Encounter (Signed)
Patient is requesting a refill on her clonazePAM (KLONOPIN) 0.5 MG to be sent to [facility] on [address]. Please advise.

## 2013-08-10 NOTE — Telephone Encounter (Signed)
Requesting refill on Klonopin 0.5mg  Last refill:04-28-13 Last no recent OV Please advise.//AB/CMA

## 2013-08-10 NOTE — Telephone Encounter (Signed)
OK X1 

## 2013-08-11 ENCOUNTER — Other Ambulatory Visit: Payer: Self-pay | Admitting: *Deleted

## 2013-08-11 MED ORDER — CLONAZEPAM 0.5 MG PO TABS: ORAL_TABLET | ORAL | Status: DC

## 2013-08-11 NOTE — Telephone Encounter (Signed)
Clonazepam script faxed to pharmacy. JG//CMA

## 2013-09-11 ENCOUNTER — Telehealth: Payer: Self-pay

## 2013-09-11 NOTE — Telephone Encounter (Signed)
Medication and allergies:  Reviewed and updated  90 day supply/mail order: n/a Local pharmacy: [facility]   Immunizations due: Influenza vaccine to receive during appt.     A/P: No changes to personal, family history or past surgical hx PAP- 04/24/2013 [facility]- Needs to schedule MMG- 10/19/12-Abnormal: L Breast Calcifications, R Breast Mass Tdap-05/06/11  To Discuss with Provider:  "Worried about cholesterol" Would like to get back on Ambien Wants to know who to go to for Bone density test. Wants to discuss provider options after retirement.

## 2013-09-13 ENCOUNTER — Ambulatory Visit (INDEPENDENT_AMBULATORY_CARE_PROVIDER_SITE_OTHER): Payer: BC Managed Care – PPO | Admitting: Internal Medicine

## 2013-09-13 ENCOUNTER — Encounter: Payer: Self-pay | Admitting: Internal Medicine

## 2013-09-13 VITALS — BP 151/87 | HR 81 | Temp 98.6°F | Ht 62.5 in | Wt 164.6 lb

## 2013-09-13 DIAGNOSIS — M949 Disorder of cartilage, unspecified: Secondary | ICD-10-CM

## 2013-09-13 DIAGNOSIS — E782 Mixed hyperlipidemia: Secondary | ICD-10-CM

## 2013-09-13 DIAGNOSIS — I10 Essential (primary) hypertension: Secondary | ICD-10-CM

## 2013-09-13 DIAGNOSIS — Z0000 Encounter for general adult medical examination without abnormal findings: Secondary | ICD-10-CM

## 2013-09-13 DIAGNOSIS — M899 Disorder of bone, unspecified: Secondary | ICD-10-CM

## 2013-09-13 DIAGNOSIS — R7309 Other abnormal glucose: Secondary | ICD-10-CM | POA: Insufficient documentation

## 2013-09-13 MED ORDER — METOPROLOL TARTRATE 25 MG PO TABS: 25.0000 mg | ORAL_TABLET | Freq: Two times a day (BID) | ORAL | Status: DC

## 2013-09-13 NOTE — Progress Notes (Signed)
Pre visit review using our clinic review tool, if applicable. No additional management support is needed unless otherwise documented below in the visit note. 

## 2013-09-13 NOTE — Progress Notes (Signed)
   Subjective:    Patient ID: Desiree Andrews, female    DOB: 10-18-1955, 58 y.o.   MRN: [id number]  HPI   She is here for a physical;acute issues denied.     Review of Systems A heart healthy diet is followed; no regular exercise . Family history is negative for premature coronary disease. Advanced cholesterol testing reveals  LDL goal is less than 100 ; ideally < 70 . Intolerant to Pravastatin   Low dose ASA not taken Specifically denied are  chest pain, palpitations, dyspnea, or claudication.  BP @ home not monitored    Objective:   Physical Exam Gen.: Healthy and well-nourished in appearance. Alert, appropriate and cooperative throughout exam. Appears younger than stated age  Head: Normocephalic without obvious abnormalities Eyes: No corneal or conjunctival inflammation noted. Pupils equal round reactive to light and accommodation. Extraocular motion intact. Ears: External  ear exam reveals no significant lesions or deformities. Canals clear .TMs normal. Hearing is grossly normal bilaterally. Nose: External nasal exam reveals no deformity or inflammation. Nasal mucosa are pink and moist. No lesions or exudates noted.   Mouth: Oral mucosa and oropharynx reveal no lesions or exudates. Teeth in good repair. Neck: No deformities, masses, or tenderness noted. Range of motion &. Thyroid normal. Lungs: Normal respiratory effort; chest expands symmetrically. Lungs are clear to auscultation without rales, wheezes, or increased work of breathing. Heart: Normal rate and rhythm. Normal S1 and S2. No gallop, click, or rub. S4 w/o murmur. Abdomen: Bowel sounds normal; abdomen soft and nontender. No masses, organomegaly or hernias noted. Genitalia:  as per Gyn                                  Musculoskeletal/extremities: No deformity or scoliosis noted of  the thoracic or lumbar spine.  No clubbing, cyanosis, edema, or significant extremity  deformity noted. Range of motion normal .Tone &  strength normal. Hand joints normal . Fingernail / toenail health good. Able to lie down & sit up w/o help. Negative SLR bilaterally Vascular: Carotid, radial artery, dorsalis pedis and  posterior tibial pulses are full and equal. No bruits present. Neurologic: Alert and oriented x3. Deep tendon reflexes symmetrical and normal.         Skin: Intact without suspicious lesions or rashes. Lymph: No cervical, axillary lymphadenopathy present. Psych: Mood and affect are normal. Normally interactive                                                                                       Assessment & Plan:  #1 comprehensive physical exam; no acute findings  Plan: see Orders  & Recommendations

## 2013-09-13 NOTE — Patient Instructions (Signed)
Your next office appointment will be determined based upon review of your pending labs . Those instructions will be transmitted to you through My Chart  or by mail if you're not using this system.   Minimal Blood Pressure Goal= AVERAGE < 140/90;  Ideal is an AVERAGE < 135/85. This AVERAGE should be calculated from @ least 5-7 BP readings taken @ different times of day on different days of week. You should not respond to isolated BP readings , but rather the AVERAGE for that week .Please bring your  blood pressure cuff to office visits to verify that it is reliable.It  can also be checked against the blood pressure device at the pharmacy. Finger or wrist cuffs are not dependable; an arm cuff is. 

## 2013-09-14 ENCOUNTER — Telehealth: Payer: Self-pay | Admitting: Internal Medicine

## 2013-09-14 NOTE — Telephone Encounter (Signed)
Relevant patient education assigned to patient using Emmi. ° °

## 2013-09-18 ENCOUNTER — Other Ambulatory Visit: Payer: Self-pay

## 2013-09-18 ENCOUNTER — Other Ambulatory Visit: Payer: Self-pay | Admitting: Internal Medicine

## 2013-09-18 DIAGNOSIS — Z9889 Other specified postprocedural states: Secondary | ICD-10-CM

## 2013-09-18 DIAGNOSIS — Z853 Personal history of malignant neoplasm of breast: Secondary | ICD-10-CM

## 2013-09-18 DIAGNOSIS — Z1231 Encounter for screening mammogram for malignant neoplasm of breast: Secondary | ICD-10-CM

## 2013-09-27 ENCOUNTER — Other Ambulatory Visit (INDEPENDENT_AMBULATORY_CARE_PROVIDER_SITE_OTHER): Payer: BC Managed Care – PPO

## 2013-09-27 DIAGNOSIS — Z0000 Encounter for general adult medical examination without abnormal findings: Secondary | ICD-10-CM

## 2013-09-27 LAB — BASIC METABOLIC PANEL
BUN: 10 mg/dL (ref 6–23)
CHLORIDE: 104 meq/L (ref 96–112)
CO2: 26 meq/L (ref 19–32)
Calcium: 9.5 mg/dL (ref 8.4–10.5)
Creatinine, Ser: 0.7 mg/dL (ref 0.4–1.2)
GFR: 89.42 mL/min (ref >60.00)
GLUCOSE: 94 mg/dL (ref 70–99)
POTASSIUM: 4.4 meq/L (ref 3.5–5.1)
SODIUM: 140 meq/L (ref 135–145)

## 2013-09-27 LAB — CBC WITH DIFFERENTIAL/PLATELET
BASOS ABS: 0 10*3/uL (ref 0.0–0.1)
BASOS PCT: 0.5 % (ref 0.0–3.0)
EOS ABS: 0.2 10*3/uL (ref 0.0–0.7)
Eosinophils Relative: 2.5 % (ref 0.0–5.0)
HEMATOCRIT: 46.1 % — AB (ref 36.0–46.0)
HEMOGLOBIN: 15.2 g/dL — AB (ref 12.0–15.0)
LYMPHS ABS: 2.8 10*3/uL (ref 0.7–4.0)
LYMPHS PCT: 32.7 % (ref 12.0–46.0)
MCHC: 33 g/dL (ref 30.0–36.0)
MCV: 96.3 fl (ref 78.0–100.0)
Monocytes Absolute: 0.6 10*3/uL (ref 0.1–1.0)
Monocytes Relative: 6.7 % (ref 3.0–12.0)
NEUTROS ABS: 4.9 10*3/uL (ref 1.4–7.7)
Neutrophils Relative %: 57.6 % (ref 43.0–77.0)
Platelets: 285 10*3/uL (ref 150.0–400.0)
RBC: 4.79 Mil/uL (ref 3.87–5.11)
RDW: 12.5 % (ref 11.5–14.6)
WBC: 8.4 10*3/uL (ref 4.5–10.5)

## 2013-09-27 LAB — HEPATIC FUNCTION PANEL
ALBUMIN: 4.3 g/dL (ref 3.5–5.2)
ALK PHOS: 65 U/L (ref 39–117)
ALT: 35 U/L (ref 0–35)
AST: 30 U/L (ref 0–37)
Bilirubin, Direct: 0 mg/dL (ref 0.0–0.3)
TOTAL PROTEIN: 7.5 g/dL (ref 6.0–8.3)
Total Bilirubin: 0.6 mg/dL (ref 0.3–1.2)

## 2013-09-27 LAB — LDL CHOLESTEROL, DIRECT: LDL DIRECT: 164.5 mg/dL

## 2013-09-27 LAB — LIPID PANEL
CHOLESTEROL: 261 mg/dL — AB (ref 0–200)
HDL: 49.4 mg/dL (ref >39.00)
Total CHOL/HDL Ratio: 5
Triglycerides: 284 mg/dL — ABNORMAL HIGH (ref 0.0–149.0)
VLDL: 56.8 mg/dL — ABNORMAL HIGH (ref 0.0–40.0)

## 2013-09-27 LAB — MAGNESIUM: MAGNESIUM: 1.9 mg/dL (ref 1.5–2.5)

## 2013-09-27 LAB — TSH: TSH: 1.11 u[IU]/mL (ref 0.35–5.50)

## 2013-09-28 ENCOUNTER — Other Ambulatory Visit: Payer: Self-pay | Admitting: Internal Medicine

## 2013-09-28 ENCOUNTER — Encounter: Payer: Self-pay | Admitting: General Practice

## 2013-09-28 DIAGNOSIS — E782 Mixed hyperlipidemia: Secondary | ICD-10-CM

## 2013-10-02 LAB — VITAMIN D 1,25 DIHYDROXY
VITAMIN D 1, 25 (OH) TOTAL: 81 pg/mL — AB (ref 18–72)
VITAMIN D3 1, 25 (OH): 81 pg/mL
Vitamin D2 1, 25 (OH)2: <8 pg/mL

## 2013-10-11 ENCOUNTER — Ambulatory Visit: Payer: BC Managed Care – PPO

## 2013-10-16 ENCOUNTER — Encounter: Payer: Self-pay | Admitting: *Deleted

## 2013-10-17 ENCOUNTER — Telehealth: Payer: Self-pay | Admitting: *Deleted

## 2013-10-17 ENCOUNTER — Other Ambulatory Visit: Payer: Self-pay | Admitting: Internal Medicine

## 2013-10-17 DIAGNOSIS — M949 Disorder of cartilage, unspecified: Principal | ICD-10-CM

## 2013-10-17 DIAGNOSIS — M899 Disorder of bone, unspecified: Secondary | ICD-10-CM

## 2013-10-17 NOTE — Telephone Encounter (Signed)
Pt aware that referral has been ordered.//AB/CMA

## 2013-10-17 NOTE — Telephone Encounter (Signed)
Pt wanting to have a referral for a Bone Density Scan.  She is going [facility] to have [facility]amm[facility] and she would like to get both test done at the same time.  Her last BDS was (06-26-2009).  Please advise.//AB/CMA

## 2013-10-17 NOTE — Telephone Encounter (Signed)
Done

## 2013-11-01 ENCOUNTER — Other Ambulatory Visit: Payer: Self-pay | Admitting: Internal Medicine

## 2013-11-01 NOTE — Telephone Encounter (Signed)
Requesting Clonazepam 0..5mg  Take 1 tablet by mouth every 8-12 hours as needed. Last refill:08-11-13;#30,0 Last OV:09-13-13 Please advise.//AB/CMA

## 2013-11-01 NOTE — Telephone Encounter (Signed)
Rx printed and faxed to pharmacy([facility]).//AB/CMA

## 2013-11-01 NOTE — Telephone Encounter (Signed)
OK X1 

## 2013-11-03 ENCOUNTER — Ambulatory Visit
Admission: RE | Admit: 2013-11-03 | Discharge: 2013-11-03 | Disposition: A | Discharge status: Home / Self Care | Payer: BC Managed Care – PPO | Source: Ambulatory Visit | Attending: Internal Medicine | Admitting: Internal Medicine

## 2013-11-03 ENCOUNTER — Ambulatory Visit
Admission: RE | Admit: 2013-11-03 | Discharge: 2013-11-03 | Disposition: A | Discharge status: Home / Self Care | Payer: BC Managed Care – PPO | Source: Ambulatory Visit

## 2013-11-03 DIAGNOSIS — M949 Disorder of cartilage, unspecified: Principal | ICD-10-CM

## 2013-11-03 DIAGNOSIS — Z1231 Encounter for screening mammogram for malignant neoplasm of breast: Secondary | ICD-10-CM

## 2013-11-03 DIAGNOSIS — M899 Disorder of bone, unspecified: Secondary | ICD-10-CM

## 2013-11-03 DIAGNOSIS — Z9889 Other specified postprocedural states: Secondary | ICD-10-CM

## 2013-11-03 DIAGNOSIS — Z853 Personal history of malignant neoplasm of breast: Secondary | ICD-10-CM

## 2013-11-06 ENCOUNTER — Encounter: Payer: Self-pay | Admitting: *Deleted

## 2013-12-01 ENCOUNTER — Ambulatory Visit: Payer: BC Managed Care – PPO | Admitting: Family Medicine

## 2014-01-16 ENCOUNTER — Other Ambulatory Visit: Payer: Self-pay | Admitting: Internal Medicine

## 2014-01-16 NOTE — Telephone Encounter (Signed)
OK X1 

## 2014-01-16 NOTE — Telephone Encounter (Signed)
Last ov 09/13/13 Med last filled 11/01/13 #30

## 2014-01-18 ENCOUNTER — Encounter: Payer: Self-pay | Admitting: Internal Medicine

## 2014-01-18 ENCOUNTER — Ambulatory Visit: Payer: BC Managed Care – PPO | Admitting: Internal Medicine

## 2014-01-18 ENCOUNTER — Ambulatory Visit (INDEPENDENT_AMBULATORY_CARE_PROVIDER_SITE_OTHER): Payer: BC Managed Care – PPO | Admitting: Internal Medicine

## 2014-01-18 VITALS — BP 140/80 | HR 108 | Temp 98.2°F | Resp 14 | Wt 161.6 lb

## 2014-01-18 DIAGNOSIS — R6889 Other general symptoms and signs: Secondary | ICD-10-CM

## 2014-01-18 DIAGNOSIS — E782 Mixed hyperlipidemia: Secondary | ICD-10-CM

## 2014-01-18 DIAGNOSIS — K130 Diseases of lips: Secondary | ICD-10-CM

## 2014-01-18 DIAGNOSIS — E785 Hyperlipidemia, unspecified: Secondary | ICD-10-CM

## 2014-01-18 NOTE — Progress Notes (Signed)
Pre visit review using our clinic review tool, if applicable. No additional management support is needed unless otherwise documented below in the visit note. 

## 2014-01-18 NOTE — Progress Notes (Signed)
   Subjective:    Patient ID: Desiree Andrews, female    DOB: Jun 27, 1956, 58 y.o.   MRN: [id number]  HPI  In April she developed shingles of the posterior thorax. She was seen at urgent care and Valtrex prescribed for 7 days.  Last 2.5 week she's noted some crusting and swelling of her lips. A friend gave her some Zovirax ointment to use which has been of some benefit  She specifically denies significant itchy, watery eyes, sneezing  She has no swelling of her tongue or airway compromise  She is not on an ACE inhibitor   Review of Systems  She has no fever, chills, or sweats  She has no cough or sputum production.   She questions whether she should start cholesterol medicine. I have asked her to review her last report with my comments concerning risk and options.       Objective:   Physical Exam General appearance:good health ;well nourished; no acute distress or increased work of breathing is present.  No  lymphadenopathy about the head, neck, or axilla noted.   Eyes: No conjunctival inflammation or lid edema is present. There is no scleral icterus.  Ears:  External ear exam shows no significant lesions or deformities.   Nose:  External nasal examination shows no deformity or inflammation. Nasal mucosa are pink and moist without lesions or exudates. No septal dislocation or deviation.No obstruction to airflow.   Oral exam: Lips are minimally dry without any significant lesions.Dental hygiene is good; lips and gums are healthy appearing.There is no oropharyngeal erythema or exudate noted.   Neck:  No deformities, thyromegaly, masses, or tenderness noted.   Supple with full range of motion without pain.   Heart:  Normal rate and regular rhythm. S1 and S2 normal without gallop, murmur, click, rub or other extra sounds.   Lungs:Chest clear to auscultation; no wheezes, rhonchi,rales ,or rubs present.No increased work of breathing.    She has no  hepatosplenomegaly.   Extremities:  No cyanosis, edema, or clubbing  noted    Skin: Warm & dry w/o jaundice or tenting.         Assessment & Plan:  #1 lip rash, ? Drying . No herpes simples #2 dyslipidemia See AVS

## 2014-01-18 NOTE — Patient Instructions (Addendum)
   Please use A and D ointment as per the package insert for the lip drying.  If you take generic Zyrtec, take it at bedtime as it can be sedating. Please do not take the Zyrtec-D form. It can cause hypertension and palpitations.  Please review your last lipid values with my comments concerning your long-term risk and options. Let me know if you do want to address the risk with a low dose statin. TLC recommended initially because of high TG.

## 2014-01-19 NOTE — Assessment & Plan Note (Addendum)
TLC recommended with fasting lipids after 4 mos Orders previously entered

## 2014-02-11 ENCOUNTER — Ambulatory Visit (INDEPENDENT_AMBULATORY_CARE_PROVIDER_SITE_OTHER): Payer: BC Managed Care – PPO | Admitting: Family Medicine

## 2014-02-11 VITALS — BP 130/88 | HR 16 | Temp 97.6°F | Resp 83 | Ht 62.5 in | Wt 160.8 lb

## 2014-02-11 DIAGNOSIS — J029 Acute pharyngitis, unspecified: Secondary | ICD-10-CM

## 2014-02-11 DIAGNOSIS — J301 Allergic rhinitis due to pollen: Secondary | ICD-10-CM

## 2014-02-11 MED ORDER — AMOXICILLIN 875 MG PO TABS: 875.0000 mg | ORAL_TABLET | Freq: Two times a day (BID) | ORAL | Status: DC

## 2014-02-11 NOTE — Patient Instructions (Signed)

## 2014-02-11 NOTE — Progress Notes (Signed)
Chief Complaint:  Chief Complaint  Patient presents with  . Sore Throat    x 3 days     HPI: Desiree Andrews is a 58 y.o. female who is here for  3 days of allergy sxs and sore throat, associated with watery dc from throat and right before all this started she had mouth swelling and crusty, chapped lips. No known trigers.  She has had shingles in the past She has tried zyrtec without consistency,  she feels it does not seem to help She has also tried Aleve due to sore throat, + minimal cough , denies fevers, no prior sinus problems, she sometimes gets eczema yearly so this may be just allergies b  Past Medical History  Diagnosis Date  . Cancer     breast  . Hyperlipidemia   . Hypertension   . Cardiopulmonary arrest 1991    Postpartum,? Pulmonary thromboembolism  . Anxiety   . Depression     off cymbalta now   Past Surgical History  Procedure Laterality Date  . Cardiovascular stress test  2005    Neg  . Colonoscopy  2006    Normal  . Wrist surgery  2012    Left, Dr.Rowen  . Breast lumpectomy  2003    snbx-Left, Dr.Rubin; Radiation & Chemotherapy X3  . Breast lumpectomy with needle localization Right 11/30/2012    Procedure: BREAST LUMPECTOMY WITH NEEDLE LOCALIZATION;  Surgeon: Marcello Moores A. Cornett, MD;  Location: [facility];  Service: General;  Laterality: Right;  needle localization at [facility] 8:00    History   Social History  . Marital Status: Divorced    Spouse Name: N/A    Number of Children: N/A  . Years of Education: N/A   Social History Main Topics  . Smoking status: Former Smoker    Quit date: 08/24/1990  . Smokeless tobacco: Never Used     Comment: smoked [place], up to < 1 ppd  . Alcohol Use: 8.4 oz/week    14 Glasses of wine per week     Comment: Socially  . Drug Use: No  . Sexual Activity: None   Other Topics Concern  . None   Social History Narrative  . None   Family History  Problem Relation Age of Onset   . Diabetes Mother   . Hypertension Mother   . Hyperlipidemia Mother   . Stroke Mother 57    02/2009  . Heart attack Maternal Grandmother     in her 37s  . Stomach cancer Maternal Grandfather 70   Allergies  Allergen Reactions  . Pravastatin     Sleep disturbance   Prior to Admission medications   Medication Sig Start Date End Date Taking? Authorizing Provider  cetirizine (ZYRTEC) 10 MG tablet Take 10 mg by mouth as needed. 06/04/13  Yes Sarah Alleen Borne, PA-C  clonazePAM (KLONOPIN) 0.5 MG tablet TAKE 1 TABLET BY MOUTH EVERY 8-12 HOURS AS NEEDED 01/16/14  Yes Hendricks Limes, MD  hydrOXYzine (ATARAX/VISTARIL) 25 MG tablet Take 1 tablet (25 mg total) by mouth 3 (three) times daily as needed for itching. 06/04/13  Yes Mancel Bale, PA-C  metoprolol tartrate (LOPRESSOR) 25 MG tablet Take 1 tablet (25 mg total) by mouth 2 (two) times daily. 09/13/13  Yes Hendricks Limes, MD     ROS: The patient denies fevers, chills, night sweats, unintentional weight loss, chest pain, palpitations, wheezing, dyspnea on exertion, nausea, vomiting, abdominal  pain, dysuria, hematuria, melena, numbness, weakness, or tingling.   All other systems have been reviewed and were otherwise negative with the exception of those mentioned in the HPI and as above.    PHYSICAL EXAM: Filed Vitals:   02/11/14 1002  BP: 130/88  Pulse: 16  Temp: 97.6 F (36.4 C)  Resp: 83   Filed Vitals:   02/11/14 1002  Height: 5' 2.5" (1.588 m)  Weight: 160 lb 12.8 oz (72.938 kg)   Body mass index is 28.92 kg/(m^2).  General: Alert, no acute distress HEENT:  Normocephalic, atraumatic, oropharynx patent. EOMI, PERRLA. TM is nl, no sinus tenderness, no exudates.  Cardiovascular:  Regular rate and rhythm, no rubs murmurs or gallops.  No Carotid bruits, radial pulse intact. No pedal edema.  Respiratory: Clear to auscultation bilaterally.  No wheezes, rales, or rhonchi.  No cyanosis, no use of accessory musculature GI: No  organomegaly, abdomen is soft and non-tender, positive bowel sounds.  No masses. Skin: No rashes. Neurologic: Facial musculature symmetric. Psychiatric: Patient is appropriate throughout our interaction. Lymphatic: No cervical lymphadenopathy Musculoskeletal: Gait intact.   LABS: Results for orders placed in visit on 12/45/80  BASIC METABOLIC PANEL      Result Value Ref Range   Sodium 140  135 - 145 mEq/L   Potassium 4.4  3.5 - 5.1 mEq/L   Chloride 104  96 - 112 mEq/L   CO2 26  19 - 32 mEq/L   Glucose, Bld 94  70 - 99 mg/dL   BUN 10  6 - 23 mg/dL   Creatinine, Ser 0.7  0.4 - 1.2 mg/dL   Calcium 9.5  8.4 - 10.5 mg/dL   GFR 89.42  >60.00 mL/min  CBC WITH DIFFERENTIAL      Result Value Ref Range   WBC 8.4  4.5 - 10.5 K/uL   RBC 4.79  3.87 - 5.11 Mil/uL   Hemoglobin 15.2 (*) 12.0 - 15.0 g/dL   HCT 46.1 (*) 36.0 - 46.0 %   MCV 96.3  78.0 - 100.0 fl   MCHC 33.0  30.0 - 36.0 g/dL   RDW 12.5  11.5 - 14.6 %   Platelets 285.0  150.0 - 400.0 K/uL   Neutrophils Relative % 57.6  43.0 - 77.0 %   Lymphocytes Relative 32.7  12.0 - 46.0 %   Monocytes Relative 6.7  3.0 - 12.0 %   Eosinophils Relative 2.5  0.0 - 5.0 %   Basophils Relative 0.5  0.0 - 3.0 %   Neutro Abs 4.9  1.4 - 7.7 K/uL   Lymphs Abs 2.8  0.7 - 4.0 K/uL   Monocytes Absolute 0.6  0.1 - 1.0 K/uL   Eosinophils Absolute 0.2  0.0 - 0.7 K/uL   Basophils Absolute 0.0  0.0 - 0.1 K/uL  HEPATIC FUNCTION PANEL      Result Value Ref Range   Total Bilirubin 0.6  0.3 - 1.2 mg/dL   Bilirubin, Direct 0.0  0.0 - 0.3 mg/dL   Alkaline Phosphatase 65  39 - 117 U/L   AST 30  0 - 37 U/L   ALT 35  0 - 35 U/L   Total Protein 7.5  6.0 - 8.3 g/dL   Albumin 4.3  3.5 - 5.2 g/dL  LIPID PANEL      Result Value Ref Range   Cholesterol 261 (*) 0 - 200 mg/dL   Triglycerides 284.0 (*) 0.0 - 149.0 mg/dL   HDL 49.40  >39.00 mg/dL   VLDL 56.8 (*)  0.0 - 40.0 mg/dL   Total CHOL/HDL Ratio 5    TSH      Result Value Ref Range   TSH 1.11  0.35 - 5.50  uIU/mL  VITAMIN D 1,25 DIHYDROXY      Result Value Ref Range   Vitamin D 1, 25 (OH)2 Total 81 (*) 18 - 72 pg/mL   Vitamin D3 1, 25 (OH)2 81     Vitamin D2 1, 25 (OH)2 <8    MAGNESIUM      Result Value Ref Range   Magnesium 1.9  1.5 - 2.5 mg/dL  LDL CHOLESTEROL, DIRECT      Result Value Ref Range   Direct LDL 164.5       EKG/XRAY:   Primary read interpreted by Dr. Marin Comment at [facility].   ASSESSMENT/PLAN: Encounter Diagnoses  Name Primary?  . Allergic rhinitis due to pollen Yes  . Acute pharyngitis, unspecified pharyngitis type    OTC cepachol Rx Amoxacillin  if nasacort , zyrtec does not help F/u prn  Gross sideeffects, risk and benefits, and alternatives of medications d/w patient. Patient is aware that all medications have potential sideeffects and we are unable to predict every sideeffect or drug-drug interaction that may occur.  LE, [place], DO 02/11/2014 10:49 AM

## 2014-04-12 ENCOUNTER — Other Ambulatory Visit: Payer: Self-pay | Admitting: Internal Medicine

## 2014-04-12 NOTE — Telephone Encounter (Signed)
01/18/14 last office visit  01/16/14 medication last filled #30 no refills

## 2014-04-12 NOTE — Telephone Encounter (Signed)
OK X1 

## 2014-06-07 ENCOUNTER — Encounter: Payer: Self-pay | Admitting: Internal Medicine

## 2014-06-07 ENCOUNTER — Ambulatory Visit (INDEPENDENT_AMBULATORY_CARE_PROVIDER_SITE_OTHER): Payer: BC Managed Care – PPO | Admitting: Internal Medicine

## 2014-06-07 ENCOUNTER — Other Ambulatory Visit (INDEPENDENT_AMBULATORY_CARE_PROVIDER_SITE_OTHER): Payer: BC Managed Care – PPO

## 2014-06-07 VITALS — BP 150/98 | HR 81 | Temp 98.2°F | Wt 165.0 lb

## 2014-06-07 DIAGNOSIS — J309 Allergic rhinitis, unspecified: Secondary | ICD-10-CM

## 2014-06-07 DIAGNOSIS — Z23 Encounter for immunization: Secondary | ICD-10-CM

## 2014-06-07 LAB — CBC WITH DIFFERENTIAL/PLATELET
Basophils Absolute: 0.1 10*3/uL (ref 0.0–0.1)
Basophils Relative: 1 % (ref 0.0–3.0)
Eosinophils Absolute: 0.4 10*3/uL (ref 0.0–0.7)
Eosinophils Relative: 5.9 % — ABNORMAL HIGH (ref 0.0–5.0)
HCT: 44.8 % (ref 36.0–46.0)
Hemoglobin: 15.1 g/dL — ABNORMAL HIGH (ref 12.0–15.0)
Lymphocytes Relative: 43 % (ref 12.0–46.0)
Lymphs Abs: 3.2 10*3/uL (ref 0.7–4.0)
MCHC: 33.6 g/dL (ref 30.0–36.0)
MCV: 95.3 fl (ref 78.0–100.0)
Monocytes Absolute: 0.6 10*3/uL (ref 0.1–1.0)
Monocytes Relative: 8.3 % (ref 3.0–12.0)
NEUTROS PCT: 41.8 % — AB (ref 43.0–77.0)
Neutro Abs: 3.1 10*3/uL (ref 1.4–7.7)
PLATELETS: 272 10*3/uL (ref 150.0–400.0)
RBC: 4.71 Mil/uL (ref 3.87–5.11)
RDW: 12.9 % (ref 11.5–15.5)
WBC: 7.4 10*3/uL (ref 4.0–10.5)

## 2014-06-07 NOTE — Progress Notes (Signed)
   Subjective:    Patient ID: Desiree Andrews, female    DOB: 06-07-1956, 58 y.o.   MRN: [id number]  HPI  For several months she's had intermittent head congestion, sore throat, & nonproductive cough.  She does have seasonal rhinoconjunctivitis.  She also has a history of rash each Summer  She is not having active signs of rhinosinusitis.  She is seeing a Periodontist because of gum bleeding. There apparently is also question of possible lichen planus intraorally. She also describes excessive watering of her mouth intermittently over the last several weeks.      Review of Systems Frontal headache, facial pain , nasal purulence, dental pain, sore throat , otic pain or otic discharge denied. No fever , chills or sweats.  She has had some wheezing intermittently.     Objective:   Physical Exam  Other than hyponasal speech pattern; exam is negative.  General appearance:good health ;well nourished; no acute distress or increased work of breathing is present.  No  lymphadenopathy about the head, neck, or axilla noted.   Eyes: No conjunctival inflammation or lid edema is present. There is no scleral icterus.  Ears:  External ear exam shows no significant lesions or deformities.  Otoscopic examination reveals clear canals, tympanic membranes are intact bilaterally without bulging, retraction, inflammation or discharge.  Nose:  External nasal examination shows no deformity or inflammation. Nasal mucosa are pink and moist without lesions or exudates. No septal dislocation or deviation.No obstruction to airflow.   Oral exam: Dental hygiene is good; lips and gums are healthy appearing.There is no oropharyngeal erythema or exudate noted.   Neck:  No deformities, thyromegaly, masses, or tenderness noted.   Supple with full range of motion without pain.   Heart:  Normal rate and regular rhythm. S1 and S2 normal without gallop, murmur, click, rub or other extra sounds.   Lungs:Chest clear  to auscultation; no wheezes, rhonchi,rales ,or rubs present.No increased work of breathing.    Extremities:  No cyanosis, edema, or clubbing  noted    Skin: Warm & dry w/o jaundice or tenting.         Assessment & Plan:  #1 allergic rhinitis #2 periodontal disease #3 ? Lichen planus See orders & AVS

## 2014-06-07 NOTE — Progress Notes (Signed)
Pre visit review using our clinic review tool, if applicable. No additional management support is needed unless otherwise documented below in the visit note. 

## 2014-06-07 NOTE — Patient Instructions (Signed)
Plain Mucinex (NOT D) for thick secretions ;force NON dairy fluids .   Nasal cleansing in the shower as discussed with lather of mild shampoo.After 10 seconds wash off lather while  exhaling through nostrils. Make sure that all residual soap is removed to prevent irritation.  Flonase OR Nasacort AQ 1 spray in each nostril twice a day as needed. Use the "crossover" technique into opposite nostril spraying toward opposite ear @ 45 degree angle, not straight up into nostril.  Use a Neti pot daily only  as needed for significant sinus congestion; going from open side to congested side . Plain Allegra (NOT D )  160 daily , Loratidine 10 mg , OR Zyrtec 10 mg @ bedtime  as needed for itchy eyes & sneezing. Please use a water pik to wash out the retained  Material between the teeth & in the tonsil.

## 2014-07-03 ENCOUNTER — Other Ambulatory Visit: Payer: Self-pay

## 2014-07-04 MED ORDER — CLONAZEPAM 0.5 MG PO TABS: ORAL_TABLET | ORAL | Status: DC

## 2014-07-04 NOTE — Telephone Encounter (Signed)
Clonazepam had been called to [facility] on [address]

## 2014-07-04 NOTE — Telephone Encounter (Signed)
OK X 3 mos 

## 2014-08-20 ENCOUNTER — Encounter: Payer: Self-pay | Admitting: Internal Medicine

## 2014-08-20 ENCOUNTER — Ambulatory Visit (INDEPENDENT_AMBULATORY_CARE_PROVIDER_SITE_OTHER): Payer: BC Managed Care – PPO | Admitting: Internal Medicine

## 2014-08-20 VITALS — BP 144/88 | HR 96 | Temp 98.0°F | Resp 14 | Ht 63.0 in | Wt 166.0 lb

## 2014-08-20 DIAGNOSIS — J069 Acute upper respiratory infection, unspecified: Secondary | ICD-10-CM

## 2014-08-20 DIAGNOSIS — J209 Acute bronchitis, unspecified: Secondary | ICD-10-CM

## 2014-08-20 MED ORDER — AMOXICILLIN 500 MG PO CAPS: 500.0000 mg | ORAL_CAPSULE | Freq: Three times a day (TID) | ORAL | Status: DC

## 2014-08-20 MED ORDER — HYDROCODONE-HOMATROPINE 5-1.5 MG/5ML PO SYRP: 5.0000 mL | ORAL_SOLUTION | Freq: Four times a day (QID) | ORAL | Status: DC | PRN

## 2014-08-20 NOTE — Patient Instructions (Signed)
Carry room temperature water and sip liberally after coughing.  Plain Mucinex (NOT D) for thick secretions ;force NON dairy fluids .   Nasal cleansing in the shower as discussed with lather of mild shampoo.After 10 seconds wash off lather while  exhaling through nostrils. Make sure that all residual soap is removed to prevent irritation.  Flonase OR Nasacort AQ 1 spray in each nostril twice a day as needed. Use the "crossover" technique into opposite nostril spraying toward opposite ear @ 45 degree angle, not straight up into nostril.  Use a Neti pot daily only  as needed for significant sinus congestion; going from open side to congested side . Plain Allegra (NOT D )  160 daily , Loratidine 10 mg , OR Zyrtec 10 mg @ bedtime  as needed for itchy eyes & sneezing.

## 2014-08-20 NOTE — Progress Notes (Signed)
   Subjective:    Patient ID: Desiree Andrews, female    DOB: October 03, 1955, 58 y.o.   MRN: [id number]  HPI  She describes a cough for 4 weeks. It has waxed and waned to some extent but remains a significant issue.  Initially symptoms were hoarseness and laryngitis which resolved with over-the-counter medications  She is now producing yellow-green secretions from both the head and chest; the volume is greater from the chest  Other than the nasal purulence she has no upper respiratory tract symptoms  She also has no significant extrinsic symptoms other than minor sneezing and some wheezing.  No smoking for > 20 years.  Review of Systems Frontal headache, facial pain , dental pain, sore throat , otic pain or otic discharge denied @ this time. No fever , chills or sweats.  Extrinsic symptoms of itchy, watery eyes,  or angioedema are denied. There is no  paroxysmal nocturnal dyspnea. No GERD symptoms of significance. Not on ACE-I.     Objective:   Physical Exam  General appearance:good health ;well nourished; no acute distress or increased work of breathing is present.  No  lymphadenopathy about the head, neck, or axilla noted.   Eyes: No conjunctival inflammation or lid edema is present. There is no scleral icterus.  Ears:  External ear exam shows no significant lesions or deformities.  Otoscopic examination reveals clear canals, tympanic membranes are intact bilaterally without bulging, retraction, inflammation or discharge.  Nose:  External nasal examination shows no deformity or inflammation. Nasal mucosa are pink and moist without lesions or exudates. No septal dislocation or deviation.No obstruction to airflow.   Oral exam: Dental hygiene is good; lips and gums are healthy appearing.There is no oropharyngeal erythema or exudate noted.   Neck:  No deformities, thyromegaly, masses, or tenderness noted.   Supple with full range of motion without pain.   Heart:  Normal rate and  regular rhythm. S1 and S2 normal without gallop, murmur, click, rub or other extra sounds.   Lungs:Chest clear to auscultation; no wheezes, rhonchi,rales ,or rubs present.No increased work of breathing.    Extremities:  No cyanosis, edema, or clubbing  noted    Skin: Warm & dry w/o jaundice or tenting.       Assessment & Plan:  #1 acute bronchitis w/o bronchospasm #2 URI, acute Plan: See orders and recommendations

## 2014-08-20 NOTE — Progress Notes (Signed)
Pre visit review using our clinic review tool, if applicable. No additional management support is needed unless otherwise documented below in the visit note. 

## 2014-08-28 ENCOUNTER — Encounter: Payer: Self-pay | Admitting: Internal Medicine

## 2014-08-28 ENCOUNTER — Ambulatory Visit (INDEPENDENT_AMBULATORY_CARE_PROVIDER_SITE_OTHER): Payer: 59 | Admitting: Internal Medicine

## 2014-08-28 VITALS — BP 156/100 | HR 85 | Temp 98.0°F | Ht 63.0 in | Wt 170.0 lb

## 2014-08-28 DIAGNOSIS — J310 Chronic rhinitis: Secondary | ICD-10-CM

## 2014-08-28 DIAGNOSIS — R059 Cough, unspecified: Secondary | ICD-10-CM

## 2014-08-28 DIAGNOSIS — R05 Cough: Secondary | ICD-10-CM

## 2014-08-28 MED ORDER — PREDNISONE 20 MG PO TABS: 20.0000 mg | ORAL_TABLET | Freq: Two times a day (BID) | ORAL | Status: DC

## 2014-08-28 MED ORDER — AZITHROMYCIN 250 MG PO TABS: ORAL_TABLET | ORAL | Status: DC

## 2014-08-28 MED ORDER — BENZONATATE 200 MG PO CAPS: 200.0000 mg | ORAL_CAPSULE | Freq: Three times a day (TID) | ORAL | Status: DC | PRN

## 2014-08-28 NOTE — Progress Notes (Signed)
Pre visit review using our clinic review tool, if applicable. No additional management support is needed unless otherwise documented below in the visit note. 

## 2014-08-28 NOTE — Progress Notes (Signed)
   Subjective:    Patient ID: Desiree Andrews, female    DOB: 1956/02/16, 59 y.o.   MRN: [id number]  HPI  She has almost completed the amoxicillin and continues to have the cough. She did not fill the narcotic cough syrup; she was concerned it might cause nausea.  She has started using an Alka-Seltzer over-the-counter product as well as Robitussin-DM without significant response  She has some postnasal drainage and sneezing. The cough remains nonproductive. She questions whether she might have some wheezing  She has no other upper respiratory tract infection symptoms.  Review of Systems Frontal headache, facial pain , nasal purulence, dental pain, sore throat , otic pain or otic discharge denied. No fever , chills or sweats.    Objective:   Physical Exam  General appearance:good health ;well nourished; no acute distress or increased work of breathing is present.  No  lymphadenopathy about the head, neck, or axilla noted.   Eyes: No conjunctival inflammation or lid edema is present. There is no scleral icterus.  Ears:  External ear exam shows no significant lesions or deformities.  Otoscopic examination reveals clear canals, tympanic membranes are intact bilaterally without bulging, retraction, inflammation or discharge.  Nose:  External nasal examination shows no deformity or inflammation. Nasal mucosa are pink and moist without lesions or exudates. No septal dislocation or deviation.No obstruction to airflow.   Oral exam: Dental hygiene is good; lips and gums are healthy appearing.There is no oropharyngeal erythema or exudate noted.   Neck:  No deformities, thyromegaly, masses, or tenderness noted.   Supple with full range of motion without pain.   Heart:  Normal rate and regular rhythm. S1 and S2 normal without gallop, murmur, click, rub or other extra sounds.   Lungs:Chest clear to auscultation; no wheezes, rhonchi,rales ,or rubs present.No increased work of breathing.     Extremities:  No cyanosis, edema, or clubbing  noted    Skin: Warm & dry w/o jaundice or tenting.       Assessment & Plan:  #1 cough #2 non allergic rhinitis See orders & AVS

## 2014-08-28 NOTE — Patient Instructions (Signed)
Plain Mucinex (NOT D) for thick secretions ;force NON dairy fluids .   Nasal cleansing in the shower as discussed with lather of mild shampoo.After 10 seconds wash off lather while  exhaling through nostrils. Make sure that all residual soap is removed to prevent irritation.  Flonase OR Nasacort AQ 1 spray in each nostril twice a day as needed. Use the "crossover" technique into opposite nostril spraying toward opposite ear @ 45 degree angle, not straight up into nostril.  Plain Allegra (NOT D )  160 daily , Loratidine 10 mg , OR Zyrtec 10 mg @ bedtime  as needed for itchy eyes & sneezing.  Carry room temperature water and sip liberally after coughing.

## 2014-10-17 ENCOUNTER — Other Ambulatory Visit: Payer: Self-pay | Admitting: Internal Medicine

## 2014-10-17 ENCOUNTER — Telehealth: Payer: Self-pay | Admitting: Internal Medicine

## 2014-10-17 DIAGNOSIS — J209 Acute bronchitis, unspecified: Secondary | ICD-10-CM

## 2014-10-17 MED ORDER — HYDROCODONE-HOMATROPINE 5-1.5 MG/5ML PO SYRP: 5.0000 mL | ORAL_SOLUTION | Freq: Four times a day (QID) | ORAL | Status: DC | PRN

## 2014-10-17 NOTE — Telephone Encounter (Signed)
Left message for patient to return call.   Script for cough syrup is at the front desk

## 2014-10-17 NOTE — Telephone Encounter (Signed)
Patient states she has a cough and has been on several antibiotics.  She would like another z pak sent in [facility] on [address] and something for cough.

## 2014-10-17 NOTE — Telephone Encounter (Signed)
Rx for cough syrup OK.  Plain Mucinex (NOT D) for thick secretions ;force NON dairy fluids .   Nasal cleansing in the shower  with lather of mild shampoo.After 10 seconds wash off lather while  exhaling through nostrils. Make sure that all residual soap is removed to prevent irritation.  Flonase OR Nasacort AQ 1 spray in each nostril twice a day as needed. Use the "crossover" technique into opposite nostril spraying toward opposite ear @ 45 degree angle, not straight up into nostril.  Plain Allegra (NOT D )  160 daily , Loratidine 10 mg , OR Zyrtec 10 mg @ bedtime  as needed for itchy eyes & sneezing. The standard of care is that antibiotics are not prescribed without an office visit.This policy is to help prevent serious health threatening infections with antibiotic resistant organisms or complications such as antibiotic-induced colitis.

## 2014-10-17 NOTE — Telephone Encounter (Signed)
Patient called in,. Advised of below note.   She is goingto come and pick up RX and a copy of dr hoppers notes below.

## 2014-10-18 ENCOUNTER — Other Ambulatory Visit: Payer: Self-pay | Admitting: Internal Medicine

## 2014-10-18 DIAGNOSIS — Z1211 Encounter for screening for malignant neoplasm of colon: Secondary | ICD-10-CM

## 2014-10-18 NOTE — Telephone Encounter (Signed)
Patient came in and picked up her Rx and instructions.   She would like to know if you will put in a referral for her to have a colonoscopy done.

## 2014-10-18 NOTE — Telephone Encounter (Signed)
done

## 2014-10-22 ENCOUNTER — Encounter: Payer: Self-pay | Admitting: Internal Medicine

## 2014-10-31 ENCOUNTER — Other Ambulatory Visit: Payer: Self-pay | Admitting: Internal Medicine

## 2014-12-19 ENCOUNTER — Other Ambulatory Visit: Payer: Self-pay

## 2014-12-19 DIAGNOSIS — Z1231 Encounter for screening mammogram for malignant neoplasm of breast: Secondary | ICD-10-CM

## 2014-12-24 ENCOUNTER — Encounter: Payer: Self-pay | Admitting: Cardiology

## 2015-01-01 ENCOUNTER — Ambulatory Visit: Payer: 59

## 2015-01-02 ENCOUNTER — Ambulatory Visit (AMBULATORY_SURGERY_CENTER): Payer: Self-pay | Admitting: *Deleted

## 2015-01-02 VITALS — Ht 63.0 in | Wt 168.0 lb

## 2015-01-02 DIAGNOSIS — Z1211 Encounter for screening for malignant neoplasm of colon: Secondary | ICD-10-CM

## 2015-01-02 MED ORDER — MOVIPREP 100 G PO SOLR: 1.0000 | Freq: Once | ORAL | Status: DC

## 2015-01-02 NOTE — Progress Notes (Signed)
No home 02 use No egg or soy allergy No issues with sedation No diet pills emmi video to e mail Pt states she has a thinning of vaginal tissues per her OB/GYN. She has occ hard stool with occ bleeding and her GYN said was from the thinning tissue. She will have the ob/gyn office fax these records to [place].

## 2015-01-08 ENCOUNTER — Telehealth: Payer: Self-pay | Admitting: Internal Medicine

## 2015-01-08 NOTE — Telephone Encounter (Signed)
Spoke with pt.She states the Moviprep will cost her over $90.00 with her insurance and she can not afford it. Informed pt we can give her a new prep called Miralax which is cheaper and over the counter.I will print new instructions for the Miralax bowel prep and the pt will come by the office today to pick up the instructions.She will call our office if she has any further questions.

## 2015-01-16 ENCOUNTER — Encounter: Payer: Self-pay | Admitting: Internal Medicine

## 2015-01-16 ENCOUNTER — Ambulatory Visit (AMBULATORY_SURGERY_CENTER): Payer: 59 | Admitting: Internal Medicine

## 2015-01-16 VITALS — BP 167/74 | HR 59 | Temp 97.5°F | Resp 16 | Ht 63.0 in | Wt 168.0 lb

## 2015-01-16 DIAGNOSIS — D125 Benign neoplasm of sigmoid colon: Secondary | ICD-10-CM

## 2015-01-16 DIAGNOSIS — D124 Benign neoplasm of descending colon: Secondary | ICD-10-CM

## 2015-01-16 DIAGNOSIS — Z1211 Encounter for screening for malignant neoplasm of colon: Secondary | ICD-10-CM | POA: Diagnosis present

## 2015-01-16 DIAGNOSIS — D122 Benign neoplasm of ascending colon: Secondary | ICD-10-CM

## 2015-01-16 MED ORDER — SODIUM CHLORIDE 0.9 % IV SOLN: 500.0000 mL | INTRAVENOUS | Status: DC

## 2015-01-16 NOTE — Progress Notes (Signed)
Called to room to assist during endoscopic procedure.  Patient ID and intended procedure confirmed with present staff. Received instructions for my participation in the procedure from the performing physician.  

## 2015-01-16 NOTE — Patient Instructions (Signed)
YOU HAD AN ENDOSCOPIC PROCEDURE TODAY AT [facility]:   Refer to the procedure report that was given to you for any specific questions about what was found during the examination.  If the procedure report does not answer your questions, please call your gastroenterologist to clarify.  If you requested that your care partner not be given the details of your procedure findings, then the procedure report has been included in a sealed envelope for you to review at your convenience later.  YOU SHOULD EXPECT: Some feelings of bloating in the abdomen. Passage of more gas than usual.  Walking can help get rid of the air that was put into your GI tract during the procedure and reduce the bloating. If you had a lower endoscopy (such as a colonoscopy or flexible sigmoidoscopy) you may notice spotting of blood in your stool or on the toilet paper. If you underwent a bowel prep for your procedure, you may not have a normal bowel movement for a few days.  Please Note:  You might notice some irritation and congestion in your nose or some drainage.  This is from the oxygen used during your procedure.  There is no need for concern and it should clear up in a day or so.  SYMPTOMS TO REPORT IMMEDIATELY:   Following lower endoscopy (colonoscopy or flexible sigmoidoscopy):  Excessive amounts of blood in the stool  Significant tenderness or worsening of abdominal pains  Swelling of the abdomen that is new, acute  Fever of 100F or higher  For urgent or emergent issues, a gastroenterologist can be reached at any hour by calling [phone].  DIET: Your first meal following the procedure should be a small meal and then it is ok to progress to your normal diet. Heavy or fried foods are harder to digest and may make you feel nauseous or bloated.  Likewise, meals heavy in dairy and vegetables can increase bloating.  Drink plenty of fluids but you should avoid alcoholic beverages for 24 hours.  ACTIVITY:   You should plan to take it easy for the rest of today and you should NOT DRIVE or use heavy machinery until tomorrow (because of the sedation medicines used during the test).    FOLLOW UP: Our staff will call the number listed on your records the next business day following your procedure to check on you and address any questions or concerns that you may have regarding the information given to you following your procedure. If we do not reach you, we will leave a message.  However, if you are feeling well and you are not experiencing any problems, there is no need to return our call.  We will assume that you have returned to your regular daily activities without incident.  If any biopsies were taken you will be contacted by phone or by letter within the next 1-3 weeks.  Please call us at [phone] if you have not heard about the biopsies in 3 weeks.   SIGNATURES/CONFIDENTIALITY: You and/or your care partner have signed paperwork which will be entered into your electronic medical record.  These signatures attest to the fact that that the information above on your After Visit Summary has been reviewed and is understood.  Full responsibility of the confidentiality of this discharge information lies with you and/or your care-partner.  Await pathology  Please read over handouts about polyps, diverticulosis and high fiber diets  No Aspirin, Aspirin containing products (BC or Goody powders) or NSAIDS (Ibuprofen,  Advil, Aleve, Motrin) for 2 weeks- Tylenol is ok

## 2015-01-16 NOTE — Progress Notes (Signed)
Pt to PACU. Awake and Alert. Pleased with MAC. Report to RN

## 2015-01-16 NOTE — Op Note (Signed)
[facility]  [address] [place], [id number]   COLONOSCOPY PROCEDURE REPORT  PATIENT: Desiree Andrews, Desiree Andrews  MR#: [id number] BIRTHDATE: 06/14/1956 , 58  yrs. old GENDER: female ENDOSCOPIST: Lafayette Dragon, MD REFERRED XB:[id number] Linna Darner, M.D. PROCEDURE DATE:  01/16/2015 PROCEDURE:   Colonoscopy, screening, Colonoscopy with cold biopsy polypectomy, and Colonoscopy with snare polypectomy First Screening Colonoscopy - Avg.  risk and is 50 yrs.  old or older - No.  Prior Negative Screening - Now for repeat screening. 10 or more years since last screening  History of Adenoma - Now for follow-up colonoscopy & has been > or = to 3 yrs.  N/A  Polyps removed today? Yes ASA CLASS:   Class I INDICATIONS:Screening for colonic neoplasia, Colorectal Neoplasm Risk Assessment for this procedure is average risk, and prior colonoscopy in September 2006 was normal. MEDICATIONS: Monitored anesthesia care and Propofol 400 mg IV  DESCRIPTION OF PROCEDURE:   After the risks benefits and alternatives of the procedure were thoroughly explained, informed consent was obtained.  The digital rectal exam revealed no abnormalities of the rectum.   The LB [id number]-[id number]  endoscope was introduced through the anus and advanced to the cecum, which was identified by both the appendix and ileocecal valve. No adverse events experienced.   The quality of the prep was good.  (MoviPrep was used)  The instrument was then slowly withdrawn as the colon was fully examined. Estimated blood loss is zero unless otherwise noted in this procedure report.      COLON FINDINGS: Two sessile polyps 4 and 5 mm  and one pedunculated polyp 15 mm  were found in the sigmoid colon ( pedunculated) and ascending colon.( sessile)  A polypectomy was performed with cold forceps.in ascending colon  The resection was complete, the polyp tissue was completely retrieved and sent to histology.  A polypectomy was performed using  snare cautery.in sigmoid polyp - 15 mm in size The resection was complete, the polyp tissue was completely retrieved and sent to histology.   There was mild diverticulosis noted in the sigmoid colon.  Retroflexed views revealed no abnormalities. The time to cecum = 8.40 Withdrawal time = 13.54   The scope was withdrawn and the procedure completed.  COMPLICATIONS: There were no immediate complications.  ENDOSCOPIC IMPRESSION: 1. 2 ascending colon polyps all 4 and 5 mm removed with the cold biopsies 2.  pedunculated 15 mm sigmoid colon polyp removed with  hot snare 3. mild diverticulosis of the sigmoid colon  RECOMMENDATIONS: 1.  Await pathology results 2.  No aspirin or) inflammatory agents for 2 weeks High-fiber diet recall colonoscopy pending path report  eSigned:  Lafayette Dragon, MD 01/16/2015 9:14 AM   cc:   PATIENT NAME:  Desiree Andrews, Desiree Andrews MR#: [id number]

## 2015-01-17 ENCOUNTER — Telehealth: Payer: Self-pay | Admitting: *Deleted

## 2015-01-17 NOTE — Telephone Encounter (Signed)
  Follow up Call-  Call back number 01/16/2015  Post procedure Call Back phone  # [phone]  Permission to leave phone message Yes     No answer at # given.  Left message on voicemail.

## 2015-01-22 ENCOUNTER — Encounter: Payer: Self-pay | Admitting: Internal Medicine

## 2015-01-23 ENCOUNTER — Ambulatory Visit: Payer: 59

## 2015-02-11 ENCOUNTER — Ambulatory Visit: Payer: 59

## 2015-03-04 ENCOUNTER — Other Ambulatory Visit: Payer: Self-pay | Admitting: Internal Medicine

## 2015-03-04 NOTE — Telephone Encounter (Signed)
OK X1  My retirement date is 08/24/2015; but I will be in office on a limited schedule Oct-Dec. To guarantee continuity of care you should transition your care to another PCP by Oct 1,2016.     

## 2015-03-05 ENCOUNTER — Other Ambulatory Visit: Payer: Self-pay | Admitting: Emergency Medicine

## 2015-03-05 MED ORDER — CLONAZEPAM 0.5 MG PO TABS: ORAL_TABLET | ORAL | Status: DC

## 2015-05-02 ENCOUNTER — Other Ambulatory Visit: Payer: Self-pay | Admitting: Internal Medicine

## 2015-05-03 NOTE — Telephone Encounter (Signed)
Printed rx md sign has been faxed back to [facility]...Johny Chess

## 2015-05-03 NOTE — Telephone Encounter (Signed)
Please advise, thanks.

## 2015-05-03 NOTE — Telephone Encounter (Signed)
OK X1 

## 2015-05-31 ENCOUNTER — Ambulatory Visit: Payer: 59

## 2015-07-01 ENCOUNTER — Ambulatory Visit
Admission: RE | Admit: 2015-07-01 | Discharge: 2015-07-01 | Disposition: A | Discharge status: Home / Self Care | Payer: 59 | Source: Ambulatory Visit

## 2015-07-01 DIAGNOSIS — Z1231 Encounter for screening mammogram for malignant neoplasm of breast: Secondary | ICD-10-CM

## 2015-07-04 ENCOUNTER — Other Ambulatory Visit: Payer: Self-pay | Admitting: Internal Medicine

## 2015-07-04 DIAGNOSIS — R928 Other abnormal and inconclusive findings on diagnostic imaging of breast: Secondary | ICD-10-CM

## 2015-07-10 ENCOUNTER — Ambulatory Visit: Payer: 59

## 2015-07-10 ENCOUNTER — Other Ambulatory Visit: Payer: Self-pay | Admitting: Internal Medicine

## 2015-07-10 ENCOUNTER — Ambulatory Visit
Admission: RE | Admit: 2015-07-10 | Discharge: 2015-07-10 | Disposition: A | Discharge status: Home / Self Care | Payer: 59 | Source: Ambulatory Visit | Attending: Internal Medicine | Admitting: Internal Medicine

## 2015-07-10 DIAGNOSIS — R928 Other abnormal and inconclusive findings on diagnostic imaging of breast: Secondary | ICD-10-CM

## 2015-07-11 ENCOUNTER — Other Ambulatory Visit: Payer: Self-pay | Admitting: Internal Medicine

## 2015-07-11 DIAGNOSIS — R928 Other abnormal and inconclusive findings on diagnostic imaging of breast: Secondary | ICD-10-CM

## 2015-07-12 ENCOUNTER — Ambulatory Visit
Admission: RE | Admit: 2015-07-12 | Discharge: 2015-07-12 | Disposition: A | Discharge status: Home / Self Care | Payer: 59 | Source: Ambulatory Visit | Attending: Internal Medicine | Admitting: Internal Medicine

## 2015-07-12 DIAGNOSIS — R928 Other abnormal and inconclusive findings on diagnostic imaging of breast: Secondary | ICD-10-CM

## 2015-07-30 ENCOUNTER — Telehealth: Payer: Self-pay | Admitting: Emergency Medicine

## 2015-07-30 MED ORDER — CLONAZEPAM 0.5 MG PO TABS: ORAL_TABLET | ORAL | Status: DC

## 2015-07-30 NOTE — Telephone Encounter (Signed)
Last OV 09/07/14, Last refill 05/03/15. Refill request for Clonazepam. Please advise

## 2015-07-30 NOTE — Telephone Encounter (Signed)
RX has been phoned into [facility].

## 2015-07-30 NOTE — Telephone Encounter (Signed)
She is super person; just had breast cancer scare. Refill X 1 month ; but verify if will be seeing Dr Quay Burow which I wholeheartedly recommend. Thanks, [employer]

## 2015-07-31 ENCOUNTER — Telehealth: Payer: Self-pay | Admitting: Internal Medicine

## 2015-07-31 MED ORDER — METOPROLOL TARTRATE 25 MG PO TABS: 25.0000 mg | ORAL_TABLET | Freq: Two times a day (BID) | ORAL | Status: DC

## 2015-07-31 NOTE — Telephone Encounter (Signed)
Pt requesting refill for metoprolol tartrate (LOPRESSOR) 25 MG tablet [id number]  [place]

## 2015-07-31 NOTE — Telephone Encounter (Signed)
Sent a 30 day until appt on 12/16 w/Dr. Quay Burow...Desiree Andrews

## 2015-08-09 ENCOUNTER — Encounter: Payer: Self-pay | Admitting: Internal Medicine

## 2015-08-09 ENCOUNTER — Ambulatory Visit (INDEPENDENT_AMBULATORY_CARE_PROVIDER_SITE_OTHER): Payer: 59 | Admitting: Internal Medicine

## 2015-08-09 ENCOUNTER — Other Ambulatory Visit (INDEPENDENT_AMBULATORY_CARE_PROVIDER_SITE_OTHER): Payer: 59

## 2015-08-09 VITALS — BP 138/94 | HR 90 | Temp 97.7°F | Resp 16 | Ht 63.0 in | Wt 169.0 lb

## 2015-08-09 DIAGNOSIS — M8580 Other specified disorders of bone density and structure, unspecified site: Secondary | ICD-10-CM | POA: Diagnosis not present

## 2015-08-09 DIAGNOSIS — E782 Mixed hyperlipidemia: Secondary | ICD-10-CM | POA: Diagnosis not present

## 2015-08-09 DIAGNOSIS — Z0000 Encounter for general adult medical examination without abnormal findings: Secondary | ICD-10-CM

## 2015-08-09 DIAGNOSIS — G479 Sleep disorder, unspecified: Secondary | ICD-10-CM

## 2015-08-09 DIAGNOSIS — Z23 Encounter for immunization: Secondary | ICD-10-CM | POA: Diagnosis not present

## 2015-08-09 DIAGNOSIS — E559 Vitamin D deficiency, unspecified: Secondary | ICD-10-CM

## 2015-08-09 DIAGNOSIS — I10 Essential (primary) hypertension: Secondary | ICD-10-CM

## 2015-08-09 LAB — COMPREHENSIVE METABOLIC PANEL
ALT: 28 U/L (ref 0–35)
AST: 25 U/L (ref 0–37)
Albumin: 4.4 g/dL (ref 3.5–5.2)
Alkaline Phosphatase: 64 U/L (ref 39–117)
BUN: 13 mg/dL (ref 6–23)
CALCIUM: 10.6 mg/dL — AB (ref 8.4–10.5)
CHLORIDE: 102 meq/L (ref 96–112)
CO2: 28 meq/L (ref 19–32)
Creatinine, Ser: 0.76 mg/dL (ref 0.40–1.20)
GFR: 82.15 mL/min (ref >60.00)
GLUCOSE: 101 mg/dL — AB (ref 70–99)
POTASSIUM: 4.3 meq/L (ref 3.5–5.1)
Sodium: 140 mEq/L (ref 135–145)
Total Bilirubin: 0.5 mg/dL (ref 0.2–1.2)
Total Protein: 7.5 g/dL (ref 6.0–8.3)

## 2015-08-09 LAB — LIPID PANEL
CHOL/HDL RATIO: 6
Cholesterol: 259 mg/dL — ABNORMAL HIGH (ref 0–200)
HDL: 43.7 mg/dL (ref >39.00)
NONHDL: 214.94
TRIGLYCERIDES: 332 mg/dL — AB (ref 0.0–149.0)
VLDL: 66.4 mg/dL — AB (ref 0.0–40.0)

## 2015-08-09 LAB — CBC WITH DIFFERENTIAL/PLATELET
Basophils Absolute: 0.1 10*3/uL (ref 0.0–0.1)
Basophils Relative: 0.9 % (ref 0.0–3.0)
EOS PCT: 4.2 % (ref 0.0–5.0)
Eosinophils Absolute: 0.3 10*3/uL (ref 0.0–0.7)
HEMATOCRIT: 44.3 % (ref 36.0–46.0)
Hemoglobin: 15.1 g/dL — ABNORMAL HIGH (ref 12.0–15.0)
LYMPHS ABS: 3.1 10*3/uL (ref 0.7–4.0)
LYMPHS PCT: 40.7 % (ref 12.0–46.0)
MCHC: 34.2 g/dL (ref 30.0–36.0)
MCV: 94.7 fl (ref 78.0–100.0)
MONOS PCT: 8.3 % (ref 3.0–12.0)
Monocytes Absolute: 0.6 10*3/uL (ref 0.1–1.0)
NEUTROS ABS: 3.5 10*3/uL (ref 1.4–7.7)
NEUTROS PCT: 45.9 % (ref 43.0–77.0)
PLATELETS: 281 10*3/uL (ref 150.0–400.0)
RBC: 4.68 Mil/uL (ref 3.87–5.11)
RDW: 13 % (ref 11.5–15.5)
WBC: 7.6 10*3/uL (ref 4.0–10.5)

## 2015-08-09 LAB — TSH: TSH: 0.64 u[IU]/mL (ref 0.35–4.50)

## 2015-08-09 LAB — LDL CHOLESTEROL, DIRECT: Direct LDL: 144 mg/dL

## 2015-08-09 LAB — HEMOGLOBIN A1C: Hgb A1c MFr Bld: 5.9 % (ref 4.6–6.5)

## 2015-08-09 MED ORDER — CHOLECALCIFEROL 25 MCG (1000 UT) PO TABS: 1000.0000 [IU] | ORAL_TABLET | Freq: Every day | ORAL | Status: DC

## 2015-08-09 NOTE — Progress Notes (Signed)
Subjective:    Patient ID: Desiree Andrews, female    DOB: 1956/08/21, 59 y.o.   MRN: [id number]  HPI She is here to establish with a new pcp.   She thinks she may have IBS.  She has had some fecal incontince with urgency - the stool is loose when this occurs.  It does not happen that often, but often enough that it is very bothersome.  She is unsure if it is realted to a certain food or eating in general.  She typically has a bowel movement every other day on average and it is solid.  Her colonoscopy is up to date.    She takes clonazepam only when she needs it to sleep.  If she has not slept in a couple of days she will take it.   Hypertension: She is taking her medication daily. She is compliant with a low sodium diet.  She denies chest pain, palpitations, edema, shortness of breath and regular headaches. She is not exercising regularly.  She does not monitor her blood pressure at home.     Medications and allergies reviewed with patient and updated if appropriate.  Patient Active Problem List   Diagnosis Date Noted  . Other abnormal glucose 09/13/2013  . 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 History of breast cancer 09/19/2012  . Weight gain 11/27/2010  . ANXIETY 08/06/2010  . GERD 08/06/2010  . SLEEP DISORDER 08/06/2010  . FATIGUE 08/06/2010  . HEADACHE 10/15/2009  . NONSPEC ELEVATION OF LEVELS OF TRANSAMINASE/LDH 10/01/2009  . THYROID FUNCTION TEST, ABNORMAL 10/01/2009  . PARESTHESIA, HANDS 05/20/2009  . VITAMIN D DEFICIENCY 12/18/2008  . OSTEOPENIA 12/18/2008  . BREAST CANCER, HX OF 12/18/2008  . HYPERLIPIDEMIA 10/19/2007  . PALPITATIONS 10/19/2007  . HEPATITIS C, HX OF 10/19/2007  . HYPERTENSION, ESSENTIAL NOS 06/07/2007    Current Outpatient Prescriptions on File Prior to Visit  Medication Sig Dispense Refill  . cetirizine (ZYRTEC) 10 MG tablet Take 10 mg by mouth as needed.    . clonazePAM (KLONOPIN) 0.5 MG tablet TAKE 1 TABLET BY MOUTH EVERY 8 TO  12 HOURS AS NEEDED 30 tablet 0  . metoprolol tartrate (LOPRESSOR) 25 MG tablet Take 1 tablet (25 mg total) by mouth 2 (two) times daily. 60 tablet 0   No current facility-administered medications on file prior to visit.    Past Medical History  Diagnosis Date  . Hyperlipidemia   . Hypertension   . Cardiopulmonary arrest 1991    Postpartum,? Pulmonary thromboembolism  . Anxiety   . Depression     off cymbalta now  . Allergy   . Cancer     breast-left     Past Surgical History  Procedure Laterality Date  . Cardiovascular stress test  2005    Neg  . Colonoscopy  2006    Normal  . Wrist surgery  2012    Left, Dr.Rowen  . Breast lumpectomy  2003    snbx-Left, Dr.Rubin; Radiation & Chemotherapy X3  . Breast lumpectomy with needle localization Right 11/30/2012    Procedure: BREAST LUMPECTOMY WITH NEEDLE LOCALIZATION;  Surgeon: Marcello Moores A. Cornett, MD;  Location: [facility];  Service: General;  Laterality: Right;  needle localization at [facility] 8:00     Social History   Social History  . Marital Status: Divorced    Spouse Name: N/A  . Number of Children: N/A  . Years of Education: N/A   Social History Main Topics  .  Smoking status: Former Smoker    Quit date: 08/24/1990  . Smokeless tobacco: Never Used     Comment: smoked [place], up to < 1 ppd  . Alcohol Use: 8.4 oz/week    14 Glasses of wine per week     Comment: Socially  . Drug Use: No  . Sexual Activity: Not on file   Other Topics Concern  . Not on file   Social History Narrative    Review of Systems  Constitutional: Negative for fever, chills and fatigue.  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   Eyes: Negative for visual disturbance.  Respiratory: Negative for cough, shortness of breath and wheezing.   Cardiovascular: Negative for chest pain, palpitations and leg swelling.  Gastrointestinal: Negative for nausea, abdominal pain and blood in stool.       Rare  GERD  Genitourinary: Negative for dysuria and hematuria.  Musculoskeletal: Negative for myalgias, back pain and arthralgias.  Neurological: Negative for dizziness, weakness, light-headedness, numbness and headaches.  Psychiatric/Behavioral: Positive for sleep disturbance (occasional). Negative for dysphoric mood. The patient is not nervous/anxious.        Objective:   Filed Vitals:   08/09/15 0846  BP: 138/94  Pulse: 90  Temp: 97.7 F (36.5 C)  Resp: 16   Filed Weights   08/09/15 0846  Weight: 169 lb (76.658 kg)   Body mass index is 29.94 kg/(m^2).   Physical Exam Constitutional: She appears well-developed and well-nourished. No distress.  HENT:  Head: Normocephalic and atraumatic.  Right Ear: External ear normal.  Left Ear: External ear normal.  Mouth/Throat: Oropharynx is clear and moist.  Normal bilateral ear canals and tympanic membranes  Eyes: Conjunctivae and EOM are normal.  Neck: Neck supple. No tracheal deviation present. No thyromegaly present.  No carotid bruit  Cardiovascular: Normal rate, regular rhythm and normal heart sounds.   No murmur heard. Pulmonary/Chest: Effort normal and breath sounds normal. No respiratory distress. She has no wheezes. She has no rales.  Abdominal: Soft. She exhibits no distension. There is no tenderness.  Musculoskeletal: She exhibits no edema.  Lymphadenopathy:    She has no cervical adenopathy.  Skin: Skin is warm and dry. She is not diaphoretic.  Psychiatric: She has a normal mood and affect. Her behavior is normal.         Assessment & Plan:   Physical exam: Screening blood work ordered Immunizations, - flu shot today, discussed shingles vaccine-advised her to come back next year for nurse visit to get the vaccine Colonoscopy up to date dexa up-to-date Gyn up to date 57 graom up to date Has eye appt scheduled Sees derm annually No EKG needed today Stressed regular exercise Stressed decreased portions for  weight loss-encouraged weight loss  See Problem List for review of chronic medical problems

## 2015-08-09 NOTE — Progress Notes (Signed)
Pre visit review using our clinic review tool, if applicable. No additional management support is needed unless otherwise documented below in the visit note. 

## 2015-08-09 NOTE — Assessment & Plan Note (Signed)
BP Readings from Last 3 Encounters:  08/09/15 138/94  01/16/15 167/74  08/28/14 156/100   Blood pressures have been elevated this year and I'm concerned that her blood pressure is not well-controlled She states she has gained weight and is not currently exercising Advised her to start monitoring her blood pressure regularly and call her blood pressure is not controlled-we will need to increase her medication to 50 mg twice daily Stressed the importance of low sodium diet, regular exercise and weight loss Discussed potential consequences of uncontrolled hypertension

## 2015-08-09 NOTE — Assessment & Plan Note (Signed)
Will check vitamin D level, especially given osteopenia and history of breast cancer

## 2015-08-09 NOTE — Assessment & Plan Note (Signed)
DEXA up-to-date Taking calcium Stressed regular exercise

## 2015-08-09 NOTE — Assessment & Plan Note (Signed)
Takes clonazepam only as needed-likely related to anxiety Encouraged her not to take regularly

## 2015-08-09 NOTE — Patient Instructions (Addendum)
  We have reviewed your prior records including labs and tests today.  Test(s) ordered today. Your results will be released to [facility] (or called to you) after review, usually within 72hours after test completion. If any changes need to be made, you will be notified at that same time.  All other Health Maintenance issues reviewed.   All recommended immunizations and age-appropriate screenings are up-to-date.  Shingles vaccine discussed  - consider getting that next year.    Flu vaccine administered today.   Medications reviewed and updated. No changes recommended at this time.   Start monitoring your BP regularly.  Work on increasing your exercise and losing weight.     Please followup annual

## 2015-08-13 ENCOUNTER — Other Ambulatory Visit: Payer: Self-pay | Admitting: Internal Medicine

## 2015-08-15 LAB — VITAMIN D 1,25 DIHYDROXY
Vitamin D 1, 25 (OH)2 Total: 40 pg/mL (ref 18–72)
Vitamin D2 1, 25 (OH)2: <8 pg/mL
Vitamin D3 1, 25 (OH)2: 40 pg/mL

## 2015-08-21 NOTE — Telephone Encounter (Signed)
Refill was done by Dr. Quay Burow @ [facility] 08/09/15...Johny Chess

## 2015-08-22 ENCOUNTER — Encounter: Payer: Self-pay | Admitting: Internal Medicine

## 2015-08-29 ENCOUNTER — Other Ambulatory Visit: Payer: Self-pay | Admitting: Internal Medicine

## 2015-09-12 ENCOUNTER — Telehealth: Payer: Self-pay | Admitting: Emergency Medicine

## 2015-09-12 MED ORDER — CLONAZEPAM 0.5 MG PO TABS: ORAL_TABLET | ORAL | Status: DC

## 2015-09-12 NOTE — Telephone Encounter (Signed)
Rx phoned in to pharm

## 2015-10-30 ENCOUNTER — Other Ambulatory Visit: Payer: Self-pay | Admitting: Internal Medicine

## 2015-11-01 ENCOUNTER — Other Ambulatory Visit: Payer: Self-pay | Admitting: Internal Medicine

## 2015-11-01 NOTE — Telephone Encounter (Signed)
[facility]axed rx to [facility] 

## 2015-12-17 DIAGNOSIS — J209 Acute bronchitis, unspecified: Secondary | ICD-10-CM | POA: Diagnosis not present

## 2015-12-18 DIAGNOSIS — F900 Attention-deficit hyperactivity disorder, predominantly inattentive type: Secondary | ICD-10-CM | POA: Diagnosis not present

## 2015-12-26 ENCOUNTER — Other Ambulatory Visit: Payer: Self-pay | Admitting: Internal Medicine

## 2015-12-26 NOTE — Telephone Encounter (Signed)
RX faxed to [facility] 

## 2016-02-03 ENCOUNTER — Other Ambulatory Visit: Payer: Self-pay | Admitting: Internal Medicine

## 2016-02-03 NOTE — Telephone Encounter (Signed)
RX faxed to [facility] 

## 2016-02-06 DIAGNOSIS — Z23 Encounter for immunization: Secondary | ICD-10-CM | POA: Diagnosis not present

## 2016-03-30 ENCOUNTER — Other Ambulatory Visit: Payer: Self-pay | Admitting: Internal Medicine

## 2016-04-07 DIAGNOSIS — D485 Neoplasm of uncertain behavior of skin: Secondary | ICD-10-CM | POA: Diagnosis not present

## 2016-06-09 DIAGNOSIS — F900 Attention-deficit hyperactivity disorder, predominantly inattentive type: Secondary | ICD-10-CM | POA: Diagnosis not present

## 2016-07-09 ENCOUNTER — Other Ambulatory Visit: Payer: Self-pay | Admitting: Internal Medicine

## 2016-07-16 ENCOUNTER — Other Ambulatory Visit: Payer: Self-pay | Admitting: Internal Medicine

## 2016-07-21 ENCOUNTER — Other Ambulatory Visit: Payer: Self-pay | Admitting: Emergency Medicine

## 2016-07-21 MED ORDER — METOPROLOL TARTRATE 25 MG PO TABS: ORAL_TABLET | ORAL | 0 refills | Status: DC

## 2016-07-23 ENCOUNTER — Other Ambulatory Visit: Payer: Self-pay | Admitting: Internal Medicine

## 2016-07-23 DIAGNOSIS — Z1231 Encounter for screening mammogram for malignant neoplasm of breast: Secondary | ICD-10-CM

## 2016-07-29 DIAGNOSIS — Z23 Encounter for immunization: Secondary | ICD-10-CM | POA: Diagnosis not present

## 2016-07-30 ENCOUNTER — Ambulatory Visit: Payer: Self-pay

## 2016-08-25 ENCOUNTER — Ambulatory Visit
Admission: RE | Admit: 2016-08-25 | Discharge: 2016-08-25 | Disposition: A | Discharge status: Home / Self Care | Payer: Self-pay | Source: Ambulatory Visit | Attending: Internal Medicine | Admitting: Internal Medicine

## 2016-08-25 DIAGNOSIS — Z1231 Encounter for screening mammogram for malignant neoplasm of breast: Secondary | ICD-10-CM | POA: Diagnosis not present

## 2016-08-27 ENCOUNTER — Other Ambulatory Visit: Payer: Self-pay | Admitting: Internal Medicine

## 2016-08-27 DIAGNOSIS — R928 Other abnormal and inconclusive findings on diagnostic imaging of breast: Secondary | ICD-10-CM

## 2016-08-28 ENCOUNTER — Other Ambulatory Visit: Payer: Self-pay | Admitting: Internal Medicine

## 2016-08-28 ENCOUNTER — Other Ambulatory Visit: Payer: Self-pay

## 2016-08-28 DIAGNOSIS — R928 Other abnormal and inconclusive findings on diagnostic imaging of breast: Secondary | ICD-10-CM

## 2016-08-31 ENCOUNTER — Ambulatory Visit
Admission: RE | Admit: 2016-08-31 | Discharge: 2016-08-31 | Disposition: A | Discharge status: Home / Self Care | Payer: BLUE CROSS/BLUE SHIELD | Source: Ambulatory Visit | Attending: Internal Medicine | Admitting: Internal Medicine

## 2016-08-31 DIAGNOSIS — R928 Other abnormal and inconclusive findings on diagnostic imaging of breast: Secondary | ICD-10-CM

## 2016-08-31 DIAGNOSIS — N6011 Diffuse cystic mastopathy of right breast: Secondary | ICD-10-CM | POA: Diagnosis not present

## 2016-09-02 DIAGNOSIS — Z01419 Encounter for gynecological examination (general) (routine) without abnormal findings: Secondary | ICD-10-CM | POA: Diagnosis not present

## 2016-09-02 DIAGNOSIS — Z1151 Encounter for screening for human papillomavirus (HPV): Secondary | ICD-10-CM | POA: Diagnosis not present

## 2016-09-02 DIAGNOSIS — Z6830 Body mass index (BMI) 30.0-30.9, adult: Secondary | ICD-10-CM | POA: Diagnosis not present

## 2016-10-20 DIAGNOSIS — Z1329 Encounter for screening for other suspected endocrine disorder: Secondary | ICD-10-CM | POA: Diagnosis not present

## 2016-10-20 DIAGNOSIS — Z130 Encounter for screening for diseases of the blood and blood-forming organs and certain disorders involving the immune mechanism: Secondary | ICD-10-CM | POA: Diagnosis not present

## 2016-10-20 DIAGNOSIS — Z0000 Encounter for general adult medical examination without abnormal findings: Secondary | ICD-10-CM | POA: Diagnosis not present

## 2016-10-20 DIAGNOSIS — Z13220 Encounter for screening for lipoid disorders: Secondary | ICD-10-CM | POA: Diagnosis not present

## 2016-10-20 DIAGNOSIS — R7309 Other abnormal glucose: Secondary | ICD-10-CM | POA: Diagnosis not present

## 2016-10-20 DIAGNOSIS — L900 Lichen sclerosus et atrophicus: Secondary | ICD-10-CM | POA: Diagnosis not present

## 2016-10-20 DIAGNOSIS — Z1321 Encounter for screening for nutritional disorder: Secondary | ICD-10-CM | POA: Diagnosis not present

## 2016-11-05 DIAGNOSIS — K625 Hemorrhage of anus and rectum: Secondary | ICD-10-CM | POA: Diagnosis not present

## 2016-11-05 DIAGNOSIS — Z86010 Personal history of colonic polyps: Secondary | ICD-10-CM | POA: Diagnosis not present

## 2016-11-05 DIAGNOSIS — K5904 Chronic idiopathic constipation: Secondary | ICD-10-CM | POA: Diagnosis not present

## 2016-11-05 DIAGNOSIS — R194 Change in bowel habit: Secondary | ICD-10-CM | POA: Diagnosis not present

## 2016-11-06 DIAGNOSIS — J111 Influenza due to unidentified influenza virus with other respiratory manifestations: Secondary | ICD-10-CM | POA: Diagnosis not present

## 2016-12-09 DIAGNOSIS — F900 Attention-deficit hyperactivity disorder, predominantly inattentive type: Secondary | ICD-10-CM | POA: Diagnosis not present

## 2016-12-10 ENCOUNTER — Other Ambulatory Visit: Payer: Self-pay | Admitting: Internal Medicine

## 2016-12-14 ENCOUNTER — Telehealth: Payer: Self-pay | Admitting: Internal Medicine

## 2016-12-14 MED ORDER — METOPROLOL TARTRATE 25 MG PO TABS: ORAL_TABLET | ORAL | 0 refills | Status: DC

## 2016-12-14 NOTE — Telephone Encounter (Signed)
90 day refill has been sent to [facility]

## 2016-12-14 NOTE — Telephone Encounter (Signed)
Pt has appt 5/14, would like a refill to get her through to her appt. metoprolol tartrate (LOPRESSOR) 25 MG tablet   [facility] on [address].

## 2016-12-25 DIAGNOSIS — H01009 Unspecified blepharitis unspecified eye, unspecified eyelid: Secondary | ICD-10-CM | POA: Diagnosis not present

## 2016-12-25 DIAGNOSIS — H40023 Open angle with borderline findings, high risk, bilateral: Secondary | ICD-10-CM | POA: Diagnosis not present

## 2016-12-25 DIAGNOSIS — H43812 Vitreous degeneration, left eye: Secondary | ICD-10-CM | POA: Diagnosis not present

## 2017-01-04 ENCOUNTER — Ambulatory Visit: Payer: BLUE CROSS/BLUE SHIELD | Admitting: Internal Medicine

## 2017-02-16 ENCOUNTER — Ambulatory Visit: Payer: BLUE CROSS/BLUE SHIELD | Admitting: Internal Medicine

## 2017-02-16 NOTE — Progress Notes (Signed)
Subjective:    Patient ID: Desiree Andrews, female    DOB: January 23, 1956, 61 y.o.   MRN: [id number]  HPI She is here for follow up.  Hypertension: She is taking her medication daily. She is compliant with a low sodium diet.  She denies chest pain, palpitations, edema, shortness of breath and regular headaches. She is exercising regularly -  Getting in 10,000 steps most days.  She does not monitor her blood pressure at home.    Prediabetes:  She is compliant with a low sugar/carbohydrate diet.  She is exercising regularly.  Hyperlipidemia: She is taking her medication daily. She is compliant with a low fat/cholesterol diet. She is exercising regularly. She denies myalgias.   Her eyes are driving her crazy:  Her eyes have been tearing continuously.  She saw her eye doctor.  She was advised to use systane and it is not helping.  She has allergies and is taking zyrtec and flonase, which has not helped.      Medications and allergies reviewed with patient and updated if appropriate.  Patient Active Problem List   Diagnosis Date Noted  . Other abnormal glucose 09/13/2013  . 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 Weight gain 11/27/2010  . ANXIETY 08/06/2010  . 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 NONSPEC ELEVATION OF LEVELS OF TRANSAMINASE/LDH 10/01/2009  . PARESTHESIA, HANDS 05/20/2009  . Vitamin D deficiency 12/18/2008  . Osteopenia 12/18/2008  . BREAST CANCER, HX OF 12/18/2008  . HYPERLIPIDEMIA 10/19/2007  . HEPATITIS C, HX OF 10/19/2007  . Essential hypertension 06/07/2007    Current Outpatient Prescriptions on File Prior to Visit  Medication Sig Dispense Refill  . cetirizine (ZYRTEC) 10 MG tablet Take 10 mg by mouth as needed.    . Cholecalciferol (RA VITAMIN D-3) 1000 UNITS tablet Take 1 tablet (1,000 Units total) by mouth daily.    . clonazePAM (KLONOPIN) 0.5 MG tablet Take 1 tablet by mouth every 8 to 12 hours as needed. 30 tablet 0  . metoprolol  tartrate (LOPRESSOR) 25 MG tablet TAKE 1 TABLET(25 MG) BY MOUTH TWICE DAILY 180 tablet 0   No current facility-administered medications on file prior to visit.     Past Medical History:  Diagnosis Date  . Allergy   . Anxiety   . Cancer ([facility])    breast-left   . Cardiopulmonary arrest ([facility]) 1991   Postpartum,? Pulmonary thromboembolism  . Depression    off cymbalta now  . Hyperlipidemia   . Hypertension     Past Surgical History:  Procedure Laterality Date  . BREAST LUMPECTOMY  2003   snbx-Left, Dr.Rubin; Radiation & Chemotherapy X3  . BREAST LUMPECTOMY WITH NEEDLE LOCALIZATION Right 11/30/2012   Procedure: BREAST LUMPECTOMY WITH NEEDLE LOCALIZATION;  Surgeon: Marcello Moores A. Cornett, MD;  Location: [facility];  Service: General;  Laterality: Right;  needle localization at [place] 8:00   . CARDIOVASCULAR STRESS TEST  2005   Neg  . COLONOSCOPY  2006   Normal  . WRIST SURGERY  2012   Left, Dr.Rowen    Social History   Social History  . Marital status: Divorced    Spouse name: N/A  . Number of children: N/A  . Years of education: N/A   Social History Main Topics  . Smoking status: Former Smoker    Quit date: 08/24/1990  . Smokeless tobacco: Never Used     Comment: smoked [place], up to < 1 ppd  .  Alcohol use 8.4 oz/week    14 Glasses of wine per week     Comment: Socially  . Drug use: No  . Sexual activity: Not on file   Other Topics Concern  . Not on file   Social History Narrative  . No narrative on file    Family History  Problem Relation Age of Onset  . Diabetes Mother   . Hypertension Mother   . Hyperlipidemia Mother   . Stroke Mother 73       02/2009  . Heart attack Maternal Grandmother        in her 45s  . Stomach cancer Maternal Grandfather 33  . Colon cancer Neg Hx   . Rectal cancer Neg Hx   . Colon polyps Neg Hx   . Esophageal cancer Neg Hx     Review of Systems  Constitutional: Positive for fatigue. Negative for  chills and fever.  Respiratory: Negative for cough, shortness of breath and wheezing.   Cardiovascular: Negative for chest pain, palpitations and leg swelling.  Neurological: Negative for light-headedness and headaches.       Objective:   Vitals:   02/17/17 1001  BP: (!) 142/86  Pulse: 66  Resp: 16  Temp: 97.8 F (36.6 C)   Filed Weights   02/17/17 1001  Weight: 173 lb (78.5 kg)   Body mass index is 30.65 kg/m.  Wt Readings from Last 3 Encounters:  02/17/17 173 lb (78.5 kg)  08/09/15 169 lb (76.7 kg)  01/16/15 168 lb (76.2 kg)     Physical Exam Constitutional: Appears well-developed and well-nourished. No distress.  HENT:  Head: Normocephalic and atraumatic.  Neck: Neck supple. No tracheal deviation present. No thyromegaly present.  No cervical lymphadenopathy Cardiovascular: Normal rate, regular rhythm and normal heart sounds.   No murmur heard. No carotid bruit .  No edema Pulmonary/Chest: Effort normal and breath sounds normal. No respiratory distress. No has no wheezes. No rales.  Skin: Skin is warm and dry. Not diaphoretic.  Psychiatric: Normal mood and affect. Behavior is normal.        Assessment & Plan:   See Problem List for Assessment and Plan of chronic medical problems.

## 2017-02-17 ENCOUNTER — Ambulatory Visit (INDEPENDENT_AMBULATORY_CARE_PROVIDER_SITE_OTHER): Payer: BLUE CROSS/BLUE SHIELD | Admitting: Internal Medicine

## 2017-02-17 ENCOUNTER — Encounter: Payer: Self-pay | Admitting: Internal Medicine

## 2017-02-17 VITALS — BP 142/86 | HR 66 | Temp 97.8°F | Resp 16 | Ht 63.0 in | Wt 173.0 lb

## 2017-02-17 DIAGNOSIS — E669 Obesity, unspecified: Secondary | ICD-10-CM | POA: Insufficient documentation

## 2017-02-17 DIAGNOSIS — E6609 Other obesity due to excess calories: Secondary | ICD-10-CM | POA: Diagnosis not present

## 2017-02-17 DIAGNOSIS — R7303 Prediabetes: Secondary | ICD-10-CM | POA: Diagnosis not present

## 2017-02-17 DIAGNOSIS — E782 Mixed hyperlipidemia: Secondary | ICD-10-CM | POA: Diagnosis not present

## 2017-02-17 DIAGNOSIS — I10 Essential (primary) hypertension: Secondary | ICD-10-CM

## 2017-02-17 DIAGNOSIS — E119 Type 2 diabetes mellitus without complications: Secondary | ICD-10-CM | POA: Insufficient documentation

## 2017-02-17 DIAGNOSIS — Z6830 Body mass index (BMI) 30.0-30.9, adult: Secondary | ICD-10-CM

## 2017-02-17 MED ORDER — ATORVASTATIN CALCIUM 10 MG PO TABS: 10.0000 mg | ORAL_TABLET | Freq: Every day | ORAL | 3 refills | Status: DC

## 2017-02-17 NOTE — Assessment & Plan Note (Signed)
Has high cholesterol, htn, prediabetes Active at work  - walking 10,000 steps most days Increase veges, fruits, lean protein, dec carbs, decrease portions Discussed concerns with her weight - developing diabetes, uncontrolled BP and high cholesterol.  Discussed increased risk of CVA, MI, etc. Follow up in 6 months

## 2017-02-17 NOTE — Assessment & Plan Note (Addendum)
BP more controlled at home but occasionally high, elevated at doctor's office - ? controlled Current regimen effective and well tolerated Continue current medication at current dose Stressed regular exercise and weight loss Improve diet

## 2017-02-17 NOTE — Patient Instructions (Addendum)
Have blood work in 2 months to recheck your cholesterol and liver tests - this needs to be fasting.   Medications reviewed and updated.  Changes include starting lipitor 10 mg daily.  Your prescription(s) have been submitted to your pharmacy. Please take as directed and contact our office if you believe you are having problem(s) with the medication(s).   Please followup 6 months

## 2017-02-17 NOTE — Assessment & Plan Note (Signed)
a1c checked by gyn - she will have blood work sent over Continue 10,000 steps most days Decrease portions, decrease sugars/carbs, avoid fast food - increase veges, fruits

## 2017-02-17 NOTE — Assessment & Plan Note (Signed)
Had blood work done recently by gyn - about the same as it was here in 2016 per patient - she will have results sent over Wants to start a statin - will start lipitor 10 mg daily Recheck cmp, lipid in 2 months Follow up in 6 months

## 2017-02-24 ENCOUNTER — Encounter: Payer: Self-pay | Admitting: Internal Medicine

## 2017-02-24 DIAGNOSIS — L900 Lichen sclerosus et atrophicus: Secondary | ICD-10-CM | POA: Insufficient documentation

## 2017-03-04 DIAGNOSIS — H01009 Unspecified blepharitis unspecified eye, unspecified eyelid: Secondary | ICD-10-CM | POA: Diagnosis not present

## 2017-03-04 DIAGNOSIS — H04209 Unspecified epiphora, unspecified lacrimal gland: Secondary | ICD-10-CM | POA: Diagnosis not present

## 2017-03-13 ENCOUNTER — Other Ambulatory Visit: Payer: Self-pay | Admitting: Internal Medicine

## 2017-04-01 DIAGNOSIS — H01009 Unspecified blepharitis unspecified eye, unspecified eyelid: Secondary | ICD-10-CM | POA: Diagnosis not present

## 2017-07-05 DIAGNOSIS — F900 Attention-deficit hyperactivity disorder, predominantly inattentive type: Secondary | ICD-10-CM | POA: Diagnosis not present

## 2017-07-05 DIAGNOSIS — G4709 Other insomnia: Secondary | ICD-10-CM | POA: Diagnosis not present

## 2017-08-28 NOTE — Patient Instructions (Addendum)
  Test(s) ordered today. Your results will be released to [facility] (or called to you) after review, usually within 72hours after test completion. If any changes need to be made, you will be notified at that same time.  All other Health Maintenance issues reviewed.   All recommended immunizations and age-appropriate screenings are up-to-date or discussed.  Flu immunization administered today.   Medications reviewed and updated.  Changes include increasing metoprolol to 50 mg twice daily.   Monitor your BP at home - the goal is ideally less than 130/80.   Your prescription(s) have been submitted to your pharmacy. Please take as directed and contact our office if you believe you are having problem(s) with the medication(s).   Please followup in 6 months

## 2017-08-28 NOTE — Progress Notes (Signed)
Subjective:    Patient ID: Desiree Andrews, female    DOB: 1956/04/19, 62 y.o.   MRN: [id number]  HPI The patient is here for follow up.  Hypertension: She is taking her medication daily. She is compliant with a low sodium diet.  She denies chest pain, palpitations, edema, shortness of breath and regular headaches. She is not exercising regularly.  She does not monitor her blood pressure at home.    Hyperlipidemia: She is taking her medication daily. She is somewhat compliant with a low fat/cholesterol diet. She is not exercising regularly. She denies myalgias.   Prediabetes:  She is not compliant with a low sugar/carbohydrate diet.  She is not exercising regularly.   Medications and allergies reviewed with patient and updated if appropriate.  Patient Active Problem List   Diagnosis Date Noted  . Lichen sclerosus 93/81/8299  . Prediabetes 02/17/2017  . Obese 02/17/2017  . 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 ANXIETY 08/06/2010  . 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 PARESTHESIA, HANDS 05/20/2009  . Vitamin D deficiency 12/18/2008  . Osteopenia 12/18/2008  . BREAST CANCER, HX OF 12/18/2008  . HYPERLIPIDEMIA 10/19/2007  . HEPATITIS C, HX OF 10/19/2007  . Essential hypertension 06/07/2007    Current Outpatient Medications on File Prior to Visit  Medication Sig Dispense Refill  . atorvastatin (LIPITOR) 10 MG tablet Take 1 tablet (10 mg total) by mouth daily. 90 tablet 3  . cetirizine (ZYRTEC) 10 MG tablet Take 10 mg by mouth as needed.    . Cholecalciferol (RA VITAMIN D-3) 1000 UNITS tablet Take 1 tablet (1,000 Units total) by mouth daily.    . metoprolol tartrate (LOPRESSOR) 25 MG tablet TAKE 1 TABLET(25 MG) BY MOUTH TWICE DAILY 180 tablet 1   No current facility-administered medications on file prior to visit.     Past Medical History:  Diagnosis Date  . Allergy   . Anxiety   . Cancer ([facility])    breast-left   . Cardiopulmonary arrest ([place]) 1991     Postpartum,? Pulmonary thromboembolism  . Depression    off cymbalta now  . Hyperlipidemia   . Hypertension     Past Surgical History:  Procedure Laterality Date  . BREAST LUMPECTOMY  2003   snbx-Left, Dr.Rubin; Radiation & Chemotherapy X3  . BREAST LUMPECTOMY WITH NEEDLE LOCALIZATION Right 11/30/2012   Procedure: BREAST LUMPECTOMY WITH NEEDLE LOCALIZATION;  Surgeon: Marcello Moores A. Cornett, MD;  Location: [facility];  Service: General;  Laterality: Right;  needle localization at [place] 8:00   . CARDIOVASCULAR STRESS TEST  2005   Neg  . COLONOSCOPY  2006   Normal  . WRIST SURGERY  2012   Left, Dr.Rowen    Social History   Socioeconomic History  . Marital status: Divorced    Spouse name: None  . Number of children: None  . Years of education: None  . Highest education level: None  Social Needs  . Financial resource strain: None  . Food insecurity - worry: None  . Food insecurity - inability: None  . Transportation needs - medical: None  . Transportation needs - non-medical: None  Occupational History  . None  Tobacco Use  . Smoking status: Former Smoker    Last attempt to quit: 08/24/1990    Years since quitting: 27.0  . Smokeless tobacco: Never Used  . Tobacco comment: smoked [place], up to < 1 ppd  Substance and Sexual Activity  . Alcohol  use: Yes    Alcohol/week: 8.4 oz    Types: 14 Glasses of wine per week    Comment: Socially  . Drug use: No  . Sexual activity: None  Other Topics Concern  . None  Social History Narrative  . None    Family History  Problem Relation Age of Onset  . Diabetes Mother   . Hypertension Mother   . Hyperlipidemia Mother   . Stroke Mother 97       02/2009  . Heart attack Maternal Grandmother        in her 58s  . Stomach cancer Maternal Grandfather 80  . Colon cancer Neg Hx   . Rectal cancer Neg Hx   . Colon polyps Neg Hx   . Esophageal cancer Neg Hx     Review of Systems  Constitutional:  Negative for chills and fever.  Respiratory: Negative for cough, shortness of breath and wheezing.   Cardiovascular: Negative for chest pain, palpitations and leg swelling.  Gastrointestinal: Positive for constipation.  Neurological: Negative for light-headedness and headaches.  Psychiatric/Behavioral: Positive for sleep disturbance.       Objective:   Vitals:   08/30/17 0845  BP: (!) 148/92  Pulse: 86  Resp: 16  Temp: 97.8 F (36.6 C)  SpO2: 98%   Wt Readings from Last 3 Encounters:  08/30/17 171 lb (77.6 kg)  02/17/17 173 lb (78.5 kg)  08/09/15 169 lb (76.7 kg)   Body mass index is 30.29 kg/m.   Physical Exam    Constitutional: Appears well-developed and well-nourished. No distress.  HENT:  Head: Normocephalic and atraumatic.  Neck: Neck supple. No tracheal deviation present. No thyromegaly present.  No cervical lymphadenopathy Cardiovascular: Normal rate, regular rhythm and normal heart sounds.   No murmur heard. No carotid bruit .  No edema Pulmonary/Chest: Effort normal and breath sounds normal. No respiratory distress. No has no wheezes. No rales.  Skin: Skin is warm and dry. Not diaphoretic.  Psychiatric: Normal mood and affect. Behavior is normal.      Assessment & Plan:    See Problem List for Assessment and Plan of chronic medical problems.

## 2017-08-30 ENCOUNTER — Encounter: Payer: Self-pay | Admitting: Internal Medicine

## 2017-08-30 ENCOUNTER — Other Ambulatory Visit (INDEPENDENT_AMBULATORY_CARE_PROVIDER_SITE_OTHER): Payer: BLUE CROSS/BLUE SHIELD

## 2017-08-30 ENCOUNTER — Ambulatory Visit: Payer: BLUE CROSS/BLUE SHIELD | Admitting: Internal Medicine

## 2017-08-30 VITALS — BP 148/92 | HR 86 | Temp 97.8°F | Resp 16 | Wt 171.0 lb

## 2017-08-30 DIAGNOSIS — I10 Essential (primary) hypertension: Secondary | ICD-10-CM | POA: Diagnosis not present

## 2017-08-30 DIAGNOSIS — Z23 Encounter for immunization: Secondary | ICD-10-CM

## 2017-08-30 DIAGNOSIS — R7303 Prediabetes: Secondary | ICD-10-CM

## 2017-08-30 DIAGNOSIS — E782 Mixed hyperlipidemia: Secondary | ICD-10-CM

## 2017-08-30 LAB — CBC WITH DIFFERENTIAL/PLATELET
BASOS PCT: 1.5 % (ref 0.0–3.0)
Basophils Absolute: 0.1 10*3/uL (ref 0.0–0.1)
EOS PCT: 3.2 % (ref 0.0–5.0)
Eosinophils Absolute: 0.3 10*3/uL (ref 0.0–0.7)
HCT: 46.8 % — ABNORMAL HIGH (ref 36.0–46.0)
Hemoglobin: 16 g/dL — ABNORMAL HIGH (ref 12.0–15.0)
Lymphocytes Relative: 38.7 % (ref 12.0–46.0)
Lymphs Abs: 3.3 10*3/uL (ref 0.7–4.0)
MCHC: 34.1 g/dL (ref 30.0–36.0)
MCV: 95.3 fl (ref 78.0–100.0)
MONO ABS: 0.5 10*3/uL (ref 0.1–1.0)
MONOS PCT: 5.9 % (ref 3.0–12.0)
Neutro Abs: 4.4 10*3/uL (ref 1.4–7.7)
Neutrophils Relative %: 50.7 % (ref 43.0–77.0)
Platelets: 287 10*3/uL (ref 150.0–400.0)
RBC: 4.91 Mil/uL (ref 3.87–5.11)
RDW: 13 % (ref 11.5–15.5)
WBC: 8.7 10*3/uL (ref 4.0–10.5)

## 2017-08-30 LAB — COMPREHENSIVE METABOLIC PANEL
ALT: 27 U/L (ref 0–35)
AST: 21 U/L (ref 0–37)
Albumin: 4.7 g/dL (ref 3.5–5.2)
Alkaline Phosphatase: 70 U/L (ref 39–117)
BUN: 12 mg/dL (ref 6–23)
CHLORIDE: 100 meq/L (ref 96–112)
CO2: 28 meq/L (ref 19–32)
Calcium: 10 mg/dL (ref 8.4–10.5)
Creatinine, Ser: 0.78 mg/dL (ref 0.40–1.20)
GFR: 79.19 mL/min (ref >60.00)
GLUCOSE: 139 mg/dL — AB (ref 70–99)
Potassium: 4.4 mEq/L (ref 3.5–5.1)
SODIUM: 137 meq/L (ref 135–145)
Total Bilirubin: 0.5 mg/dL (ref 0.2–1.2)
Total Protein: 7.6 g/dL (ref 6.0–8.3)

## 2017-08-30 LAB — HEMOGLOBIN A1C: HEMOGLOBIN A1C: 6.1 % (ref 4.6–6.5)

## 2017-08-30 LAB — LIPID PANEL
CHOLESTEROL: 269 mg/dL — AB (ref 0–200)
HDL: 51 mg/dL (ref >39.00)
NONHDL: 218.03
Total CHOL/HDL Ratio: 5
Triglycerides: 362 mg/dL — ABNORMAL HIGH (ref 0.0–149.0)
VLDL: 72.4 mg/dL — AB (ref 0.0–40.0)

## 2017-08-30 LAB — TSH: TSH: 1.19 u[IU]/mL (ref 0.35–4.50)

## 2017-08-30 LAB — LDL CHOLESTEROL, DIRECT: LDL DIRECT: 173 mg/dL

## 2017-08-30 MED ORDER — METOPROLOL TARTRATE 50 MG PO TABS: 50.0000 mg | ORAL_TABLET | Freq: Two times a day (BID) | ORAL | 3 refills | Status: DC

## 2017-08-30 NOTE — Assessment & Plan Note (Addendum)
She is not taking the lipitor religiously  Check lipid panel, cmp, tsh Continue daily statin - will start taking it daily Regular exercise and healthy diet encouraged

## 2017-08-30 NOTE — Assessment & Plan Note (Addendum)
BP not well controlled Increase metoprolol to 50 mg bid Monitor BP at home Cmp, cbc, tsh

## 2017-08-30 NOTE — Assessment & Plan Note (Signed)
Check a1c Low sugar / carb diet Stressed regular exercise, weight loss  

## 2018-01-19 DIAGNOSIS — F900 Attention-deficit hyperactivity disorder, predominantly inattentive type: Secondary | ICD-10-CM | POA: Diagnosis not present

## 2018-01-20 ENCOUNTER — Other Ambulatory Visit: Payer: Self-pay

## 2018-02-22 ENCOUNTER — Encounter: Payer: Self-pay | Admitting: Internal Medicine

## 2018-02-22 NOTE — Progress Notes (Signed)
Subjective:    Patient ID: Desiree Andrews, female    DOB: May 19, 1956, 61 y.o.   MRN: [id number]  HPI She is here for a physical exam.   Her left knee catches at times and then hurts.  It almost feels like the pain is coming from her hip.  It is not that bad yet and only has happened a few times. She is not exercising at this time.    She will be seeing Dr Collene Mares soon for a colonoscopy.  She has constipation, which is chronic. She was thinking about trying metamucil.  She has some bleeding after having a BM when constipated.    Sleep disturbance: she can fall asleep ok, but wakes a lot at night and never feels rested.  Trazodone was not effective.   Medications and allergies reviewed with patient and updated if appropriate.  Patient Active Problem List   Diagnosis Date Noted  . Hepatitis C 02/23/2018  . Lichen sclerosus 55/[id number]/[id number]  . Prediabetes 02/17/2017  . Obese 02/17/2017  . 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 ANXIETY 08/06/2010  . 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 PARESTHESIA, HANDS 05/20/2009  . Vitamin D deficiency 12/18/2008  . Osteopenia 12/18/2008  . BREAST CANCER, HX OF 12/18/2008  . HYPERLIPIDEMIA 10/19/2007  . HEPATITIS C, HX OF 10/19/2007  . Essential hypertension 06/07/2007    Current Outpatient Medications on File Prior to Visit  Medication Sig Dispense Refill  . amphetamine-dextroamphetamine (ADDERALL) 20 MG tablet TK 1 T PO BID  0  . atorvastatin (LIPITOR) 10 MG tablet Take 1 tablet (10 mg total) by mouth daily. 90 tablet 3  . metoprolol tartrate (LOPRESSOR) 50 MG tablet Take 1 tablet (50 mg total) by mouth 2 (two) times daily. 180 tablet 3  . cetirizine (ZYRTEC) 10 MG tablet Take 10 mg by mouth as needed.    . Cholecalciferol (RA VITAMIN D-3) 1000 UNITS tablet Take 1 tablet (1,000 Units total) by mouth daily. (Patient not taking: Reported on 02/23/2018)     No current facility-administered medications on file prior to visit.      Past Medical History:  Diagnosis Date  . Allergy   . Anxiety   . Cancer ([place])    breast-left   . Cardiopulmonary arrest ([place]) 1991   Postpartum,? Pulmonary thromboembolism  . Depression    off cymbalta now  . Hyperlipidemia   . Hypertension     Past Surgical History:  Procedure Laterality Date  . BREAST LUMPECTOMY  2003   snbx-Left, Dr.Rubin; Radiation & Chemotherapy X3  . BREAST LUMPECTOMY WITH NEEDLE LOCALIZATION Right 11/30/2012   Procedure: BREAST LUMPECTOMY WITH NEEDLE LOCALIZATION;  Surgeon: Marcello Moores A. Cornett, MD;  Location: [facility];  Service: General;  Laterality: Right;  needle localization at [place] 8:00   . CARDIOVASCULAR STRESS TEST  2005   Neg  . COLONOSCOPY  2006   Normal  . WRIST SURGERY  2012   Left, Dr.Rowen    Social History   Socioeconomic History  . Marital status: Divorced    Spouse name: Not on file  . Number of children: Not on file  . Years of education: Not on file  . Highest education level: Not on file  Occupational History  . Not on file  Social Needs  . Financial resource strain: Not on file  . Food insecurity:    Worry: Not on file    Inability: Not on file  . Transportation needs:  Medical: Not on file    Non-medical: Not on file  Tobacco Use  . Smoking status: Former Smoker    Last attempt to quit: 08/24/1990    Years since quitting: 27.5  . Smokeless tobacco: Never Used  . Tobacco comment: smoked [place], up to < 1 ppd  Substance and Sexual Activity  . Alcohol use: Yes    Alcohol/week: 8.4 oz    Types: 14 Glasses of wine per week    Comment: Socially  . Drug use: No  . Sexual activity: Not on file  Lifestyle  . Physical activity:    Days per week: Not on file    Minutes per session: Not on file  . Stress: Not on file  Relationships  . Social connections:    Talks on phone: Not on file    Gets together: Not on file    Attends religious service: Not on file    Active member of  club or organization: Not on file    Attends meetings of clubs or organizations: Not on file    Relationship status: Not on file  Other Topics Concern  . Not on file  Social History Narrative  . Not on file    Family History  Problem Relation Age of Onset  . Diabetes Mother   . Hypertension Mother   . Hyperlipidemia Mother   . Stroke Mother 70       02/2009  . Heart attack Maternal Grandmother        in her 57s  . Stomach cancer Maternal Grandfather 73  . Colon cancer Neg Hx   . Rectal cancer Neg Hx   . Colon polyps Neg Hx   . Esophageal cancer Neg Hx     Review of Systems  Constitutional: Negative for chills and fever.  Eyes: Negative for visual disturbance.  Respiratory: Negative for cough, shortness of breath and wheezing.   Cardiovascular: Negative for chest pain, palpitations and leg swelling.  Gastrointestinal: Positive for anal bleeding and constipation. Negative for abdominal pain, blood in stool, diarrhea and nausea.       No gerd  Genitourinary: Negative for dysuria and hematuria.  Musculoskeletal: Positive for arthralgias (knee - catches). Negative for back pain.  Skin: Negative for color change and rash.  Neurological: Negative for dizziness, light-headedness and headaches.  Psychiatric/Behavioral: Positive for sleep disturbance. Negative for dysphoric mood. The patient is not nervous/anxious.        Objective:   Vitals:   02/23/18 0754  BP: (!) 152/90  Temp: 98 F (36.7 C)   Filed Weights   02/23/18 0754  Weight: 172 lb (78 kg)   Body mass index is 30.47 kg/m.  BP Readings from Last 3 Encounters:  02/23/18 (!) 152/90  08/30/17 (!) 148/92  02/17/17 (!) 142/86    Wt Readings from Last 3 Encounters:  02/23/18 172 lb (78 kg)  08/30/17 171 lb (77.6 kg)  02/17/17 173 lb (78.5 kg)     Physical Exam Constitutional: She appears well-developed and well-nourished. No distress.  HENT:  Head: Normocephalic and atraumatic.  Right Ear: External ear  normal. Normal ear canal and TM Left Ear: External ear normal.  Normal ear canal and TM Mouth/Throat: Oropharynx is clear and moist.  Eyes: Conjunctivae and EOM are normal.  Neck: Neck supple. No tracheal deviation present. No thyromegaly present.  No carotid bruit  Cardiovascular: Normal rate, regular rhythm and normal heart sounds.   No murmur heard.  No edema. Pulmonary/Chest: Effort normal  and breath sounds normal. No respiratory distress. She has no wheezes. She has no rales.  Breast: deferred to Gyn Abdominal: Soft. She exhibits no distension. There is no tenderness.  Lymphadenopathy: She has no cervical adenopathy.  Skin: Skin is warm and dry. She is not diaphoretic.  Psychiatric: She has a normal mood and affect. Her behavior is normal.        Assessment & Plan:   Physical exam: Screening blood work   ordered Immunizations    Had shingrix, others up to date Colonoscopy   Up to date  Mammogram   Up to date  Gyn    Up to date  Dexa    Up to date - normal in 2015 Eye exams    Up to date  EKG     Done 11/2012 Exercise  None - encouraged regular exercise - discussed the multiple benefits of it Weight  Advised weight loss Skin      no concerns, sees derm annually Substance abuse   2 glasses of wine a night - advised to decrease to 1 glass  See Problem List for Assessment and Plan of chronic medical problems.    FU in 6 months

## 2018-02-22 NOTE — Patient Instructions (Addendum)
Monitor your BP - it should be less than 140/90.  Test(s) ordered today. Your results will be released to Desiree Andrews (or called to you) after review, usually within 72hours after test completion. If any changes need to be made, you will be notified at that same time.  All other Health Maintenance issues reviewed.   All recommended immunizations and age-appropriate screenings are up-to-date or discussed.  No immunizations administered today.   Medications reviewed and updated.  Changes include starting amlodipine 2.5 mg daily.  Your prescription(s) have been submitted to your pharmacy. Please take as directed and contact our office if you believe you are having problem(s) with the medication(s).   Please followup in 6 months   Health Maintenance, Female Adopting a healthy lifestyle and getting preventive care can go a long way to promote health and wellness. Talk with your health care provider about what schedule of regular examinations is right for you. This is a good chance for you to check in with your provider about disease prevention and staying healthy. In between checkups, there are plenty of things you can do on your own. Experts have done a lot of research about which lifestyle changes and preventive measures are most likely to keep you healthy. Ask your health care provider for more information. Weight and diet Eat a healthy diet  Be sure to include plenty of vegetables, fruits, low-fat dairy products, and lean protein.  Do not eat a lot of foods high in solid fats, added sugars, or salt.  Get regular exercise. This is one of the most important things you can do for your health. ? Most adults should exercise for at least 150 minutes each week. The exercise should increase your heart rate and make you sweat (moderate-intensity exercise). ? Most adults should also do strengthening exercises at least twice a week. This is in addition to the moderate-intensity exercise.  Maintain a  healthy weight  Body mass index (BMI) is a measurement that can be used to identify possible weight problems. It estimates body fat based on height and weight. Your health care provider can help determine your BMI and help you achieve or maintain a healthy weight.  For females 26 years of age and older: ? A BMI below 18.5 is considered underweight. ? A BMI of 18.5 to 24.9 is normal. ? A BMI of 25 to 29.9 is considered overweight. ? A BMI of 30 and above is considered obese.  Watch levels of cholesterol and blood lipids  You should start having your blood tested for lipids and cholesterol at 64 years of age, then have this test every 5 years.  You may need to have your cholesterol levels checked more often if: ? Your lipid or cholesterol levels are high. ? You are older than 64 years of age. ? You are at high risk for heart disease.  Cancer screening Lung Cancer  Lung cancer screening is recommended for adults 48-69 years old who are at high risk for lung cancer because of a history of smoking.  A yearly low-dose CT scan of the lungs is recommended for people who: ? Currently smoke. ? Have quit within the past 15 years. ? Have at least a 30-pack-year history of smoking. A pack year is smoking an average of one pack of cigarettes a day for 1 year.  Yearly screening should continue until it has been 15 years since you quit.  Yearly screening should stop if you develop a health problem that would prevent you  from having lung cancer treatment.  Breast Cancer  Practice breast self-awareness. This means understanding how your breasts normally appear and feel.  It also means doing regular breast self-exams. Let your health care provider know about any changes, no matter how small.  If you are in your 20s or 30s, you should have a clinical breast exam (CBE) by a health care provider every 1-3 years as part of a regular health exam.  If you are 49 or older, have a CBE every year. Also  consider having a breast X-ray (mammogram) every year.  If you have a family history of breast cancer, talk to your health care provider about genetic screening.  If you are at high risk for breast cancer, talk to your health care provider about having an MRI and a mammogram every year.  Breast cancer gene (BRCA) assessment is recommended for women who have family members with BRCA-related cancers. BRCA-related cancers include: ? Breast. ? Ovarian. ? Tubal. ? Peritoneal cancers.  Results of the assessment will determine the need for genetic counseling and BRCA1 and BRCA2 testing.  Cervical Cancer Your health care provider may recommend that you be screened regularly for cancer of the pelvic organs (ovaries, uterus, and vagina). This screening involves a pelvic examination, including checking for microscopic changes to the surface of your cervix (Pap test). You may be encouraged to have this screening done every 3 years, beginning at age 75.  For women ages 43-65, health care providers may recommend pelvic exams and Pap testing every 3 years, or they may recommend the Pap and pelvic exam, combined with testing for human papilloma virus (HPV), every 5 years. Some types of HPV increase your risk of cervical cancer. Testing for HPV may also be done on women of any age with unclear Pap test results.  Other health care providers may not recommend any screening for nonpregnant women who are considered low risk for pelvic cancer and who do not have symptoms. Ask your health care provider if a screening pelvic exam is right for you.  If you have had past treatment for cervical cancer or a condition that could lead to cancer, you need Pap tests and screening for cancer for at least 20 years after your treatment. If Pap tests have been discontinued, your risk factors (such as having a new sexual partner) need to be reassessed to determine if screening should resume. Some women have medical problems that  increase the chance of getting cervical cancer. In these cases, your health care provider may recommend more frequent screening and Pap tests.  Colorectal Cancer  This type of cancer can be detected and often prevented.  Routine colorectal cancer screening usually begins at 64 years of age and continues through 64 years of age.  Your health care provider may recommend screening at an earlier age if you have risk factors for colon cancer.  Your health care provider may also recommend using home test kits to check for hidden blood in the stool.  A small camera at the end of a tube can be used to examine your colon directly (sigmoidoscopy or colonoscopy). This is done to check for the earliest forms of colorectal cancer.  Routine screening usually begins at age 10.  Direct examination of the colon should be repeated every 5-10 years through 63 years of age. However, you may need to be screened more often if early forms of precancerous polyps or small growths are found.  Skin Cancer  Check your skin from  head to toe regularly.  Tell your health care provider about any new moles or changes in moles, especially if there is a change in a mole's shape or color.  Also tell your health care provider if you have a mole that is larger than the size of a pencil eraser.  Always use sunscreen. Apply sunscreen liberally and repeatedly throughout the day.  Protect yourself by wearing long sleeves, pants, a wide-brimmed hat, and sunglasses whenever you are outside.  Heart disease, diabetes, and high blood pressure  High blood pressure causes heart disease and increases the risk of stroke. High blood pressure is more likely to develop in: ? People who have blood pressure in the high end of the normal range (130-139/85-89 mm Hg). ? People who are overweight or obese. ? People who are African American.  If you are 74-75 years of age, have your blood pressure checked every 3-5 years. If you are 51  years of age or older, have your blood pressure checked every year. You should have your blood pressure measured twice-once when you are at a hospital or clinic, and once when you are not at a hospital or clinic. Record the average of the two measurements. To check your blood pressure when you are not at a hospital or clinic, you can use: ? An automated blood pressure machine at a pharmacy. ? A home blood pressure monitor.  If you are between 80 years and 61 years old, ask your health care provider if you should take aspirin to prevent strokes.  Have regular diabetes screenings. This involves taking a blood sample to check your fasting blood sugar level. ? If you are at a normal weight and have a low risk for diabetes, have this test once every three years after 64 years of age. ? If you are overweight and have a high risk for diabetes, consider being tested at a younger age or more often. Preventing infection Hepatitis B  If you have a higher risk for hepatitis B, you should be screened for this virus. You are considered at high risk for hepatitis B if: ? You were born in a country where hepatitis B is common. Ask your health care provider which countries are considered high risk. ? Your parents were born in a high-risk country, and you have not been immunized against hepatitis B (hepatitis B vaccine). ? You have HIV or AIDS. ? You use needles to inject street drugs. ? You live with someone who has hepatitis B. ? You have had sex with someone who has hepatitis B. ? You get hemodialysis treatment. ? You take certain medicines for conditions, including cancer, organ transplantation, and autoimmune conditions.  Hepatitis C  Blood testing is recommended for: ? Everyone born from [birth year] through 1967. ? Anyone with known risk factors for hepatitis C.  Sexually transmitted infections (STIs)  You should be screened for sexually transmitted infections (STIs) including gonorrhea and chlamydia  if: ? You are sexually active and are younger than 64 years of age. ? You are older than 64 years of age and your health care provider tells you that you are at risk for this type of infection. ? Your sexual activity has changed since you were last screened and you are at an increased risk for chlamydia or gonorrhea. Ask your health care provider if you are at risk.  If you do not have HIV, but are at risk, it may be recommended that you take a prescription medicine daily to prevent  HIV infection. This is called pre-exposure prophylaxis (PrEP). You are considered at risk if: ? You are sexually active and do not regularly use condoms or know the HIV status of your partner(s). ? You take drugs by injection. ? You are sexually active with a partner who has HIV.  Talk with your health care provider about whether you are at high risk of being infected with HIV. If you choose to begin PrEP, you should first be tested for HIV. You should then be tested every 3 months for as long as you are taking PrEP. Pregnancy  If you are premenopausal and you may become pregnant, ask your health care provider about preconception counseling.  If you may become pregnant, take 400 to 800 micrograms (mcg) of folic acid every day.  If you want to prevent pregnancy, talk to your health care provider about birth control (contraception). Osteoporosis and menopause  Osteoporosis is a disease in which the bones lose minerals and strength with aging. This can result in serious bone fractures. Your risk for osteoporosis can be identified using a bone density scan.  If you are 9 years of age or older, or if you are at risk for osteoporosis and fractures, ask your health care provider if you should be screened.  Ask your health care provider whether you should take a calcium or vitamin D supplement to lower your risk for osteoporosis.  Menopause may have certain physical symptoms and risks.  Hormone replacement therapy  may reduce some of these symptoms and risks. Talk to your health care provider about whether hormone replacement therapy is right for you. Follow these instructions at home:  Schedule regular health, dental, and eye exams.  Stay current with your immunizations.  Do not use any tobacco products including cigarettes, chewing tobacco, or electronic cigarettes.  If you are pregnant, do not drink alcohol.  If you are breastfeeding, limit how much and how often you drink alcohol.  Limit alcohol intake to no more than 1 drink per day for nonpregnant women. One drink equals 12 ounces of beer, 5 ounces of wine, or 1 ounces of hard liquor.  Do not use street drugs.  Do not share needles.  Ask your health care provider for help if you need support or information about quitting drugs.  Tell your health care provider if you often feel depressed.  Tell your health care provider if you have ever been abused or do not feel safe at home. This information is not intended to replace advice given to you by your health care provider. Make sure you discuss any questions you have with your health care provider. Document Released: 02/23/2011 Document Revised: 01/16/2016 Document Reviewed: 05/14/2015 Elsevier Interactive Patient Education  [employer].

## 2018-02-23 ENCOUNTER — Ambulatory Visit (INDEPENDENT_AMBULATORY_CARE_PROVIDER_SITE_OTHER): Payer: BLUE CROSS/BLUE SHIELD | Admitting: Internal Medicine

## 2018-02-23 ENCOUNTER — Other Ambulatory Visit (INDEPENDENT_AMBULATORY_CARE_PROVIDER_SITE_OTHER): Payer: BLUE CROSS/BLUE SHIELD

## 2018-02-23 ENCOUNTER — Encounter: Payer: Self-pay | Admitting: Internal Medicine

## 2018-02-23 VITALS — BP 152/90 | Temp 98.0°F | Ht 63.0 in | Wt 172.0 lb

## 2018-02-23 DIAGNOSIS — G479 Sleep disorder, unspecified: Secondary | ICD-10-CM

## 2018-02-23 DIAGNOSIS — B182 Chronic viral hepatitis C: Secondary | ICD-10-CM

## 2018-02-23 DIAGNOSIS — E559 Vitamin D deficiency, unspecified: Secondary | ICD-10-CM | POA: Diagnosis not present

## 2018-02-23 DIAGNOSIS — Z6830 Body mass index (BMI) 30.0-30.9, adult: Secondary | ICD-10-CM

## 2018-02-23 DIAGNOSIS — I10 Essential (primary) hypertension: Secondary | ICD-10-CM | POA: Diagnosis not present

## 2018-02-23 DIAGNOSIS — R7303 Prediabetes: Secondary | ICD-10-CM

## 2018-02-23 DIAGNOSIS — Z853 Personal history of malignant neoplasm of breast: Secondary | ICD-10-CM

## 2018-02-23 DIAGNOSIS — Z0000 Encounter for general adult medical examination without abnormal findings: Secondary | ICD-10-CM | POA: Diagnosis not present

## 2018-02-23 DIAGNOSIS — B1920 Unspecified viral hepatitis C without hepatic coma: Secondary | ICD-10-CM | POA: Insufficient documentation

## 2018-02-23 DIAGNOSIS — E782 Mixed hyperlipidemia: Secondary | ICD-10-CM

## 2018-02-23 DIAGNOSIS — E6609 Other obesity due to excess calories: Secondary | ICD-10-CM

## 2018-02-23 DIAGNOSIS — M8580 Other specified disorders of bone density and structure, unspecified site: Secondary | ICD-10-CM

## 2018-02-23 LAB — COMPREHENSIVE METABOLIC PANEL
ALT: 32 U/L (ref 0–35)
AST: 29 U/L (ref 0–37)
Albumin: 4.4 g/dL (ref 3.5–5.2)
Alkaline Phosphatase: 63 U/L (ref 39–117)
BILIRUBIN TOTAL: 0.4 mg/dL (ref 0.2–1.2)
BUN: 13 mg/dL (ref 6–23)
CALCIUM: 9.4 mg/dL (ref 8.4–10.5)
CO2: 27 meq/L (ref 19–32)
CREATININE: 0.71 mg/dL (ref 0.40–1.20)
Chloride: 103 mEq/L (ref 96–112)
GFR: 88.13 mL/min (ref >60.00)
GLUCOSE: 117 mg/dL — AB (ref 70–99)
Potassium: 4.6 mEq/L (ref 3.5–5.1)
SODIUM: 139 meq/L (ref 135–145)
Total Protein: 7.1 g/dL (ref 6.0–8.3)

## 2018-02-23 LAB — CBC WITH DIFFERENTIAL/PLATELET
Basophils Absolute: 0.1 10*3/uL (ref 0.0–0.1)
Basophils Relative: 0.8 % (ref 0.0–3.0)
EOS ABS: 0.2 10*3/uL (ref 0.0–0.7)
Eosinophils Relative: 3.3 % (ref 0.0–5.0)
HCT: 42.6 % (ref 36.0–46.0)
Hemoglobin: 14.6 g/dL (ref 12.0–15.0)
LYMPHS ABS: 2.9 10*3/uL (ref 0.7–4.0)
Lymphocytes Relative: 43.2 % (ref 12.0–46.0)
MCHC: 34.3 g/dL (ref 30.0–36.0)
MCV: 94.4 fl (ref 78.0–100.0)
MONO ABS: 0.5 10*3/uL (ref 0.1–1.0)
Monocytes Relative: 7.7 % (ref 3.0–12.0)
NEUTROS ABS: 3 10*3/uL (ref 1.4–7.7)
NEUTROS PCT: 45 % (ref 43.0–77.0)
PLATELETS: 231 10*3/uL (ref 150.0–400.0)
RBC: 4.52 Mil/uL (ref 3.87–5.11)
RDW: 12.9 % (ref 11.5–15.5)
WBC: 6.6 10*3/uL (ref 4.0–10.5)

## 2018-02-23 LAB — LIPID PANEL
CHOL/HDL RATIO: 5
Cholesterol: 240 mg/dL — ABNORMAL HIGH (ref 0–200)
HDL: 46.7 mg/dL (ref >39.00)
Triglycerides: 586 mg/dL — ABNORMAL HIGH (ref 0.0–149.0)

## 2018-02-23 LAB — LDL CHOLESTEROL, DIRECT: LDL DIRECT: 105 mg/dL

## 2018-02-23 LAB — VITAMIN D 25 HYDROXY (VIT D DEFICIENCY, FRACTURES): VITD: 26.91 ng/mL — AB (ref 30.00–100.00)

## 2018-02-23 LAB — TSH: TSH: 0.77 u[IU]/mL (ref 0.35–4.50)

## 2018-02-23 LAB — HEMOGLOBIN A1C: Hgb A1c MFr Bld: 6.1 % (ref 4.6–6.5)

## 2018-02-23 MED ORDER — AMLODIPINE BESYLATE 2.5 MG PO TABS: 2.5000 mg | ORAL_TABLET | Freq: Every day | ORAL | 3 refills | Status: DC

## 2018-02-23 NOTE — Assessment & Plan Note (Signed)
dexa done 2015 - normal - up to date Taking vitamin d daily Exercises regularly dexa due next year

## 2018-02-23 NOTE — Assessment & Plan Note (Signed)
Mammogram up to date 71 with gyn No evidence of recurrence

## 2018-02-23 NOTE — Assessment & Plan Note (Signed)
Check lipid panel  Continue daily statin Regular exercise and healthy diet encouraged  

## 2018-02-23 NOTE — Assessment & Plan Note (Signed)
H/o positive hep c in past - no treatment ? Latent or cleared Check hep c Ab

## 2018-02-23 NOTE — Assessment & Plan Note (Signed)
Advised starting exercise Dec portions, healthy diet Stressed weight loss

## 2018-02-23 NOTE — Assessment & Plan Note (Signed)
Taking vitamin d daily Check level 

## 2018-02-23 NOTE — Assessment & Plan Note (Signed)
Not controlled Discussed risks of uncontrolled BP Start amlodipine 2.5 mg daily Continue metoprolol Monitor bp at home - goal of less than 140/90 Start regular exercise Work on weight loss Cmp, tsh F/u in 6 months sooner if bp not improved

## 2018-02-23 NOTE — Assessment & Plan Note (Signed)
Check a1c Low sugar / carb diet Stressed regular exercise, weight loss  

## 2018-02-23 NOTE — Assessment & Plan Note (Signed)
Trazodone not effective Not exercising Stressed exercise Will try a probiotic nighttime supplement

## 2018-02-28 LAB — HCV RNA,QUANTITATIVE REAL TIME PCR

## 2018-02-28 LAB — HEPATITIS C ANTIBODY
Hepatitis C Ab: REACTIVE — AB
SIGNAL TO CUT-OFF: 16.6 — AB (ref <1.00)

## 2018-03-01 ENCOUNTER — Telehealth: Payer: Self-pay | Admitting: Emergency Medicine

## 2018-03-01 NOTE — Telephone Encounter (Signed)
I have not contacted pt and do not see in chart where pt would need to be contacted.

## 2018-03-01 NOTE — Telephone Encounter (Signed)
Copied from [place] [id number]. Topic: Inquiry >> Mar 01, 2018  2:28 PM Pricilla Handler wrote: Reason for CRM: Patient called returning a call from the office. The patient would like a return call from whomever called her before.       Thank You!!!  >> Mar 01, 2018  2:44 PM Morphies, Isidoro Donning wrote: Did you speak to this patient?

## 2018-03-02 ENCOUNTER — Telehealth: Payer: Self-pay | Admitting: Emergency Medicine

## 2018-03-02 DIAGNOSIS — R768 Other specified abnormal immunological findings in serum: Secondary | ICD-10-CM

## 2018-03-02 NOTE — Telephone Encounter (Signed)
Copied from [place] [id number]. Topic: Quick Communication - Lab Results >> Mar 02, 2018  2:13 PM Lennox Solders wrote: Pt would like nurse to go over her blood work results. Pt did view results on mychart

## 2018-03-02 NOTE — Telephone Encounter (Signed)
Her kidney function, thyroid function, liver tests and blood counts are normal.  Her cholesterol is high, especially her triglycerides - she needs to exercise, improve her diet and work on weight loss.  I also think she needs another cholesterol medication that will target the triglycerides called fenofibrate.   Her vitamin d level is low - she needs to restart her vitamin d or if she is taking it increase her vitamin d intake by 1000 units daily.   Her sugars are stable in the prediabetic range.    Her hepatitis C antibody is positive, but this is consistent with her known history of hepatitis C.  The test to determine how much virus she has was not able to be completed because they did not have enough blood.  I need her to come back and give another blood sample so we can test this.  Once you have been exposure to hepatitis C the test will always be positive - for the rest of her life.  If there is no detectable virus then she does not have an active infection - she may have cleared it or it is latent.    Test ordered.  Let me know about the fenofibrate.

## 2018-03-02 NOTE — Telephone Encounter (Signed)
Blood work has not been resulted, please advise.

## 2018-03-03 MED ORDER — FENOFIBRATE 145 MG PO TABS: 145.0000 mg | ORAL_TABLET | Freq: Every day | ORAL | 5 refills | Status: DC

## 2018-03-03 NOTE — Telephone Encounter (Signed)
Spoke with pt to inform. Pt is okay with having additional cholesterol medication sent to [facility]. Pt will come back to the lab to have additional blood work done.

## 2018-03-16 ENCOUNTER — Other Ambulatory Visit: Payer: BLUE CROSS/BLUE SHIELD

## 2018-03-16 DIAGNOSIS — R768 Other specified abnormal immunological findings in serum: Secondary | ICD-10-CM

## 2018-03-16 DIAGNOSIS — B1920 Unspecified viral hepatitis C without hepatic coma: Secondary | ICD-10-CM | POA: Diagnosis not present

## 2018-03-18 LAB — HEPATITIS C RNA QUANTITATIVE
HCV QUANT LOG: NOT DETECTED {Log_IU}/mL
HCV RNA, PCR, QN: <15 IU/mL

## 2018-03-20 ENCOUNTER — Encounter: Payer: Self-pay | Admitting: Internal Medicine

## 2018-03-22 DIAGNOSIS — Z1211 Encounter for screening for malignant neoplasm of colon: Secondary | ICD-10-CM | POA: Diagnosis not present

## 2018-03-22 DIAGNOSIS — K625 Hemorrhage of anus and rectum: Secondary | ICD-10-CM | POA: Diagnosis not present

## 2018-03-22 DIAGNOSIS — K5730 Diverticulosis of large intestine without perforation or abscess without bleeding: Secondary | ICD-10-CM | POA: Diagnosis not present

## 2018-03-22 DIAGNOSIS — Z86010 Personal history of colonic polyps: Secondary | ICD-10-CM | POA: Diagnosis not present

## 2018-06-01 DIAGNOSIS — K5730 Diverticulosis of large intestine without perforation or abscess without bleeding: Secondary | ICD-10-CM | POA: Diagnosis not present

## 2018-06-01 DIAGNOSIS — Z1211 Encounter for screening for malignant neoplasm of colon: Secondary | ICD-10-CM | POA: Diagnosis not present

## 2018-06-01 DIAGNOSIS — K5904 Chronic idiopathic constipation: Secondary | ICD-10-CM | POA: Diagnosis not present

## 2018-06-01 LAB — HM COLONOSCOPY

## 2018-06-07 ENCOUNTER — Encounter: Payer: Self-pay | Admitting: Internal Medicine

## 2018-06-22 DIAGNOSIS — Z23 Encounter for immunization: Secondary | ICD-10-CM | POA: Diagnosis not present

## 2018-07-05 ENCOUNTER — Telehealth: Payer: Self-pay | Admitting: *Deleted

## 2018-07-05 MED ORDER — CLOBETASOL PROPIONATE 0.05 % EX CREA: 1.0000 "application " | TOPICAL_CREAM | Freq: Two times a day (BID) | CUTANEOUS | 0 refills | Status: DC

## 2018-07-05 NOTE — Telephone Encounter (Signed)
Patient called requesting refill on clobetasol cream 2.54% for Lichen sclerous, [facility] chart has arrived, no annual exam scheduled yet in process with speaking with insurance about coverage. Last seen in feb 2018 for annual exam. Patient asked if 1 tube can be sent?

## 2018-07-05 NOTE — Telephone Encounter (Signed)
Agree with Clobetasol cream 0.05% twice a week.

## 2018-07-05 NOTE — Telephone Encounter (Signed)
Patient aware, Rx sent.  

## 2018-07-06 ENCOUNTER — Telehealth: Payer: Self-pay | Admitting: *Deleted

## 2018-07-06 NOTE — Telephone Encounter (Signed)
Prior authorization for clobetasol cream done via cover my meds, will wait for response.

## 2018-07-07 ENCOUNTER — Encounter: Payer: Self-pay | Admitting: Internal Medicine

## 2018-07-07 MED ORDER — BETAMETHASONE DIPROPIONATE 0.05 % EX OINT: TOPICAL_OINTMENT | Freq: Two times a day (BID) | CUTANEOUS | 0 refills | Status: DC

## 2018-07-07 NOTE — Telephone Encounter (Signed)
Yes, agree with augmented Betamethasone 0.05% ointment.

## 2018-07-07 NOTE — Telephone Encounter (Signed)
Clobetasol cream has been denied by [facility] patient will need to try alternative medication such as augmented betamethasone 0.05% ointment. Is this medication an option?

## 2018-07-07 NOTE — Telephone Encounter (Signed)
I called and left message on pt voicemail new Rx sent.

## 2018-07-07 NOTE — Telephone Encounter (Signed)
Documented flu shot.../LMB  

## 2018-07-13 DIAGNOSIS — F900 Attention-deficit hyperactivity disorder, predominantly inattentive type: Secondary | ICD-10-CM | POA: Diagnosis not present

## 2018-08-05 ENCOUNTER — Other Ambulatory Visit: Payer: Self-pay | Admitting: Internal Medicine

## 2018-08-25 NOTE — Patient Instructions (Addendum)
  Tests ordered today. Your results will be released to [place] (or called to you) after review, usually within 72hours after test completion. If any changes need to be made, you will be notified at that same time.  Medications reviewed and updated.  Changes include :   Increase metoprolol to 50 mg twice daily.   Your prescription(s) have been submitted to your pharmacy. Please take as directed and contact our office if you believe you are having problem(s) with the medication(s).   Please followup in 6 months

## 2018-08-25 NOTE — Progress Notes (Signed)
Subjective:    Patient ID: Desiree Andrews, female    DOB: April 05, 1956, 63 y.o.   MRN: [id number]  HPI The patient is here for follow up.  Cold symptoms:  It started 2 days ago.  She has a runny nose, nasal congestion and cough.  The cough is been dry.  She denies any fevers, chills, shortness of breath, wheeze, sinus pain.  She has been take cold eeze, alka seltzer cold plus.    Hypertension: She is taking her medication daily. She is compliant with a low sodium diet.  She denies chest pain, palpitations, edema, shortness of breath and regular headaches. She is not exercising regularly.  She does not monitor her blood pressure at home.    Dyslipidemia: She is taking her medication daily - tricor. She is compliant with a low fat/cholesterol diet. She is not exercising regularly. She denies myalgias.   Prediabetes:  She is not compliant with a low sugar/carbohydrate diet.  She is not exercising regularly.  Hepatitis C test positive: She had a positive hepatitis C test few years ago.  She has never had any detectable virus load.  She denies GI symptoms.  Vitamin d def:  She is taking vitamin d daily.    Medications and allergies reviewed with patient and updated if appropriate.  Patient Active Problem List   Diagnosis Date Noted  . Lichen sclerosus 57/08/7791  . Prediabetes 02/17/2017  . Obese 02/17/2017  . 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 ANXIETY 08/06/2010  . 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 PARESTHESIA, HANDS 05/20/2009  . Vitamin D deficiency 12/18/2008  . Osteopenia 12/18/2008  . BREAST CANCER, HX OF 12/18/2008  . Dyslipidemia 10/19/2007  . Hepatitis C antibody test positive 10/19/2007  . Essential hypertension 06/07/2007    Current Outpatient Medications on File Prior to Visit  Medication Sig Dispense Refill  . amLODipine (NORVASC) 2.5 MG tablet Take 1 tablet (2.5 mg total) by mouth daily. 90 tablet 3  . amphetamine-dextroamphetamine  (ADDERALL) 20 MG tablet TK 1 T PO BID  0  . atorvastatin (LIPITOR) 10 MG tablet TAKE 1 TABLET(10 MG) BY MOUTH DAILY 90 tablet 1  . betamethasone dipropionate (DIPROLENE) 0.05 % ointment Apply topically 2 (two) times daily. 30 g 0  . cetirizine (ZYRTEC) 10 MG tablet Take 10 mg by mouth as needed.    . Cholecalciferol (RA VITAMIN D-3) 1000 UNITS tablet Take 1 tablet (1,000 Units total) by mouth daily.    . fenofibrate (TRICOR) 145 MG tablet Take 1 tablet (145 mg total) by mouth daily. 30 tablet 5  . metoprolol tartrate (LOPRESSOR) 25 MG tablet TAKE 1 TABLET(25 MG) BY MOUTH TWICE DAILY 180 tablet 1  . metoprolol tartrate (LOPRESSOR) 50 MG tablet Take 1 tablet (50 mg total) by mouth 2 (two) times daily. 180 tablet 3   No current facility-administered medications on file prior to visit.     Past Medical History:  Diagnosis Date  . Allergy   . Anxiety   . Cancer ([place])    breast-left   . Cardiopulmonary arrest ([place]) 1991   Postpartum,? Pulmonary thromboembolism  . Depression    off cymbalta now  . Hyperlipidemia   . Hypertension     Past Surgical History:  Procedure Laterality Date  . BREAST LUMPECTOMY  2003   snbx-Left, Dr.Rubin; Radiation & Chemotherapy X3  . BREAST LUMPECTOMY WITH NEEDLE LOCALIZATION Right 11/30/2012   Procedure: BREAST LUMPECTOMY WITH NEEDLE LOCALIZATION;  Surgeon: Marcello Moores A. Cornett,  MD;  Location: [place];  Service: General;  Laterality: Right;  needle localization at [place] 8:00   . CARDIOVASCULAR STRESS TEST  2005   Neg  . COLONOSCOPY  2006   Normal  . WRIST SURGERY  2012   Left, Dr.Rowen    Social History   Socioeconomic History  . Marital status: Divorced    Spouse name: Not on file  . Number of children: Not on file  . Years of education: Not on file  . Highest education level: Not on file  Occupational History  . Not on file  Social Needs  . Financial resource strain: Not on file  . Food insecurity:    Worry: Not  on file    Inability: Not on file  . Transportation needs:    Medical: Not on file    Non-medical: Not on file  Tobacco Use  . Smoking status: Former Smoker    Last attempt to quit: 08/24/1990    Years since quitting: 28.0  . Smokeless tobacco: Never Used  . Tobacco comment: smoked [place], up to < 1 ppd  Substance and Sexual Activity  . Alcohol use: Yes    Alcohol/week: 14.0 standard drinks    Types: 14 Glasses of wine per week    Comment: Socially  . Drug use: No  . Sexual activity: Not on file  Lifestyle  . Physical activity:    Days per week: Not on file    Minutes per session: Not on file  . Stress: Not on file  Relationships  . Social connections:    Talks on phone: Not on file    Gets together: Not on file    Attends religious service: Not on file    Active member of club or organization: Not on file    Attends meetings of clubs or organizations: Not on file    Relationship status: Not on file  Other Topics Concern  . Not on file  Social History Narrative  . Not on file    Family History  Problem Relation Age of Onset  . Diabetes Mother   . Hypertension Mother   . Hyperlipidemia Mother   . Stroke Mother 78       02/2009  . Heart attack Maternal Grandmother        in her 59s  . Stomach cancer Maternal Grandfather 70  . Colon cancer Neg Hx   . Rectal cancer Neg Hx   . Colon polyps Neg Hx   . Esophageal cancer Neg Hx     Review of Systems  Constitutional: Negative for chills and fever.  HENT: Positive for congestion and rhinorrhea. Negative for ear pain, sinus pressure, sinus pain and sore throat.   Respiratory: Positive for cough. Negative for shortness of breath and wheezing.   Cardiovascular: Negative for chest pain, palpitations and leg swelling.  Gastrointestinal: Negative for abdominal pain, constipation, diarrhea and nausea.  Musculoskeletal: Negative for myalgias.  Neurological: Negative for dizziness, light-headedness and headaches.         Objective:   Vitals:   08/26/18 0800  BP: (!) 148/84  Pulse: 92  Resp: 16  Temp: 98 F (36.7 C)  SpO2: 96%   BP Readings from Last 3 Encounters:  08/26/18 (!) 148/84  02/23/18 (!) 152/90  08/30/17 (!) 148/92   Wt Readings from Last 3 Encounters:  08/26/18 172 lb 6.4 oz (78.2 kg)  02/23/18 172 lb (78 kg)  08/30/17 171 lb (77.6  kg)   Body mass index is 30.54 kg/m.   Physical Exam    GENERAL APPEARANCE: Appears stated age, well appearing, NAD EYES: conjunctiva clear, no icterus HEENT: bilateral tympanic membranes and ear canals normal, oropharynx with no erythema, no thyromegaly, trachea midline, no cervical or supraclavicular lymphadenopathy LUNGS: Clear to auscultation without wheeze or crackles, unlabored breathing, good air entry bilaterally CARDIOVASCULAR: Normal S1,S2 without murmurs, no edema SKIN: Warm, dry        Assessment & Plan:    See Problem List for Assessment and Plan of chronic medical problems.

## 2018-08-25 NOTE — Assessment & Plan Note (Addendum)
No treatment in past ? Latent or cleared Recheck hepatitis C antibody with viral load

## 2018-08-26 ENCOUNTER — Encounter: Payer: Self-pay | Admitting: Internal Medicine

## 2018-08-26 ENCOUNTER — Other Ambulatory Visit (INDEPENDENT_AMBULATORY_CARE_PROVIDER_SITE_OTHER): Payer: BLUE CROSS/BLUE SHIELD

## 2018-08-26 ENCOUNTER — Ambulatory Visit: Payer: BLUE CROSS/BLUE SHIELD | Admitting: Internal Medicine

## 2018-08-26 VITALS — BP 148/84 | HR 92 | Temp 98.0°F | Resp 16 | Ht 63.0 in | Wt 172.4 lb

## 2018-08-26 DIAGNOSIS — E785 Hyperlipidemia, unspecified: Secondary | ICD-10-CM

## 2018-08-26 DIAGNOSIS — R7303 Prediabetes: Secondary | ICD-10-CM | POA: Diagnosis not present

## 2018-08-26 DIAGNOSIS — I10 Essential (primary) hypertension: Secondary | ICD-10-CM | POA: Diagnosis not present

## 2018-08-26 DIAGNOSIS — Z1159 Encounter for screening for other viral diseases: Secondary | ICD-10-CM | POA: Diagnosis not present

## 2018-08-26 DIAGNOSIS — R768 Other specified abnormal immunological findings in serum: Secondary | ICD-10-CM | POA: Diagnosis not present

## 2018-08-26 DIAGNOSIS — E559 Vitamin D deficiency, unspecified: Secondary | ICD-10-CM | POA: Diagnosis not present

## 2018-08-26 DIAGNOSIS — J069 Acute upper respiratory infection, unspecified: Secondary | ICD-10-CM

## 2018-08-26 LAB — LDL CHOLESTEROL, DIRECT: Direct LDL: 146 mg/dL

## 2018-08-26 LAB — COMPREHENSIVE METABOLIC PANEL
ALT: 26 U/L (ref 0–35)
AST: 27 U/L (ref 0–37)
Albumin: 4.5 g/dL (ref 3.5–5.2)
Alkaline Phosphatase: 56 U/L (ref 39–117)
BUN: 14 mg/dL (ref 6–23)
CO2: 23 mEq/L (ref 19–32)
Calcium: 9.9 mg/dL (ref 8.4–10.5)
Chloride: 102 mEq/L (ref 96–112)
Creatinine, Ser: 0.71 mg/dL (ref 0.40–1.20)
GFR: 87.99 mL/min (ref >60.00)
Glucose, Bld: 113 mg/dL — ABNORMAL HIGH (ref 70–99)
Potassium: 3.8 mEq/L (ref 3.5–5.1)
Sodium: 138 mEq/L (ref 135–145)
Total Bilirubin: 0.4 mg/dL (ref 0.2–1.2)
Total Protein: 7.2 g/dL (ref 6.0–8.3)

## 2018-08-26 LAB — LIPID PANEL
Cholesterol: 265 mg/dL — ABNORMAL HIGH (ref 0–200)
HDL: 48 mg/dL (ref >39.00)
Total CHOL/HDL Ratio: 6
Triglycerides: 496 mg/dL — ABNORMAL HIGH (ref 0.0–149.0)

## 2018-08-26 LAB — HEMOGLOBIN A1C: Hgb A1c MFr Bld: 6.2 % (ref 4.6–6.5)

## 2018-08-26 MED ORDER — METOPROLOL TARTRATE 50 MG PO TABS: 50.0000 mg | ORAL_TABLET | Freq: Two times a day (BID) | ORAL | 3 refills | Status: DC

## 2018-08-26 NOTE — Assessment & Plan Note (Signed)
Blood pressure slightly elevated here today Recommended taking her blood pressure at home After reviewing medication she has been taking metoprolol 25 mg twice daily instead of the 50 mg twice daily-increase back to 50 mg twice daily Continue amlodipine 2.5 mg daily Advised her to let me know if her blood pressure is not better controlled at home Start regular exercise, work on weight loss CMP

## 2018-08-26 NOTE — Assessment & Plan Note (Signed)
Check lipid panel, CMP Continue atorvastatin, fenofibrate Stressed the importance of regular exercise and weight loss Low-fat/cholesterol diet

## 2018-08-26 NOTE — Assessment & Plan Note (Signed)
Symptoms likely viral in nature Continue symptomatic treatment with over-the-counter cold medications, Tylenol/ibuprofen Increase rest and fluids Call if symptoms worsen or do not improve 

## 2018-08-26 NOTE — Assessment & Plan Note (Signed)
Taking vitamin D daily

## 2018-08-26 NOTE — Assessment & Plan Note (Signed)
Check A1c Encouraged regular exercise Advised weight loss Low sugar/carbohydrate diet

## 2018-09-01 ENCOUNTER — Encounter: Payer: Self-pay | Admitting: Internal Medicine

## 2018-09-01 LAB — HEPATITIS C ANTIBODY
Hepatitis C Ab: REACTIVE — AB
SIGNAL TO CUT-OFF: 17.8 — ABNORMAL HIGH (ref <1.00)

## 2018-09-01 LAB — HCV RNA,QUANTITATIVE REAL TIME PCR
HCV Quantitative Log: <1.18 Log IU/mL
HCV RNA, PCR, QN: NOT DETECTED [IU]/mL

## 2018-09-20 ENCOUNTER — Encounter: Payer: Self-pay | Admitting: Internal Medicine

## 2018-09-20 DIAGNOSIS — Z1159 Encounter for screening for other viral diseases: Secondary | ICD-10-CM | POA: Insufficient documentation

## 2018-09-23 ENCOUNTER — Other Ambulatory Visit: Payer: Self-pay | Admitting: Internal Medicine

## 2018-09-23 DIAGNOSIS — N6489 Other specified disorders of breast: Secondary | ICD-10-CM

## 2018-10-06 ENCOUNTER — Ambulatory Visit: Payer: BLUE CROSS/BLUE SHIELD

## 2018-10-06 ENCOUNTER — Ambulatory Visit
Admission: RE | Admit: 2018-10-06 | Discharge: 2018-10-06 | Disposition: A | Discharge status: Home / Self Care | Payer: BLUE CROSS/BLUE SHIELD | Source: Ambulatory Visit | Attending: Internal Medicine | Admitting: Internal Medicine

## 2018-10-06 DIAGNOSIS — R928 Other abnormal and inconclusive findings on diagnostic imaging of breast: Secondary | ICD-10-CM | POA: Diagnosis not present

## 2018-10-06 DIAGNOSIS — N6489 Other specified disorders of breast: Secondary | ICD-10-CM

## 2019-01-04 DIAGNOSIS — F900 Attention-deficit hyperactivity disorder, predominantly inattentive type: Secondary | ICD-10-CM | POA: Diagnosis not present

## 2019-01-11 DIAGNOSIS — E7800 Pure hypercholesterolemia, unspecified: Secondary | ICD-10-CM | POA: Diagnosis not present

## 2019-01-11 DIAGNOSIS — I10 Essential (primary) hypertension: Secondary | ICD-10-CM | POA: Diagnosis not present

## 2019-01-11 DIAGNOSIS — L03116 Cellulitis of left lower limb: Secondary | ICD-10-CM | POA: Diagnosis not present

## 2019-01-12 DIAGNOSIS — D229 Melanocytic nevi, unspecified: Secondary | ICD-10-CM | POA: Diagnosis not present

## 2019-01-12 DIAGNOSIS — L309 Dermatitis, unspecified: Secondary | ICD-10-CM | POA: Diagnosis not present

## 2019-01-12 DIAGNOSIS — L821 Other seborrheic keratosis: Secondary | ICD-10-CM | POA: Diagnosis not present

## 2019-02-26 NOTE — Patient Instructions (Signed)
Tests ordered today. Your results will be released to [place] (or called to you) after review, usually within 72hours after test completion. If any changes need to be made, you will be notified at that same time.  All other Health Maintenance issues reviewed.   All recommended immunizations and age-appropriate screenings are up-to-date or discussed.  No immunizations administered today.   Medications reviewed and updated.  Changes include :     Your prescription(s) have been submitted to your pharmacy. Please take as directed and contact our office if you believe you are having problem(s) with the medication(s).  A referral was ordered for   Please followup in 6 months    Health Maintenance, Female Adopting a healthy lifestyle and getting preventive care are important in promoting health and wellness. Ask your health care provider about:  The right schedule for you to have regular tests and exams.  Things you can do on your own to prevent diseases and keep yourself healthy. What should I know about diet, weight, and exercise? Eat a healthy diet   Eat a diet that includes plenty of vegetables, fruits, low-fat dairy products, and lean protein.  Do not eat a lot of foods that are high in solid fats, added sugars, or sodium. Maintain a healthy weight Body mass index (BMI) is used to identify weight problems. It estimates body fat based on height and weight. Your health care provider can help determine your BMI and help you achieve or maintain a healthy weight. Get regular exercise Get regular exercise. This is one of the most important things you can do for your health. Most adults should:  Exercise for at least 150 minutes each week. The exercise should increase your heart rate and make you sweat (moderate-intensity exercise).  Do strengthening exercises at least twice a week. This is in addition to the moderate-intensity exercise.  Spend less time sitting. Even light physical  activity can be beneficial. Watch cholesterol and blood lipids Have your blood tested for lipids and cholesterol at 65 years of age, then have this test every 5 years. Have your cholesterol levels checked more often if:  Your lipid or cholesterol levels are high.  You are older than 65 years of age.  You are at high risk for heart disease. What should I know about cancer screening? Depending on your health history and family history, you may need to have cancer screening at various ages. This may include screening for:  Breast cancer.  Cervical cancer.  Colorectal cancer.  Skin cancer.  Lung cancer. What should I know about heart disease, diabetes, and high blood pressure? Blood pressure and heart disease  High blood pressure causes heart disease and increases the risk of stroke. This is more likely to develop in people who have high blood pressure readings, are of African descent, or are overweight.  Have your blood pressure checked: ? Every 3-5 years if you are 20-62 years of age. ? Every year if you are 87 years old or older. Diabetes Have regular diabetes screenings. This checks your fasting blood sugar level. Have the screening done:  Once every three years after age 79 if you are at a normal weight and have a low risk for diabetes.  More often and at a younger age if you are overweight or have a high risk for diabetes. What should I know about preventing infection? Hepatitis B If you have a higher risk for hepatitis B, you should be screened for this virus. Talk with your  health care provider to find out if you are at risk for hepatitis B infection. Hepatitis C Testing is recommended for:  Everyone born from [birth year] through 1967.  Anyone with known risk factors for hepatitis C. Sexually transmitted infections (STIs)  Get screened for STIs, including gonorrhea and chlamydia, if: ? You are sexually active and are younger than 65 years of age. ? You are older than 65  years of age and your health care provider tells you that you are at risk for this type of infection. ? Your sexual activity has changed since you were last screened, and you are at increased risk for chlamydia or gonorrhea. Ask your health care provider if you are at risk.  Ask your health care provider about whether you are at high risk for HIV. Your health care provider may recommend a prescription medicine to help prevent HIV infection. If you choose to take medicine to prevent HIV, you should first get tested for HIV. You should then be tested every 3 months for as long as you are taking the medicine. Pregnancy  If you are about to stop having your period (premenopausal) and you may become pregnant, seek counseling before you get pregnant.  Take 400 to 800 micrograms (mcg) of folic acid every day if you become pregnant.  Ask for birth control (contraception) if you want to prevent pregnancy. Osteoporosis and menopause Osteoporosis is a disease in which the bones lose minerals and strength with aging. This can result in bone fractures. If you are 20 years old or older, or if you are at risk for osteoporosis and fractures, ask your health care provider if you should:  Be screened for bone loss.  Take a calcium or vitamin D supplement to lower your risk of fractures.  Be given hormone replacement therapy (HRT) to treat symptoms of menopause. Follow these instructions at home: Lifestyle  Do not use any products that contain nicotine or tobacco, such as cigarettes, e-cigarettes, and chewing tobacco. If you need help quitting, ask your health care provider.  Do not use street drugs.  Do not share needles.  Ask your health care provider for help if you need support or information about quitting drugs. Alcohol use  Do not drink alcohol if: ? Your health care provider tells you not to drink. ? You are pregnant, may be pregnant, or are planning to become pregnant.  If you drink alcohol:  ? Limit how much you use to 0-1 drink a day. ? Limit intake if you are breastfeeding.  Be aware of how much alcohol is in your drink. In the [place] one drink equals one 12 oz bottle of beer (355 mL), one 5 oz glass of wine (148 mL), or one 1 oz glass of hard liquor (44 mL). General instructions  Schedule regular health, dental, and eye exams.  Stay current with your vaccines.  Tell your health care provider if: ? You often feel depressed. ? You have ever been abused or do not feel safe at home. Summary  Adopting a healthy lifestyle and getting preventive care are important in promoting health and wellness.  Follow your health care provider's instructions about healthy diet, exercising, and getting tested or screened for diseases.  Follow your health care provider's instructions on monitoring your cholesterol and blood pressure. This information is not intended to replace advice given to you by your health care provider. Make sure you discuss any questions you have with your health care provider. Document Released: 02/23/2011 Document Revised:  08/03/2018 Document Reviewed: 08/03/2018 Elsevier Patient Education  [place].

## 2019-02-26 NOTE — Progress Notes (Signed)
Subjective:    Patient ID: Desiree Andrews, female    DOB: 10/12/1955, 63 y.o.   MRN: [id number]  HPI She is here for a physical exam.     Medications and allergies reviewed with patient and updated if appropriate.  Patient Active Problem List   Diagnosis Date Noted  . Need for hepatitis C screening test 09/20/2018  . Viral upper respiratory tract infection 08/26/2018  . Lichen sclerosus 83/15/1761  . Prediabetes 02/17/2017  . Obese 02/17/2017  . 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 ANXIETY 08/06/2010  . 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 PARESTHESIA, HANDS 05/20/2009  . Vitamin D deficiency 12/18/2008  . Osteopenia 12/18/2008  . BREAST CANCER, HX OF 12/18/2008  . Dyslipidemia 10/19/2007  . Hepatitis C antibody test positive 10/19/2007  . Essential hypertension 06/07/2007    Current Outpatient Medications on File Prior to Visit  Medication Sig Dispense Refill  . amLODipine (NORVASC) 2.5 MG tablet Take 1 tablet (2.5 mg total) by mouth daily. 90 tablet 3  . amphetamine-dextroamphetamine (ADDERALL) 20 MG tablet TK 1 T PO BID  0  . atorvastatin (LIPITOR) 10 MG tablet TAKE 1 TABLET(10 MG) BY MOUTH DAILY 90 tablet 1  . betamethasone dipropionate (DIPROLENE) 0.05 % ointment Apply topically 2 (two) times daily. 30 g 0  . cetirizine (ZYRTEC) 10 MG tablet Take 10 mg by mouth as needed.    . Cholecalciferol (RA VITAMIN D-3) 1000 UNITS tablet Take 1 tablet (1,000 Units total) by mouth daily.    . fenofibrate (TRICOR) 145 MG tablet Take 1 tablet (145 mg total) by mouth daily. 30 tablet 5  . metoprolol tartrate (LOPRESSOR) 50 MG tablet Take 1 tablet (50 mg total) by mouth 2 (two) times daily. 180 tablet 3   No current facility-administered medications on file prior to visit.     Past Medical History:  Diagnosis Date  . Allergy   . Anxiety   . Cancer ([place])    breast-left   . Cardiopulmonary arrest ([place]) 1991   Postpartum,? Pulmonary thromboembolism   . Depression    off cymbalta now  . Hyperlipidemia   . Hypertension     Past Surgical History:  Procedure Laterality Date  . BREAST LUMPECTOMY  2003   snbx-Left, Dr.Rubin; Radiation & Chemotherapy X3  . BREAST LUMPECTOMY WITH NEEDLE LOCALIZATION Right 11/30/2012   Procedure: BREAST LUMPECTOMY WITH NEEDLE LOCALIZATION;  Surgeon: Marcello Moores A. Cornett, MD;  Location: [facility];  Service: General;  Laterality: Right;  needle localization at [place] 8:00   . CARDIOVASCULAR STRESS TEST  2005   Neg  . COLONOSCOPY  2006   Normal  . WRIST SURGERY  2012   Left, Dr.Rowen    Social History   Socioeconomic History  . Marital status: Divorced    Spouse name: Not on file  . Number of children: Not on file  . Years of education: Not on file  . Highest education level: Not on file  Occupational History  . Not on file  Social Needs  . Financial resource strain: Not on file  . Food insecurity    Worry: Not on file    Inability: Not on file  . Transportation needs    Medical: Not on file    Non-medical: Not on file  Tobacco Use  . Smoking status: Former Smoker    Quit date: 08/24/1990    Years since quitting: 28.5  . Smokeless tobacco: Never Used  .  Tobacco comment: smoked [place], up to < 1 ppd  Substance and Sexual Activity  . Alcohol use: Yes    Alcohol/week: 14.0 standard drinks    Types: 14 Glasses of wine per week    Comment: Socially  . Drug use: No  . Sexual activity: Not on file  Lifestyle  . Physical activity    Days per week: Not on file    Minutes per session: Not on file  . Stress: Not on file  Relationships  . Social Herbalist on phone: Not on file    Gets together: Not on file    Attends religious service: Not on file    Active member of club or organization: Not on file    Attends meetings of clubs or organizations: Not on file    Relationship status: Not on file  Other Topics Concern  . Not on file  Social History  Narrative  . Not on file    Family History  Problem Relation Age of Onset  . Diabetes Mother   . Hypertension Mother   . Hyperlipidemia Mother   . Stroke Mother 80       02/2009  . Heart attack Maternal Grandmother        in her 47s  . Stomach cancer Maternal Grandfather 45  . Colon cancer Neg Hx   . Rectal cancer Neg Hx   . Colon polyps Neg Hx   . Esophageal cancer Neg Hx     Review of Systems     Objective:  There were no vitals filed for this visit. There were no vitals filed for this visit. There is no height or weight on file to calculate BMI.  BP Readings from Last 3 Encounters:  08/26/18 (!) 148/84  02/23/18 (!) 152/90  08/30/17 (!) 148/92    Wt Readings from Last 3 Encounters:  08/26/18 172 lb 6.4 oz (78.2 kg)  02/23/18 172 lb (78 kg)  08/30/17 171 lb (77.6 kg)     Physical Exam Constitutional: She appears well-developed and well-nourished. No distress.  HENT:  Head: Normocephalic and atraumatic.  Right Ear: External ear normal. Normal ear canal and TM Left Ear: External ear normal.  Normal ear canal and TM Mouth/Throat: Oropharynx is clear and moist.  Eyes: Conjunctivae and EOM are normal.  Neck: Neck supple. No tracheal deviation present. No thyromegaly present.  No carotid bruit  Cardiovascular: Normal rate, regular rhythm and normal heart sounds.   No murmur heard.  No edema. Pulmonary/Chest: Effort normal and breath sounds normal. No respiratory distress. She has no wheezes. She has no rales.  Breast: deferred   Abdominal: Soft. She exhibits no distension. There is no tenderness.  Lymphadenopathy: She has no cervical adenopathy.  Skin: Skin is warm and dry. She is not diaphoretic.  Psychiatric: She has a normal mood and affect. Her behavior is normal.        Assessment & Plan:   Physical exam: Screening blood work ordered Immunizations  All up to date Colonoscopy   Up to date  Mammogram   Up to date  Gyn     Up to date  Dexa   Due -  breast center    ????? Eye exams Exercise Weight Skin  Substance abuse    2 glasses of wine nightly  See Problem List for Assessment and Plan of chronic medical problems.        This encounter was created in error - please disregard.

## 2019-02-27 ENCOUNTER — Encounter: Payer: BLUE CROSS/BLUE SHIELD | Admitting: Internal Medicine

## 2019-02-27 ENCOUNTER — Telehealth: Payer: Self-pay | Admitting: Internal Medicine

## 2019-02-27 DIAGNOSIS — I10 Essential (primary) hypertension: Secondary | ICD-10-CM

## 2019-02-27 DIAGNOSIS — R7303 Prediabetes: Secondary | ICD-10-CM

## 2019-02-27 DIAGNOSIS — E785 Hyperlipidemia, unspecified: Secondary | ICD-10-CM

## 2019-02-27 NOTE — Telephone Encounter (Signed)
Blood work ordered.

## 2019-02-27 NOTE — Telephone Encounter (Signed)
Pt aware.

## 2019-02-27 NOTE — Telephone Encounter (Signed)
Patient called stating that she needed to reschedule her appointment that was scheduled for this morning. Her car would not run and she did not have a way to get here. Patient has rescheduled her appointment to 03/14/2019 and would like to have labs done prior to the visit. Can orders be put in? Please advise.

## 2019-03-02 ENCOUNTER — Other Ambulatory Visit (INDEPENDENT_AMBULATORY_CARE_PROVIDER_SITE_OTHER): Payer: BC Managed Care – PPO

## 2019-03-02 DIAGNOSIS — I10 Essential (primary) hypertension: Secondary | ICD-10-CM

## 2019-03-02 DIAGNOSIS — E785 Hyperlipidemia, unspecified: Secondary | ICD-10-CM | POA: Diagnosis not present

## 2019-03-02 DIAGNOSIS — R7303 Prediabetes: Secondary | ICD-10-CM | POA: Diagnosis not present

## 2019-03-02 LAB — LIPID PANEL
Cholesterol: 253 mg/dL — ABNORMAL HIGH (ref 0–200)
HDL: 44.3 mg/dL (ref >39.00)
Total CHOL/HDL Ratio: 6
Triglycerides: 503 mg/dL — ABNORMAL HIGH (ref 0.0–149.0)

## 2019-03-02 LAB — COMPREHENSIVE METABOLIC PANEL
ALT: 24 U/L (ref 0–35)
AST: 22 U/L (ref 0–37)
Albumin: 4.4 g/dL (ref 3.5–5.2)
Alkaline Phosphatase: 57 U/L (ref 39–117)
BUN: 13 mg/dL (ref 6–23)
CO2: 25 mEq/L (ref 19–32)
Calcium: 9.3 mg/dL (ref 8.4–10.5)
Chloride: 104 mEq/L (ref 96–112)
Creatinine, Ser: 0.74 mg/dL (ref 0.40–1.20)
GFR: 78.79 mL/min (ref >60.00)
Glucose, Bld: 104 mg/dL — ABNORMAL HIGH (ref 70–99)
Potassium: 4.4 mEq/L (ref 3.5–5.1)
Sodium: 140 mEq/L (ref 135–145)
Total Bilirubin: 0.4 mg/dL (ref 0.2–1.2)
Total Protein: 7 g/dL (ref 6.0–8.3)

## 2019-03-02 LAB — CBC WITH DIFFERENTIAL/PLATELET
Basophils Absolute: 0.1 10*3/uL (ref 0.0–0.1)
Basophils Relative: 1.2 % (ref 0.0–3.0)
Eosinophils Absolute: 0.3 10*3/uL (ref 0.0–0.7)
Eosinophils Relative: 3.9 % (ref 0.0–5.0)
HCT: 42.6 % (ref 36.0–46.0)
Hemoglobin: 14.5 g/dL (ref 12.0–15.0)
Lymphocytes Relative: 43.7 % (ref 12.0–46.0)
Lymphs Abs: 3.4 10*3/uL (ref 0.7–4.0)
MCHC: 33.9 g/dL (ref 30.0–36.0)
MCV: 94.8 fl (ref 78.0–100.0)
Monocytes Absolute: 0.7 10*3/uL (ref 0.1–1.0)
Monocytes Relative: 8.8 % (ref 3.0–12.0)
Neutro Abs: 3.3 10*3/uL (ref 1.4–7.7)
Neutrophils Relative %: 42.4 % — ABNORMAL LOW (ref 43.0–77.0)
Platelets: 256 10*3/uL (ref 150.0–400.0)
RBC: 4.5 Mil/uL (ref 3.87–5.11)
RDW: 13.1 % (ref 11.5–15.5)
WBC: 7.8 10*3/uL (ref 4.0–10.5)

## 2019-03-02 LAB — LDL CHOLESTEROL, DIRECT: Direct LDL: 120 mg/dL

## 2019-03-02 LAB — HEMOGLOBIN A1C: Hgb A1c MFr Bld: 6.2 % (ref 4.6–6.5)

## 2019-03-02 LAB — TSH: TSH: 1.07 u[IU]/mL (ref 0.35–4.50)

## 2019-03-13 NOTE — Progress Notes (Signed)
Subjective:    Patient ID: Desiree Andrews, female    DOB: May 25, 1956, 63 y.o.   MRN: [id number]  HPI She is here for a physical exam.   She takes her tricor maybe three or four times a week. She eats too much meat and hamburgers.  She eats too much carbs.  She drinks at least 2 glasses of alcohol at night.  She is not exercising.   She takes her BP medications daily.  Her BP is high at home.    She goes to bed at 10 and wakes at 6-6:30 and gets up 3-4 times.  Many things wake her up. She has been taking the trazodone and it does not help.  She has been on ambien in the past and wondered about trying that again.    Medications and allergies reviewed with patient and updated if appropriate.  Patient Active Problem List   Diagnosis Date Noted  . Lichen sclerosus 63/84/6659  . Prediabetes 02/17/2017  . Obese 02/17/2017  . 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 PARESTHESIA, HANDS 05/20/2009  . Vitamin D deficiency 12/18/2008  . Osteopenia 12/18/2008  . BREAST CANCER, HX OF 12/18/2008  . Dyslipidemia 10/19/2007  . Hepatitis C antibody test positive 10/19/2007  . Essential hypertension 06/07/2007    Current Outpatient Medications on File Prior to Visit  Medication Sig Dispense Refill  . amLODipine (NORVASC) 2.5 MG tablet Take 1 tablet (2.5 mg total) by mouth daily. 90 tablet 3  . amphetamine-dextroamphetamine (ADDERALL) 20 MG tablet TK 1 T PO BID  0  . atorvastatin (LIPITOR) 10 MG tablet TAKE 1 TABLET(10 MG) BY MOUTH DAILY 90 tablet 1  . betamethasone dipropionate (DIPROLENE) 0.05 % ointment Apply topically 2 (two) times daily. 30 g 0  . cetirizine (ZYRTEC) 10 MG tablet Take 10 mg by mouth as needed.    . cholecalciferol (VITAMIN D3) 25 MCG (1000 UT) tablet Take 1,000 Units by mouth daily.    . fenofibrate (TRICOR) 145 MG tablet Take 1 tablet (145 mg total) by mouth daily. 30 tablet 5  . metoprolol tartrate  (LOPRESSOR) 50 MG tablet Take 1 tablet (50 mg total) by mouth 2 (two) times daily. 180 tablet 3   No current facility-administered medications on file prior to visit.     Past Medical History:  Diagnosis Date  . Allergy   . Anxiety   . Cancer ([place])    breast-left   . Cardiopulmonary arrest ([place]) 1991   Postpartum,? Pulmonary thromboembolism  . Depression    off cymbalta now  . Hyperlipidemia   . Hypertension     Past Surgical History:  Procedure Laterality Date  . BREAST LUMPECTOMY  2003   snbx-Left, Dr.Rubin; Radiation & Chemotherapy X3  . BREAST LUMPECTOMY WITH NEEDLE LOCALIZATION Right 11/30/2012   Procedure: BREAST LUMPECTOMY WITH NEEDLE LOCALIZATION;  Surgeon: Marcello Moores A. Cornett, MD;  Location: [facility];  Service: General;  Laterality: Right;  needle localization at [place] 8:00   . CARDIOVASCULAR STRESS TEST  2005   Neg  . COLONOSCOPY  2006   Normal  . WRIST SURGERY  2012   Left, Dr.Rowen    Social History   Socioeconomic History  . Marital status: Divorced    Spouse name: Not on file  . Number of children: Not on file  . Years of education: Not on file  . Highest education level: Not on file  Occupational  History  . Not on file  Social Needs  . Financial resource strain: Not on file  . Food insecurity    Worry: Not on file    Inability: Not on file  . Transportation needs    Medical: Not on file    Non-medical: Not on file  Tobacco Use  . Smoking status: Former Smoker    Quit date: 08/24/1990    Years since quitting: 28.5  . Smokeless tobacco: Never Used  . Tobacco comment: smoked [place], up to < 1 ppd  Substance and Sexual Activity  . Alcohol use: Yes    Alcohol/week: 14.0 standard drinks    Types: 14 Glasses of wine per week    Comment: Socially  . Drug use: No  . Sexual activity: Not on file  Lifestyle  . Physical activity    Days per week: Not on file    Minutes per session: Not on file  . Stress: Not on file   Relationships  . Social Herbalist on phone: Not on file    Gets together: Not on file    Attends religious service: Not on file    Active member of club or organization: Not on file    Attends meetings of clubs or organizations: Not on file    Relationship status: Not on file  Other Topics Concern  . Not on file  Social History Narrative  . Not on file    Family History  Problem Relation Age of Onset  . Diabetes Mother   . Hypertension Mother   . Hyperlipidemia Mother   . Stroke Mother 52       02/2009  . Heart attack Maternal Grandmother        in her 25s  . Stomach cancer Maternal Grandfather 57  . Colon cancer Neg Hx   . Rectal cancer Neg Hx   . Colon polyps Neg Hx   . Esophageal cancer Neg Hx     Review of Systems  Constitutional: Negative for chills and fever.  Eyes: Negative for visual disturbance.  Respiratory: Negative for cough, shortness of breath and wheezing.   Cardiovascular: Negative for chest pain, palpitations and leg swelling.  Gastrointestinal: Positive for constipation. Negative for abdominal pain, blood in stool, diarrhea and nausea.       No gerd  Genitourinary: Negative for dysuria and hematuria.  Musculoskeletal: Negative for arthralgias and back pain.  Skin: Negative for color change and rash.  Neurological: Negative for light-headedness and headaches.  Psychiatric/Behavioral: Negative for dysphoric mood. The patient is not nervous/anxious.        Objective:   Vitals:   03/14/19 1510  BP: (!) 162/96  Pulse: 77  Resp: 16  Temp: 99.2 F (37.3 C)  SpO2: 96%   Filed Weights   03/14/19 1510  Weight: 178 lb (80.7 kg)   Body mass index is 31.53 kg/m.  BP Readings from Last 3 Encounters:  03/14/19 (!) 162/96  08/26/18 (!) 148/84  02/23/18 (!) 152/90    Wt Readings from Last 3 Encounters:  03/14/19 178 lb (80.7 kg)  08/26/18 172 lb 6.4 oz (78.2 kg)  02/23/18 172 lb (78 kg)     Physical Exam Constitutional: She  appears well-developed and well-nourished. No distress.  HENT:  Head: Normocephalic and atraumatic.  Right Ear: External ear normal. Normal ear canal and TM Left Ear: External ear normal.  Normal ear canal and TM Mouth/Throat: Oropharynx is clear and moist.  Eyes: Conjunctivae  and EOM are normal.  Neck: Neck supple. No tracheal deviation present. No thyromegaly present.  No carotid bruit  Cardiovascular: Normal rate, regular rhythm and normal heart sounds.   No murmur heard.  No edema. Pulmonary/Chest: Effort normal and breath sounds normal. No respiratory distress. She has no wheezes. She has no rales.  Breast: deferred   Abdominal: Soft. She exhibits no distension. There is no tenderness.  Lymphadenopathy: She has no cervical adenopathy.  Skin: Skin is warm and dry. She is not diaphoretic.  Psychiatric: She has a normal mood and affect. Her behavior is normal.        Assessment & Plan:   Physical exam: Screening blood work    reviewed Immunizations  All up to date Colonoscopy   Up to date  Mammogram   Up to date  Gyn     Up to date  Dexa   Due - breast center  - ordered - will get done in Feb 2021 Eye exams  No up to date Exercise     none Weight  Stressed weight loss -- has gained weight Skin  No concerns - sees dr [place] Substance abuse    At least 2 glasses of wine nightly  - advised cutting down  See Problem List for Assessment and Plan of chronic medical problems.   FU in 6 weeks

## 2019-03-14 ENCOUNTER — Ambulatory Visit (INDEPENDENT_AMBULATORY_CARE_PROVIDER_SITE_OTHER): Payer: BC Managed Care – PPO | Admitting: Internal Medicine

## 2019-03-14 ENCOUNTER — Other Ambulatory Visit: Payer: Self-pay | Admitting: Internal Medicine

## 2019-03-14 ENCOUNTER — Encounter: Payer: Self-pay | Admitting: Internal Medicine

## 2019-03-14 ENCOUNTER — Other Ambulatory Visit: Payer: Self-pay

## 2019-03-14 VITALS — BP 162/96 | HR 77 | Temp 99.2°F | Resp 16 | Ht 63.0 in | Wt 178.0 lb

## 2019-03-14 DIAGNOSIS — Z13820 Encounter for screening for osteoporosis: Secondary | ICD-10-CM | POA: Diagnosis not present

## 2019-03-14 DIAGNOSIS — F1010 Alcohol abuse, uncomplicated: Secondary | ICD-10-CM | POA: Insufficient documentation

## 2019-03-14 DIAGNOSIS — I10 Essential (primary) hypertension: Secondary | ICD-10-CM

## 2019-03-14 DIAGNOSIS — E6609 Other obesity due to excess calories: Secondary | ICD-10-CM

## 2019-03-14 DIAGNOSIS — R7303 Prediabetes: Secondary | ICD-10-CM | POA: Diagnosis not present

## 2019-03-14 DIAGNOSIS — E785 Hyperlipidemia, unspecified: Secondary | ICD-10-CM

## 2019-03-14 DIAGNOSIS — Z0000 Encounter for general adult medical examination without abnormal findings: Secondary | ICD-10-CM

## 2019-03-14 DIAGNOSIS — Z6831 Body mass index (BMI) 31.0-31.9, adult: Secondary | ICD-10-CM

## 2019-03-14 DIAGNOSIS — Z789 Other specified health status: Secondary | ICD-10-CM | POA: Insufficient documentation

## 2019-03-14 DIAGNOSIS — M8580 Other specified disorders of bone density and structure, unspecified site: Secondary | ICD-10-CM

## 2019-03-14 DIAGNOSIS — Z1231 Encounter for screening mammogram for malignant neoplasm of breast: Secondary | ICD-10-CM

## 2019-03-14 MED ORDER — METOPROLOL TARTRATE 50 MG PO TABS: 75.0000 mg | ORAL_TABLET | Freq: Two times a day (BID) | ORAL | 3 refills | Status: DC

## 2019-03-14 MED ORDER — AMLODIPINE BESYLATE 5 MG PO TABS: 5.0000 mg | ORAL_TABLET | Freq: Every day | ORAL | 0 refills | Status: DC

## 2019-03-14 NOTE — Assessment & Plan Note (Signed)
Not controlled Increase amlodipine to 5 mg daily Increase metoprolol to 75 mg twice daily Monitor BP at home F/u in 6 weeks

## 2019-03-14 NOTE — Assessment & Plan Note (Signed)
Drinking at least two glasses of wine at night Advised that she needs to cut down on how much she is drinking

## 2019-03-14 NOTE — Assessment & Plan Note (Signed)
Lab Results  Component Value Date   HGBA1C 6.2 03/02/2019    Stressed regular exercise Decrease wine intake and improve diet Work on weight loss

## 2019-03-14 NOTE — Assessment & Plan Note (Signed)
Not controlled Taking lipitor and tricor - not taking tricor daily Stressed taking medication daily Improve diet, decrease alcohol Work on [employer] loss Start regular exercise Will recheck in 6 weeks

## 2019-03-14 NOTE — Patient Instructions (Addendum)
  Your BP at home needs to be < 140/90.    All other Health Maintenance issues reviewed.   All recommended immunizations and age-appropriate screenings are up-to-date or discussed.  No immunization administered today.   Medications reviewed and updated.  Changes include :   Try trazodone two pills at night.  Send me a note via mychart if this is not effective.    Take tricor daily.    Increase metoprolol to 75mg  twice daily.  Increase amlodipine to 2.5 mg twice daily.    Please followup in 4-6 weeks

## 2019-03-14 NOTE — Assessment & Plan Note (Signed)
With prediabetes, htn, hichol Improve diet, dec alcohol, exercise Stressed exercise

## 2019-03-14 NOTE — Assessment & Plan Note (Signed)
Due for dexa - ordered -- will schedule for 09/2019 Encouraged regular exercise

## 2019-03-21 ENCOUNTER — Other Ambulatory Visit: Payer: Self-pay | Admitting: Internal Medicine

## 2019-03-31 DIAGNOSIS — M79671 Pain in right foot: Secondary | ICD-10-CM | POA: Diagnosis not present

## 2019-03-31 DIAGNOSIS — S93601A Unspecified sprain of right foot, initial encounter: Secondary | ICD-10-CM | POA: Diagnosis not present

## 2019-03-31 DIAGNOSIS — Z1159 Encounter for screening for other viral diseases: Secondary | ICD-10-CM | POA: Diagnosis not present

## 2019-04-09 NOTE — Progress Notes (Signed)
Subjective:    Patient ID: Desiree Andrews, female    DOB: June 05, 1956, 63 y.o.   MRN: [id number]  HPI The patient is here for follow up.  She is not exercising regularly.     Hypertension: We increased her medication about one month ago.  She is taking her medication daily. She is compliant with a low sodium diet.  She denies chest pain, palpitations, edema, shortness of breath and regular headaches. She does not monitor her blood pressure at home.    Hyperlipidemia: She was not taking her tricor daily, but has been doing so for a few weeks.  I advised she decrease her wine intake, but she has not.  She is taking her medication daily. She is compliant with a low fat/cholesterol diet. She denies myalgias.  Prediabetes: She is trying to eat a low sugar/carbohydrate diet.  She is not currently exercising regularly.  She can assumes a least 2 glasses of wine a night.  Two weeks ago she fell and her right foot went into the door jam.  She did go and have it evaluated and there is no fracture.  Her foot is still swollen although it has improved.  The swelling worsens throughout the day and does go down overnight.  The bruising has improved significantly.  She is not able to fit real shoes on and is only been able to wear flip-flops.  Medications and allergies reviewed with patient and updated if appropriate.  Patient Active Problem List   Diagnosis Date Noted  . Alcohol abuse 03/14/2019  . Lichen sclerosus 59/74/1638  . Prediabetes 02/17/2017  . Obese 02/17/2017  . 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 PARESTHESIA, HANDS 05/20/2009  . Vitamin D deficiency 12/18/2008  . Osteopenia 12/18/2008  . BREAST CANCER, HX OF 12/18/2008  . Dyslipidemia 10/19/2007  . Hepatitis C antibody test positive 10/19/2007  . Essential hypertension 06/07/2007    Current Outpatient Medications on File Prior to Visit  Medication Sig Dispense  Refill  . amLODipine (NORVASC) 5 MG tablet Take 1 tablet (5 mg total) by mouth daily. 90 tablet 0  . amphetamine-dextroamphetamine (ADDERALL) 20 MG tablet TK 1 T PO BID  0  . atorvastatin (LIPITOR) 10 MG tablet TAKE 1 TABLET(10 MG) BY MOUTH DAILY 90 tablet 1  . betamethasone dipropionate (DIPROLENE) 0.05 % ointment Apply topically 2 (two) times daily. 30 g 0  . cetirizine (ZYRTEC) 10 MG tablet Take 10 mg by mouth as needed.    . cholecalciferol (VITAMIN D3) 25 MCG (1000 UT) tablet Take 1,000 Units by mouth daily.    . fenofibrate (TRICOR) 145 MG tablet TAKE 1 TABLET(145 MG) BY MOUTH DAILY 90 tablet 1  . metoprolol tartrate (LOPRESSOR) 50 MG tablet Take 1.5 tablets (75 mg total) by mouth 2 (two) times daily. 270 tablet 3  . traZODone (DESYREL) 50 MG tablet Take 100 mg by mouth Nightly.     No current facility-administered medications on file prior to visit.     Past Medical History:  Diagnosis Date  . Allergy   . Anxiety   . Cancer ([place])    breast-left   . Cardiopulmonary arrest ([place]) 1991   Postpartum,? Pulmonary thromboembolism  . Depression    off cymbalta now  . Hyperlipidemia   . Hypertension     Past Surgical History:  Procedure Laterality Date  . BREAST LUMPECTOMY  2003   snbx-Left, Dr.Rubin; Radiation & Chemotherapy X3  .  BREAST LUMPECTOMY WITH NEEDLE LOCALIZATION Right 11/30/2012   Procedure: BREAST LUMPECTOMY WITH NEEDLE LOCALIZATION;  Surgeon: Marcello Moores A. Cornett, MD;  Location: [facility];  Service: General;  Laterality: Right;  needle localization at [place] 8:00   . CARDIOVASCULAR STRESS TEST  2005   Neg  . COLONOSCOPY  2006   Normal  . WRIST SURGERY  2012   Left, Dr.Rowen    Social History   Socioeconomic History  . Marital status: Divorced    Spouse name: Not on file  . Number of children: Not on file  . Years of education: Not on file  . Highest education level: Not on file  Occupational History  . Not on file  Social Needs   . Financial resource strain: Not on file  . Food insecurity    Worry: Not on file    Inability: Not on file  . Transportation needs    Medical: Not on file    Non-medical: Not on file  Tobacco Use  . Smoking status: Former Smoker    Quit date: 08/24/1990    Years since quitting: 28.6  . Smokeless tobacco: Never Used  . Tobacco comment: smoked [place], up to < 1 ppd  Substance and Sexual Activity  . Alcohol use: Yes    Alcohol/week: 14.0 standard drinks    Types: 14 Glasses of wine per week    Comment: Socially  . Drug use: No  . Sexual activity: Not on file  Lifestyle  . Physical activity    Days per week: Not on file    Minutes per session: Not on file  . Stress: Not on file  Relationships  . Social Herbalist on phone: Not on file    Gets together: Not on file    Attends religious service: Not on file    Active member of club or organization: Not on file    Attends meetings of clubs or organizations: Not on file    Relationship status: Not on file  Other Topics Concern  . Not on file  Social History Narrative  . Not on file    Family History  Problem Relation Age of Onset  . Diabetes Mother   . Hypertension Mother   . Hyperlipidemia Mother   . Stroke Mother 40       02/2009  . Heart attack Maternal Grandmother        in her 12s  . Stomach cancer Maternal Grandfather 86  . Colon cancer Neg Hx   . Rectal cancer Neg Hx   . Colon polyps Neg Hx   . Esophageal cancer Neg Hx     Review of Systems  Constitutional: Negative for fever.  Respiratory: Negative for cough, shortness of breath and wheezing.   Cardiovascular: Negative for chest pain and leg swelling.  Neurological: Negative for dizziness, light-headedness and headaches.       Objective:   Vitals:   04/11/19 0749  BP: 130/68  Pulse: 86  Resp: 18  Temp: 97.7 F (36.5 C)  SpO2: 99%   BP Readings from Last 3 Encounters:  04/11/19 130/68  03/14/19 (!) 162/96  08/26/18 (!) 148/84    Wt Readings from Last 3 Encounters:  04/11/19 178 lb (80.7 kg)  03/14/19 178 lb (80.7 kg)  08/26/18 172 lb 6.4 oz (78.2 kg)   Body mass index is 31.53 kg/m.   Physical Exam    Constitutional: Appears well-developed and well-nourished. No distress.  HENT:  Head: Normocephalic and atraumatic.  Neck: Neck supple. No tracheal deviation present. No thyromegaly present.  No cervical lymphadenopathy Cardiovascular: Normal rate, regular rhythm and normal heart sounds.   No murmur heard. No carotid bruit .  No ankle edema.  Right foot is swollen 1+ pitting edema from injury 2 weeks ago Pulmonary/Chest: Effort normal and breath sounds normal. No respiratory distress. No has no wheezes. No rales.  Skin: Skin is warm and dry. Not diaphoretic.  Psychiatric: Normal mood and affect. Behavior is normal.      Assessment & Plan:    See Problem List for Assessment and Plan of chronic medical problems.

## 2019-04-09 NOTE — Patient Instructions (Addendum)
  Medications reviewed and updated.  Changes include :   none    Please followup in 6 months   

## 2019-04-11 ENCOUNTER — Telehealth: Payer: Self-pay

## 2019-04-11 ENCOUNTER — Other Ambulatory Visit: Payer: Self-pay

## 2019-04-11 ENCOUNTER — Ambulatory Visit (INDEPENDENT_AMBULATORY_CARE_PROVIDER_SITE_OTHER): Payer: Medicare Other | Admitting: Internal Medicine

## 2019-04-11 ENCOUNTER — Encounter: Payer: Self-pay | Admitting: Internal Medicine

## 2019-04-11 VITALS — BP 130/68 | HR 86 | Temp 97.7°F | Resp 18 | Ht 63.0 in | Wt 178.0 lb

## 2019-04-11 DIAGNOSIS — R7303 Prediabetes: Secondary | ICD-10-CM

## 2019-04-11 DIAGNOSIS — E785 Hyperlipidemia, unspecified: Secondary | ICD-10-CM | POA: Diagnosis not present

## 2019-04-11 DIAGNOSIS — M25471 Effusion, right ankle: Secondary | ICD-10-CM | POA: Insufficient documentation

## 2019-04-11 DIAGNOSIS — M7989 Other specified soft tissue disorders: Secondary | ICD-10-CM

## 2019-04-11 DIAGNOSIS — I10 Essential (primary) hypertension: Secondary | ICD-10-CM | POA: Diagnosis not present

## 2019-04-11 NOTE — Assessment & Plan Note (Signed)
Right foot-injured it 2 weeks ago when she fell and it went into the door jam Swelling has improved, but still has significant swelling-gets worse throughout the day Advised her to elevate it even if she is sitting at her desk Can wear a tight sock on the foot to see if that helps prevent worsening swelling throughout the day

## 2019-04-11 NOTE — Assessment & Plan Note (Signed)
BP improved Continue current medications at current doses FU in 6 months

## 2019-04-11 NOTE — Telephone Encounter (Signed)
LVM letting pt know.  

## 2019-04-11 NOTE — Assessment & Plan Note (Signed)
Lab Results  Component Value Date   HGBA1C 6.2 03/02/2019   Sugars well controlled and stable in the prediabetic range Encouraged regular activity-she is currently not exercising Advised decrease alcohol intake

## 2019-04-11 NOTE — Telephone Encounter (Signed)
I would recommend her psychiatrist does this

## 2019-04-11 NOTE — Assessment & Plan Note (Signed)
Continue statin and fenofibrate Encouraged her to decrease her alcohol intake and start exercising Work on weight loss We will hold off on rechecking blood work until her next visit

## 2019-04-11 NOTE — Telephone Encounter (Signed)
Pt called and stated that she would like something for depression. She stated that you know what is going on but I was not really discussed this morning. Please advise.

## 2019-06-19 ENCOUNTER — Other Ambulatory Visit: Payer: Self-pay | Admitting: Internal Medicine

## 2019-07-23 NOTE — Progress Notes (Signed)
Subjective:    Patient ID: Desiree Andrews, female    DOB: May 21, 1956, 63 y.o.   MRN: [id number]  HPI The patient is here for an acute visit.   Right Foot swelling:  The beginning of August she jammed her foot into the door jam.  There was no fracture seen on xray at urgent care.  It has continue to swell daily since then. She has swelling on the lateral and slightly on the medial aspect of the ankle - she denies swelling in the distal foot or toes.  It is not currently swollen.  It gets swollen during the day - no matter what the activity is she does. She sits most of the day for work.  The foot is not painful.    She has tried wearing a compression ankle sleeve.  She denies left foot/ankle swelling.  Prior to the injury she did not have right ankle swelling.     Medications and allergies reviewed with patient and updated if appropriate.  Patient Active Problem List   Diagnosis Date Noted  . Foot swelling 04/11/2019  . Alcohol abuse 03/14/2019  . Lichen sclerosus [id number]  . Prediabetes 02/17/2017  . Obese 02/17/2017  . 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 PARESTHESIA, HANDS 05/20/2009  . Vitamin D deficiency 12/18/2008  . Osteopenia 12/18/2008  . BREAST CANCER, HX OF 12/18/2008  . Dyslipidemia 10/19/2007  . Hepatitis C antibody test positive 10/19/2007  . Essential hypertension 06/07/2007    Current Outpatient Medications on File Prior to Visit  Medication Sig Dispense Refill  . amLODipine (NORVASC) 2.5 MG tablet TAKE 1 TABLET(2.5 MG) BY MOUTH DAILY 90 tablet 1  . amLODipine (NORVASC) 5 MG tablet Take 1 tablet (5 mg total) by mouth daily. 90 tablet 0  . amphetamine-dextroamphetamine (ADDERALL) 20 MG tablet TK 1 T PO BID  0  . atorvastatin (LIPITOR) 10 MG tablet TAKE 1 TABLET(10 MG) BY MOUTH DAILY 90 tablet 1  . betamethasone dipropionate (DIPROLENE) 0.05 % ointment Apply topically 2 (two) times daily. 30 g  0  . cetirizine (ZYRTEC) 10 MG tablet Take 10 mg by mouth as needed.    . cholecalciferol (VITAMIN D3) 25 MCG (1000 UT) tablet Take 1,000 Units by mouth daily.    . fenofibrate (TRICOR) 145 MG tablet TAKE 1 TABLET(145 MG) BY MOUTH DAILY 90 tablet 1  . metoprolol tartrate (LOPRESSOR) 50 MG tablet Take 1.5 tablets (75 mg total) by mouth 2 (two) times daily. 270 tablet 3  . traZODone (DESYREL) 50 MG tablet Take 100 mg by mouth Nightly.     No current facility-administered medications on file prior to visit.     Past Medical History:  Diagnosis Date  . Allergy   . Anxiety   . Cancer ([place])    breast-left   . Cardiopulmonary arrest ([place]) 1991   Postpartum,? Pulmonary thromboembolism  . Depression    off cymbalta now  . Hyperlipidemia   . Hypertension     Past Surgical History:  Procedure Laterality Date  . BREAST LUMPECTOMY  2003   snbx-Left, Dr.Rubin; Radiation & Chemotherapy X3  . BREAST LUMPECTOMY WITH NEEDLE LOCALIZATION Right 11/30/2012   Procedure: BREAST LUMPECTOMY WITH NEEDLE LOCALIZATION;  Surgeon: Marcello Moores A. Cornett, MD;  Location: [facility];  Service: General;  Laterality: Right;  needle localization at [place] 8:00   . CARDIOVASCULAR STRESS TEST  2005   Neg  . COLONOSCOPY  2006   Normal  . WRIST SURGERY  2012   Left, Dr.Rowen    Social History   Socioeconomic History  . Marital status: Divorced    Spouse name: Not on file  . Number of children: Not on file  . Years of education: Not on file  . Highest education level: Not on file  Occupational History  . Not on file  Social Needs  . Financial resource strain: Not on file  . Food insecurity    Worry: Not on file    Inability: Not on file  . Transportation needs    Medical: Not on file    Non-medical: Not on file  Tobacco Use  . Smoking status: Former Smoker    Quit date: 08/24/1990    Years since quitting: 28.9  . Smokeless tobacco: Never Used  . Tobacco comment: smoked [place], up to < 1 ppd  Substance and Sexual Activity  . Alcohol use: Yes    Alcohol/week: 14.0 standard drinks    Types: 14 Glasses of wine per week    Comment: Socially  . Drug use: No  . Sexual activity: Not on file  Lifestyle  . Physical activity    Days per week: Not on file    Minutes per session: Not on file  . Stress: Not on file  Relationships  . Social Herbalist on phone: Not on file    Gets together: Not on file    Attends religious service: Not on file    Active member of club or organization: Not on file    Attends meetings of clubs or organizations: Not on file    Relationship status: Not on file  Other Topics Concern  . Not on file  Social History Narrative  . Not on file    Family History  Problem Relation Age of Onset  . Diabetes Mother   . Hypertension Mother   . Hyperlipidemia Mother   . Stroke Mother 42       02/2009  . Heart attack Maternal Grandmother        in her 56s  . Stomach cancer Maternal Grandfather 19  . Colon cancer Neg Hx   . Rectal cancer Neg Hx   . Colon polyps Neg Hx   . Esophageal cancer Neg Hx     Review of Systems  Constitutional: Negative for fever.  Musculoskeletal: Positive for joint swelling.  Skin: Negative for color change.  Neurological: Negative for numbness.       Objective:   Vitals:   07/24/19 0840  BP: 140/76  Pulse: 75  Resp: 16  Temp: 98.4 F (36.9 C)  SpO2: 98%   BP Readings from Last 3 Encounters:  07/24/19 140/76  04/11/19 130/68  03/14/19 (!) 162/96   Wt Readings from Last 3 Encounters:  07/24/19 179 lb (81.2 kg)  04/11/19 178 lb (80.7 kg)  03/14/19 178 lb (80.7 kg)   Body mass index is 31.71 kg/m.   Physical Exam Constitutional:      General: She is not in acute distress.    Appearance: Normal appearance. She is not ill-appearing.  Musculoskeletal:     Comments: No swelling in right foot or ankle at this time.  FROM of toes, ankle w/o pain.  No pain with palpation of toes,  foot or ankle  Skin:    General: Skin is warm and dry.     Findings: No bruising, erythema or rash.  Neurological:  Mental Status: She is alert.          Assessment & Plan:    See Problem List for Assessment and Plan of chronic medical problems.   This visit occurred during the SARS-CoV-2 public health emergency.  Safety protocols were in place, including screening questions prior to the visit, additional usage of staff PPE, and extensive cleaning of exam room while observing appropriate contact time as indicated for disinfecting solutions.

## 2019-07-24 ENCOUNTER — Ambulatory Visit (INDEPENDENT_AMBULATORY_CARE_PROVIDER_SITE_OTHER): Payer: Medicare Other | Admitting: Internal Medicine

## 2019-07-24 ENCOUNTER — Other Ambulatory Visit: Payer: Self-pay

## 2019-07-24 ENCOUNTER — Encounter: Payer: Self-pay | Admitting: Internal Medicine

## 2019-07-24 VITALS — BP 140/76 | HR 75 | Temp 98.4°F | Resp 16 | Ht 63.0 in | Wt 179.0 lb

## 2019-07-24 DIAGNOSIS — Z23 Encounter for immunization: Secondary | ICD-10-CM

## 2019-07-24 DIAGNOSIS — M25471 Effusion, right ankle: Secondary | ICD-10-CM

## 2019-07-24 NOTE — Assessment & Plan Note (Signed)
Swelling during the day w/o pain Sits most of the day for work - does not elevate foot Has tried compression which helps Likely related to injury in early August Since there is no pain symptomatic treatment only Advised it is common for swelling to persist from injury and they may have been damage does to her vein in the injury which may make swelling more likely

## 2019-07-24 NOTE — Patient Instructions (Signed)
Continue the compression on your right ankle.     Try to elevate the foot when sitting.

## 2019-08-07 ENCOUNTER — Other Ambulatory Visit: Payer: Self-pay | Admitting: Internal Medicine

## 2019-08-07 MED ORDER — AMLODIPINE BESYLATE 2.5 MG PO TABS: ORAL_TABLET | ORAL | 1 refills | Status: DC

## 2019-08-07 NOTE — Telephone Encounter (Signed)
Requested medication (s) are due for refill today: yes  Requested medication (s) are on the active medication list: yes  Last refill:  06/19/2019  Future visit scheduled: yes  Notes to clinic:  Dr. Quay Burow instructed the patient to increase. sig should state 1 pill twice daily.      Requested Prescriptions  Pending Prescriptions Disp Refills   amLODipine (NORVASC) 2.5 MG tablet 90 tablet 1    Sig: TAKE 1 TABLET(2.5 MG) BY MOUTH DAILY      Cardiovascular:  Calcium Channel Blockers Failed - 08/07/2019  1:52 PM      Failed - Last BP in normal range    BP Readings from Last 1 Encounters:  07/24/19 140/76          Passed - Valid encounter within last 6 months    Recent Outpatient Visits           2 weeks ago Need for vaccination with 13-polyvalent pneumococcal conjugate vaccine   [place], Claudina Lick, MD   3 months ago Essential hypertension   [place], Claudina Lick, MD   4 months ago Preventative health care   [place], MD   5 months ago Preventative health care   [place], MD   11 months ago Essential hypertension   [place], MD       Future Appointments             In 2 months Burns, Claudina Lick, MD [place], [facility]

## 2019-08-07 NOTE — Telephone Encounter (Signed)
Medication: amLODipine (NORVASC) 2.5 MG tablet [id number]  Dr. Quay Burow instructed the patient to increase. sig should state 1 pill twice daily.   Has the patient contacted their pharmacy? Yes  (Agent: If no, request that the patient contact the pharmacy for the refill.) (Agent: If yes, when and what did the pharmacy advise?)  Preferred Pharmacy (with phone number or street name): [facility] [place], [place] - [address]  Phone:  [phone] Fax:  [phone]     Agent: Please be advised that RX refills may take up to 3 business days. We ask that you follow-up with your pharmacy.

## 2019-08-15 ENCOUNTER — Telehealth: Payer: Self-pay | Admitting: Internal Medicine

## 2019-08-15 MED ORDER — AMLODIPINE BESYLATE 5 MG PO TABS: 5.0000 mg | ORAL_TABLET | Freq: Every day | ORAL | 1 refills | Status: DC

## 2019-08-15 NOTE — Telephone Encounter (Signed)
Rx updated. LVM letting pt know.

## 2019-08-15 NOTE — Telephone Encounter (Signed)
Pt called saying Dr. Quay Burow increased patients Amlodipine 2.5 to twice a day and the pharmacy needs a new rx written stating twice a day.  [place]   CB#  [phone]

## 2019-08-17 ENCOUNTER — Emergency Department (HOSPITAL_COMMUNITY): Payer: Medicare Other

## 2019-08-17 ENCOUNTER — Encounter (HOSPITAL_COMMUNITY): Payer: Self-pay | Admitting: Emergency Medicine

## 2019-08-17 ENCOUNTER — Other Ambulatory Visit: Payer: Self-pay

## 2019-08-17 ENCOUNTER — Emergency Department (HOSPITAL_COMMUNITY)
Admission: EM | Admit: 2019-08-17 | Discharge: 2019-08-17 | Disposition: A | Discharge status: Home / Self Care | Payer: Medicare Other | Attending: Emergency Medicine | Admitting: Emergency Medicine

## 2019-08-17 DIAGNOSIS — Y92008 Other place in unspecified non-institutional (private) residence as the place of occurrence of the external cause: Secondary | ICD-10-CM | POA: Insufficient documentation

## 2019-08-17 DIAGNOSIS — S82851A Displaced trimalleolar fracture of right lower leg, initial encounter for closed fracture: Secondary | ICD-10-CM | POA: Diagnosis not present

## 2019-08-17 DIAGNOSIS — Y999 Unspecified external cause status: Secondary | ICD-10-CM | POA: Insufficient documentation

## 2019-08-17 DIAGNOSIS — Z79899 Other long term (current) drug therapy: Secondary | ICD-10-CM | POA: Insufficient documentation

## 2019-08-17 DIAGNOSIS — Y939 Activity, unspecified: Secondary | ICD-10-CM | POA: Diagnosis not present

## 2019-08-17 DIAGNOSIS — S299XXA Unspecified injury of thorax, initial encounter: Secondary | ICD-10-CM | POA: Diagnosis not present

## 2019-08-17 DIAGNOSIS — Z853 Personal history of malignant neoplasm of breast: Secondary | ICD-10-CM | POA: Diagnosis not present

## 2019-08-17 DIAGNOSIS — R0902 Hypoxemia: Secondary | ICD-10-CM | POA: Diagnosis not present

## 2019-08-17 DIAGNOSIS — Z20828 Contact with and (suspected) exposure to other viral communicable diseases: Secondary | ICD-10-CM | POA: Insufficient documentation

## 2019-08-17 DIAGNOSIS — S9304XA Dislocation of right ankle joint, initial encounter: Secondary | ICD-10-CM

## 2019-08-17 DIAGNOSIS — Z87891 Personal history of nicotine dependence: Secondary | ICD-10-CM | POA: Diagnosis not present

## 2019-08-17 DIAGNOSIS — W19XXXA Unspecified fall, initial encounter: Secondary | ICD-10-CM | POA: Insufficient documentation

## 2019-08-17 DIAGNOSIS — I10 Essential (primary) hypertension: Secondary | ICD-10-CM | POA: Diagnosis not present

## 2019-08-17 DIAGNOSIS — R9431 Abnormal electrocardiogram [ECG] [EKG]: Secondary | ICD-10-CM | POA: Diagnosis not present

## 2019-08-17 DIAGNOSIS — R52 Pain, unspecified: Secondary | ICD-10-CM | POA: Diagnosis not present

## 2019-08-17 DIAGNOSIS — S8251XA Displaced fracture of medial malleolus of right tibia, initial encounter for closed fracture: Secondary | ICD-10-CM | POA: Diagnosis not present

## 2019-08-17 DIAGNOSIS — S99911A Unspecified injury of right ankle, initial encounter: Secondary | ICD-10-CM | POA: Diagnosis present

## 2019-08-17 DIAGNOSIS — Z03818 Encounter for observation for suspected exposure to other biological agents ruled out: Secondary | ICD-10-CM | POA: Diagnosis not present

## 2019-08-17 DIAGNOSIS — S82831A Other fracture of upper and lower end of right fibula, initial encounter for closed fracture: Secondary | ICD-10-CM | POA: Diagnosis not present

## 2019-08-17 DIAGNOSIS — Z01818 Encounter for other preprocedural examination: Secondary | ICD-10-CM | POA: Diagnosis not present

## 2019-08-17 LAB — TYPE AND SCREEN
ABO/RH(D): A NEG
Antibody Screen: NEGATIVE

## 2019-08-17 LAB — CBC WITH DIFFERENTIAL/PLATELET
Abs Immature Granulocytes: 0.05 10*3/uL (ref 0.00–0.07)
Basophils Absolute: 0.1 10*3/uL (ref 0.0–0.1)
Basophils Relative: 1 %
Eosinophils Absolute: 0.2 10*3/uL (ref 0.0–0.5)
Eosinophils Relative: 3 %
HCT: 40.4 % (ref 36.0–46.0)
Hemoglobin: 13.5 g/dL (ref 12.0–15.0)
Immature Granulocytes: 1 %
Lymphocytes Relative: 26 %
Lymphs Abs: 2.3 10*3/uL (ref 0.7–4.0)
MCH: 31.7 pg (ref 26.0–34.0)
MCHC: 33.4 g/dL (ref 30.0–36.0)
MCV: 94.8 fL (ref 80.0–100.0)
Monocytes Absolute: 0.8 10*3/uL (ref 0.1–1.0)
Monocytes Relative: 10 %
Neutro Abs: 5.4 10*3/uL (ref 1.7–7.7)
Neutrophils Relative %: 59 %
Platelets: 301 10*3/uL (ref 150–400)
RBC: 4.26 MIL/uL (ref 3.87–5.11)
RDW: 12.5 % (ref 11.5–15.5)
WBC: 8.9 10*3/uL (ref 4.0–10.5)
nRBC: 0 % (ref 0.0–0.2)

## 2019-08-17 LAB — BASIC METABOLIC PANEL
Anion gap: 11 (ref 5–15)
BUN: 12 mg/dL (ref 8–23)
CO2: 23 mmol/L (ref 22–32)
Calcium: 9.2 mg/dL (ref 8.9–10.3)
Chloride: 105 mmol/L (ref 98–111)
Creatinine, Ser: 0.82 mg/dL (ref 0.44–1.00)
GFR calc Af Amer: >60 mL/min (ref >60)
GFR calc non Af Amer: >60 mL/min (ref >60)
Glucose, Bld: 123 mg/dL — ABNORMAL HIGH (ref 70–99)
Potassium: 3.7 mmol/L (ref 3.5–5.1)
Sodium: 139 mmol/L (ref 135–145)

## 2019-08-17 LAB — RESPIRATORY PANEL BY RT PCR (FLU A&B, COVID)
Influenza A by PCR: NEGATIVE
Influenza B by PCR: NEGATIVE
SARS Coronavirus 2 by RT PCR: NEGATIVE

## 2019-08-17 LAB — ABO/RH: ABO/RH(D): A NEG

## 2019-08-17 MED ORDER — PROPOFOL 10 MG/ML IV BOLUS
0.5000 mg/kg | Freq: Once | INTRAVENOUS | Status: DC
  Filled 2019-08-17: qty 20

## 2019-08-17 MED ORDER — FENTANYL CITRATE (PF) 100 MCG/2ML IJ SOLN
50.0000 ug | Freq: Once | INTRAMUSCULAR | Status: AC
  Administered 2019-08-17: 50 ug via INTRAVENOUS
  Filled 2019-08-17: qty 2

## 2019-08-17 MED ORDER — HYDROCODONE-ACETAMINOPHEN 5-325 MG PO TABS: 1.0000 | ORAL_TABLET | Freq: Four times a day (QID) | ORAL | 0 refills | Status: DC | PRN

## 2019-08-17 MED ORDER — PROPOFOL 10 MG/ML IV BOLUS
INTRAVENOUS | Status: AC | PRN
  Administered 2019-08-17 (×4): 50 mg via INTRAVENOUS

## 2019-08-17 MED ORDER — PROPOFOL 10 MG/ML IV BOLUS
INTRAVENOUS | Status: AC | PRN
  Administered 2019-08-17: 50 mg via INTRAVENOUS
  Administered 2019-08-17: 30 mg via INTRAVENOUS
  Administered 2019-08-17: 50 mg via INTRAVENOUS
  Administered 2019-08-17: 20 mg via INTRAVENOUS
  Administered 2019-08-17: 30 mg via INTRAVENOUS

## 2019-08-17 NOTE — ED Notes (Signed)
Patient transported to X-ray 

## 2019-08-17 NOTE — ED Provider Notes (Signed)
Patient signed out to me by M. Alroy Bailiff, PA-C.  Please see previous notes for further history.  In brief, patient presenting for trimalleolar fracture and dislocation.  Attempted reduction in the ED with propofol showed partial reduction without complete reduction.  Dr. Lucia Gaskins to evaluate the patient.  Dr. Lucia Gaskins evaluated the patient recommends resedation and reduction.  This was performed by Dr. Alvino Chapel and Dr. Lucia Gaskins.  Given crutches and ambulated in the ED.  Will discharge on pain medication, follow-up with Ortho as instructed.  At this time, patient appears safe for discharge.  Return precautions given.  Patient states she understands and agrees to plan.    Franchot Heidelberg, PA-C 08/17/19 1843    Davonna Belling, MD 08/17/19 2350

## 2019-08-17 NOTE — ED Provider Notes (Signed)
[facility] DEPT Provider Note   CSN: [id number] Arrival date & time: 08/17/19  1003     History Chief Complaint  Patient presents with   Desiree Andrews is a 63 y.o. female with PMHx breast cancer s/p lumpectomy on left, HTN, osteopenia who presents to the ED via EMS complaining of sudden onset, constant, sharp, 10/10, right ankle pain s/p mechanical fall that occurred just PTA.  She reports she was walking out of her patio when she slipped on the top wet step causing her to fall and twist her right ankle and land directly on it.  She states she felt immediate pain to her ankle and was unable to get up without assistance.  No head injury or loss of consciousness.  States she fell down approximately 5 steps.  She has no other complaints at this time.  When EMS arrived on scene they did an obvious deformity to the right ankle.  Protective splint was applied.  EMS did not notice any open wounds to the leg.  She was given 50 mcg of fentanyl in route and states her pain is now 4 out of 10.  She denies any previous injury to the ankle.   The history is provided by the patient and the EMS personnel.       Past Medical History:  Diagnosis Date   Allergy    Anxiety    Cancer ([place])    breast-left    Cardiopulmonary arrest ([place]) 1991   Postpartum,? 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 Cornett, MD;  Location: [facility];  Service: General;  Laterality: Right;  needle localization at [place] 8:00    CARDIOVASCULAR STRESS TEST  2005   Neg   COLONOSCOPY  2006   Normal   WRIST SURGERY  2012   Left, Dr.Rowen     OB History   No obstetric history on file.     Family History  Problem Relation Age of Onset   Diabetes Mother    Hypertension Mother    Hyperlipidemia Mother    Stroke Mother 18       02/2009   Heart attack Maternal Grandmother        in her 68s   Stomach cancer Maternal Grandfather 78   Colon cancer Neg Hx    Rectal cancer Neg Hx    Colon polyps Neg Hx    Esophageal cancer Neg Hx     Social History   Tobacco Use   Smoking status: Former Smoker    Quit date: 08/24/1990    Years since  quitting: 29.0   Smokeless tobacco: Never Used   Tobacco comment: smoked [place], up to < 1 ppd  Substance Use Topics   Alcohol use: Yes    Alcohol/week: 14.0 standard drinks    Types: 14 Glasses of wine per week    Comment: Socially   Drug use: No    Home Medications Prior to Admission medications   Medication Sig Start Date End Date Taking? Authorizing Provider  amLODipine (NORVASC) 5 MG tablet Take 1 tablet (5 mg total) by mouth daily. Patient taking differently: Take 5 mg by mouth 2 (two) times daily.  08/15/19  Yes Burns, Claudina Lick, MD  amphetamine-dextroamphetamine (ADDERALL) 20 MG tablet Take 10 mg by mouth 2 (two) times daily.  08/04/17  Yes [provider]  atorvastatin (LIPITOR) 10 MG tablet TAKE 1 TABLET(10 MG) BY MOUTH DAILY Patient taking differently: Take 10 mg by mouth daily at 12  noon.  08/06/18  Yes Burns, Claudina Lick, MD  cetirizine (ZYRTEC) 10 MG tablet Take 10 mg by mouth as needed for allergies.  06/04/13  Yes Weber, Sarah L, PA-C  fenofibrate (TRICOR) 145 MG tablet TAKE 1 TABLET(145 MG) BY MOUTH DAILY Patient taking differently: Take 145 mg by mouth daily.  03/21/19  Yes Burns, Claudina Lick, MD  metoprolol tartrate (LOPRESSOR) 50 MG tablet Take 1.5 tablets (75 mg total) by mouth 2 (two) times daily. 03/14/19  Yes Burns, Claudina Lick, MD  traZODone (DESYREL) 50 MG tablet Take 100 mg by mouth at bedtime as needed for sleep.  12/13/18  Yes [provider]  betamethasone dipropionate (DIPROLENE) 0.05 % ointment Apply topically 2 (two) times daily. Patient not taking: Reported on 08/17/2019 07/07/18   Princess Bruins, MD    Allergies    Pravastatin  Review of Systems   Review of Systems  Constitutional: Negative for chills and fever.  Musculoskeletal: Positive for arthralgias. Negative for back pain.  Neurological: Negative for syncope and headaches.  All other systems reviewed and are negative.   Physical Exam Updated Vital Signs BP (!) 154/93 (BP Location: Right Arm)    Pulse 85    Temp 98.1 F (36.7 C) (Oral)    Resp 14    Ht 5\' 3"  (1.6 m)    Wt 77.1 kg    SpO2 95%    BMI 30.11 kg/m   Physical Exam Vitals and nursing note reviewed.  Constitutional:      Appearance: She is not ill-appearing or diaphoretic.  HENT:     Head: Normocephalic and atraumatic.     Comments: No raccoon's sign or battle's sign Eyes:     Extraocular Movements: Extraocular movements intact.     Conjunctiva/sclera: Conjunctivae normal.     Pupils: Pupils are equal, round, and reactive to light.  Cardiovascular:     Rate and Rhythm: Normal rate and regular rhythm.  Pulmonary:     Effort: Pulmonary effort is normal.     Breath sounds: Normal breath sounds. No wheezing, rhonchi or rales.  Abdominal:     Palpations: Abdomen is soft.     Tenderness: There is no abdominal tenderness.  There is no guarding or rebound.  Musculoskeletal:     Cervical back: Normal range of motion and neck supple.     Comments: Obvious deformity noted to distal right lower extremity with TTP. No tenderness proximally to tib fib, knee, femur, or hip. No signs of open wounds. Compartment is soft. Neurovascularly intact. 2+ DP pulse.   Skin:  General: Skin is warm and dry.  Neurological:     General: No focal deficit present.     Mental Status: She is alert and oriented to person, place, and time.     GCS: GCS eye subscore is 4. GCS verbal subscore is 5. GCS motor subscore is 6.     ED Results / Procedures / Treatments   Labs (all labs ordered are listed, but only abnormal results are displayed) Labs Reviewed  BASIC METABOLIC PANEL - Abnormal; Notable for the following components:      Result Value   Glucose, Bld 123 (*)    All other components within normal limits  RESPIRATORY PANEL BY RT PCR (FLU A&B, COVID)  CBC WITH DIFFERENTIAL/PLATELET  TYPE AND SCREEN  ABO/RH    EKG EKG Interpretation  Date/Time:  Thursday August 17 2019 10:14:35 EST Ventricular Rate:  87 PR Interval:    QRS Duration: 100 QT Interval:  395 QTC Calculation: 476 R Axis:   -14 Text Interpretation: Sinus rhythm Abnormal R-wave progression, early transition Baseline wander in lead(s) V2 Confirmed by Julianne Rice [phone]) on 08/17/2019 10:26:28 AM   Radiology DG Chest 1 View  Result Date: 08/17/2019 CLINICAL DATA:  Preoperative assessment, right ankle fracture. Fall. EXAM: CHEST  1 VIEW COMPARISON:  Report from CT chest 10/05/2002 FINDINGS: The heart size and mediastinal contours are within normal limits. Both lungs are clear. The visualized skeletal structures are unremarkable. Postoperative findings, left breast/axilla. IMPRESSION: 1.  No active cardiopulmonary disease is radiographically apparent. Electronically Signed   By: Van Clines M.D.   On: 08/17/2019 11:49   DG Tibia/Fibula  Right  Result Date: 08/17/2019 CLINICAL DATA:  Fall, deformity EXAM: RIGHT TIBIA AND FIBULA - 2 VIEW COMPARISON:  Right ankle series today FINDINGS: There is a comminuted displaced fracture dislocation at the right ankle, likely trimalleolar fracture. The talus is dislocated laterally relative to the tibia. There is also concern for proximal fibular fracture in the neck and proximal shaft. IMPRESSION: Trimalleolar fracture dislocation. Concern for proximal fibular fracture. Electronically Signed   By: Rolm Baptise M.D.   On: 08/17/2019 10:59   DG Ankle Complete Right  Result Date: 08/17/2019 CLINICAL DATA:  Post reduction right ankle fracture dislocation. EXAM: RIGHT ANKLE - COMPLETE 3+ VIEW COMPARISON:  Earlier same day. FINDINGS: Overlying cast obscures evaluation of bony detail. There is mild residual anteromedial subluxation of the tibia on the talus. Mild persistent displacement of the distal fibular fracture and medial malleolar fracture. Suggestion of minimally displaced posterior malleolar fracture. Remainder the exam is unchanged IMPRESSION: Mild residual displacement over patient's known distal fibular, medial malleolar and posterior malleolar fractures. Mild residual anteromedial subluxation of the tibia on the talus. Overlying cast. Electronically Signed   By: Marin Olp M.D.   On: 08/17/2019 13:25   DG Ankle Complete Right  Result Date: 08/17/2019 CLINICAL DATA:  Fall.  Pain and deformity EXAM: RIGHT ANKLE - COMPLETE 3+ VIEW COMPARISON:  None. FINDINGS: There is a comminuted displaced fracture dislocation at the right ankle, likely trimalleolar fracture. The talus is dislocated laterally relative to the tibia. IMPRESSION: Trimalleolar fracture dislocation. Electronically Signed   By: Rolm Baptise M.D.   On: 08/17/2019 10:57   DG Foot Complete Right  Result Date: 08/17/2019 CLINICAL DATA:  Fall.  Ankle pain and deformity. EXAM: RIGHT FOOT COMPLETE - 3+ VIEW COMPARISON:  None.  FINDINGS: Trimalleolar fracture dislocation noted at the right ankle is seen on ankle series. Bone fragment adjacent to the base  of the 5th metatarsal appears corticated and may be related to old injury. No definite acute fracture within the foot. IMPRESSION: Right ankle trimalleolar fracture dislocation. No definite acute findings in the right foot. Electronically Signed   By: Rolm Baptise M.D.   On: 08/17/2019 10:58    Procedures Reduction of dislocation  Date/Time: 08/17/2019 1:05 PM Performed by: Eustaquio Maize, PA-C Authorized by: Eustaquio Maize, PA-C  Consent: Verbal consent obtained. Consent given by: patient Time out: Immediately prior to procedure a "time out" was called to verify the correct patient, procedure, equipment, support staff and site/side marked as required.  Sedation: Patient sedated: yes Sedation type: moderate (conscious) sedation Sedatives: propofol Analgesia: fentanyl Sedation start date/time: 08/17/2019 12:26 PM Sedation end date/time: 08/17/2019 12:40 PM Vitals: Vital signs were monitored during sedation.  Patient tolerance: patient tolerated the procedure well with no immediate complications    (including critical care time)  Medications Ordered in ED Medications  propofol (DIPRIVAN) 10 mg/mL bolus/IV push 38.6 mg (38.6 mg Intravenous Not Given 08/17/19 1251)  propofol (DIPRIVAN) 10 mg/mL bolus/IV push 38.6 mg (has no administration in time range)  propofol (DIPRIVAN) 10 mg/mL bolus/IV push (50 mg Intravenous Given 08/17/19 1246)  fentaNYL (SUBLIMAZE) injection 50 mcg (50 mcg Intravenous Given 08/17/19 1434)    ED Course  I have reviewed the triage vital signs and the nursing notes.  Pertinent labs & imaging results that were available during my care of the patient were reviewed by me and considered in my medical decision making (see chart for details).  63 year old female presents the ED today status post mechanical fall down patio steps that  are slippery due to the rain.  Fell directly on right ankle.  Obvious deformity noted with EMS.  Given 50 mcg in route with mild relief of pain.  No injuries at this time.  No head injury.  Will obtain x-rays at this time and preop labs with anticipation for surgical repair.  Patient may need reduction in the ED prior to surgery. Attending physician Dr. Lita Mains has seen and evaluated patient as well and agrees with plan.   Xray with triamalleolar dislocation fracture with questionable proximal fibular fracture. Will attempt reduction in the ED prior to surgical consult.   Conscious sedation and reduction performed without complications. Leg splinted and elevated. Will obtain post reduction xray and consult ortho surgery. '  Post reduction xray with subluxation of the tibia over talus. Will consult ortho surgery at this time.   Clinical Course as of Aug 16 1524  Thu Aug 17, 2019  1509 Discussed case with Dr. Lucia Gaskins with ortho surgery who will come down to the ED and attempt reduction.    [MV]    Clinical Course User Index [MV] Eustaquio Maize, PA-C   3:21 PM At shift change case signed out to [facility], PA-C, who will dispo patient accordingly after Dr. Lucia Gaskins attempts reduction at bedside.   MDM Rules/Calculators/A&P                       Final Clinical Impression(s) / ED Diagnoses Final diagnoses:  Fall, initial encounter  Closed trimalleolar fracture of right ankle, initial encounter  Dislocation of right ankle joint, initial encounter    Rx / DC Orders ED Discharge Orders    None       Eustaquio Maize, PA-C 08/17/19 1525    Julianne Rice, MD 08/18/19 1356

## 2019-08-17 NOTE — ED Triage Notes (Signed)
Patient arrived by EMS from home. Pt c/o RT ankle pain. Pt had fall on front porch per EMS. Pt denies LOC . EMS report deformity to RT ankle.    50 mcg of Fentanyl given by EMS.   VS WDL per EMS.  Hx of HTN and Breast Cancer.

## 2019-08-17 NOTE — Discharge Instructions (Addendum)
Take ibuprofen 3 times a day with meals.  Do not take other anti-inflammatories at the same time (Advil, Motrin, naproxen, Aleve). You may supplement with Tylenol if you need further pain control. Use Norco as needed for severe breakthrough pain.  Have caution, this may make you tired or groggy.  Do not drive or operate heavy machinery while taking this medicine. Use ice packs, 20 minutes at a time, 3 times a day. Keep your leg elevated. Do not put any weight on your leg.  Use crutches to get around. Follow-up with orthopedic doctor as instructed. Return to the emergency room with any new, worsening, concerning symptoms.

## 2019-08-17 NOTE — Sedation Documentation (Signed)
Second Sedation starts now.

## 2019-08-17 NOTE — ED Notes (Signed)
Patient stated she has a family member taking her home after discharge.

## 2019-08-17 NOTE — ED Provider Notes (Signed)
  Physical Exam  BP 121/82   Pulse 80   Temp 98.1 F (36.7 C) (Oral)   Resp 18   Ht 5\' 3"  (1.6 m)   Wt 77.1 kg   SpO2 99%   BMI 30.11 kg/m   Physical Exam  ED Course/Procedures   Clinical Course as of Aug 16 2346  Thu Aug 17, 2019  1509 Discussed case with Dr. Lucia Gaskins with ortho surgery who will come down to the ED and attempt reduction.    [MV]    Clinical Course User Index [MV] Eustaquio Maize, PA-C    .Sedation  Date/Time: 08/17/2019 11:47 PM Performed by: Davonna Belling, MD Authorized by: Davonna Belling, MD   Consent:    Consent obtained:  Verbal   Consent given by:  Patient   Risks discussed:  Allergic reaction, dysrhythmia, nausea, inadequate sedation, prolonged hypoxia resulting in organ damage and prolonged sedation necessitating reversal Universal protocol:    Immediately prior to procedure a time out was called: yes     Patient identity confirmation method:  Arm band and verbally with patient Indications:    Procedure performed:  Fracture reduction   Procedure necessitating sedation performed by:  Different physician Pre-sedation assessment:    Time since last food or drink:  4   ASA classification: class 2 - patient with mild systemic disease     Mallampati score:  III - soft palate, base of uvula visible   Pre-sedation assessments completed and reviewed: airway patency, cardiovascular function, hydration status, mental status and pain level   Immediate pre-procedure details:    Reassessment: Patient reassessed immediately prior to procedure     Reviewed: vital signs   Procedure details (see MAR for exact dosages):    Preoxygenation:  Nasal cannula   Sedation:  Propofol   Intended level of sedation: deep   Intra-procedure events: none     Intra-procedure management:  Airway repositioning   Total Provider sedation time (minutes):  10 Post-procedure details:    Attendance: Constant attendance by certified staff until patient recovered    Recovery: Patient returned to pre-procedure baseline     Patient is stable for discharge or admission: yes     Patient tolerance:  Tolerated well, no immediate complications    MDM  Patient sedated or reduction by Dr. Lucia Gaskins.  Patient returned to her baseline.  Discharge home.      Davonna Belling, MD 08/17/19 [phone]

## 2019-08-17 NOTE — Consult Note (Addendum)
Reason for Consult: Right ankle fracture dislocation Referring Physician: [facility]  Desiree Andrews is an 63 y.o. female.  HPI: Patient slipped on the stairs today and had immediate pain and deformity in her right ankle.  She is brought to the emergency department due to an able to ambulate status.  She was diagnosed with a dislocated trimalleolar ankle fracture.  Attempted reduction was made in the emergency department with propofol however there was persistent subluxation posteriorly and laterally after splinting.  Ortho was consulted for assistance with reduction and evaluation.  Patient has continued pain in the right ankle.  Denies any symptomatic numbness or tingling.  She has pain in the right ankle only.  No other extremity or joint pain.  Denies any recent fevers or chills.  Past Medical History:  Diagnosis Date  . Allergy   . Anxiety   . Cancer ([place])    breast-left   . Cardiopulmonary arrest ([place]) 1991   Postpartum,? Pulmonary thromboembolism  . Depression    off cymbalta now  . Hyperlipidemia   . Hypertension     Past Surgical History:  Procedure Laterality Date  . BREAST LUMPECTOMY  2003   snbx-Left, Dr.Rubin; Radiation & Chemotherapy X3  . BREAST LUMPECTOMY WITH NEEDLE LOCALIZATION Right 11/30/2012   Procedure: BREAST LUMPECTOMY WITH NEEDLE LOCALIZATION;  Surgeon: Marcello Moores A. Cornett, MD;  Location: [facility];  Service: General;  Laterality: Right;  needle localization at [place] 8:00   . CARDIOVASCULAR STRESS TEST  2005   Neg  . COLONOSCOPY  2006   Normal  . WRIST SURGERY  2012   Left, Dr.Rowen    Family History  Problem Relation Age of Onset  . Diabetes Mother   . Hypertension Mother   . Hyperlipidemia Mother   . Stroke Mother 74       02/2009  . Heart attack Maternal Grandmother        in her 79s  . Stomach cancer Maternal Grandfather 71  . Colon cancer Neg Hx   . Rectal cancer Neg Hx   . Colon polyps  Neg Hx   . Esophageal cancer Neg Hx     Social History:  reports that she quit smoking about 29 years ago. She has never used smokeless tobacco. She reports current alcohol use of about 14.0 standard drinks of alcohol per week. She reports that she does not use drugs.  Allergies:  Allergies  Allergen Reactions  . Pravastatin     Sleep disturbance    Medications: I have reviewed the patient's current medications.  Results for orders placed or performed during the hospital encounter of 08/17/19 (from the past 48 hour(s))  ABO/Rh     Status: None   Collection Time: 08/17/19 10:18 AM  Result Value Ref Range   ABO/RH(D)      A NEG Performed at [facility] [address] [place], [place] [address]   Respiratory Panel by RT PCR (Flu A&B, Covid) - Nasopharyngeal Swab     Status: None   Collection Time: 08/17/19 10:20 AM   Specimen: Nasopharyngeal Swab  Result Value Ref Range   SARS Coronavirus 2 by RT PCR NEGATIVE NEGATIVE    Comment: (NOTE) SARS-CoV-2 target nucleic acids are NOT DETECTED. The SARS-CoV-2 RNA is generally detectable in upper respiratoy specimens during the acute phase of infection. The lowest concentration of SARS-CoV-2 viral copies this assay can detect is 131 copies/mL. A negative result does not preclude SARS-Cov-2  infection and should not be used as the sole basis for treatment or other patient management decisions. A negative result may occur with  improper specimen collection/handling, submission of specimen other than nasopharyngeal swab, presence of viral mutation(s) within the areas targeted by this assay, and inadequate number of viral copies (<131 copies/mL). A negative result must be combined with clinical observations, patient history, and epidemiological information. The expected result is Negative. Fact Sheet for Patients:  PinkCheek.be Fact Sheet for Healthcare Providers:   GravelBags.it This test is not yet ap proved or cleared by the [place] FDA and  has been authorized for detection and/or diagnosis of SARS-CoV-2 by FDA under an Emergency Use Authorization (EUA). This EUA will remain  in effect (meaning this test can be used) for the duration of the COVID-19 declaration under Section 564(b)(1) of the Act, 21 U.S.C. section 360bbb-3(b)(1), unless the authorization is terminated or revoked sooner.    Influenza A by PCR NEGATIVE NEGATIVE   Influenza B by PCR NEGATIVE NEGATIVE    Comment: (NOTE) The Xpert Xpress SARS-CoV-2/FLU/RSV assay is intended as an aid in  the diagnosis of influenza from Nasopharyngeal swab specimens and  should not be used as a sole basis for treatment. Nasal washings and  aspirates are unacceptable for Xpert Xpress SARS-CoV-2/FLU/RSV  testing. Fact Sheet for Patients: PinkCheek.be Fact Sheet for Healthcare Providers: GravelBags.it This test is not yet approved or cleared by the [place] FDA and  has been authorized for detection and/or diagnosis of SARS-CoV-2 by  FDA under an Emergency Use Authorization (EUA). This EUA will remain  in effect (meaning this test can be used) for the duration of the  Covid-19 declaration under Section 564(b)(1) of the Act, 21  U.S.C. section 360bbb-3(b)(1), unless the authorization is  terminated or revoked. Performed at [facility], [place] [address], [place], [place] [id number]   Basic metabolic panel     Status: Abnormal   Collection Time: 08/17/19 10:27 AM  Result Value Ref Range   Sodium 139 135 - 145 mmol/L   Potassium 3.7 3.5 - 5.1 mmol/L   Chloride 105 98 - 111 mmol/L   CO2 23 22 - 32 mmol/L   Glucose, Bld 123 (H) 70 - 99 mg/dL   BUN 12 8 - 23 mg/dL   Creatinine, Ser 0.82 0.44 - 1.00 mg/dL   Calcium 9.2 8.9 - 10.3 mg/dL   GFR calc non Af Amer >60 >60 mL/min   GFR calc  Af Amer >60 >60 mL/min   Anion gap 11 5 - 15    Comment: Performed at [facility], [place] [address], [place], [place] [address]  CBC with Differential     Status: None   Collection Time: 08/17/19 10:27 AM  Result Value Ref Range   WBC 8.9 4.0 - 10.5 K/uL   RBC 4.26 3.87 - 5.11 MIL/uL   Hemoglobin 13.5 12.0 - 15.0 g/dL   HCT 40.4 36.0 - 46.0 %   MCV 94.8 80.0 - 100.0 fL   MCH 31.7 26.0 - 34.0 pg   MCHC 33.4 30.0 - 36.0 g/dL   RDW 12.5 11.5 - 15.5 %   Platelets 301 150 - 400 K/uL   nRBC 0.0 0.0 - 0.2 %   Neutrophils Relative % 59 %   Neutro Abs 5.4 1.7 - 7.7 K/uL   Lymphocytes Relative 26 %   Lymphs Abs 2.3 0.7 - 4.0 K/uL   Monocytes Relative 10 %   Monocytes Absolute 0.8  0.1 - 1.0 K/uL   Eosinophils Relative 3 %   Eosinophils Absolute 0.2 0.0 - 0.5 K/uL   Basophils Relative 1 %   Basophils Absolute 0.1 0.0 - 0.1 K/uL   Immature Granulocytes 1 %   Abs Immature Granulocytes 0.05 0.00 - 0.07 K/uL    Comment: Performed at [facility], [place] [address], [place], [place] [address]  Type and screen [place]     Status: None   Collection Time: 08/17/19 11:04 AM  Result Value Ref Range   ABO/RH(D) A NEG    Antibody Screen NEG    Sample Expiration      08/20/2019,2359 Performed at [facility], [place] [address] [place], [place] [address]     DG Chest 1 View  Result Date: 08/17/2019 CLINICAL DATA:  Preoperative assessment, right ankle fracture. Fall. EXAM: CHEST  1 VIEW COMPARISON:  Report from CT chest 10/05/2002 FINDINGS: The heart size and mediastinal contours are within normal limits. Both lungs are clear. The visualized skeletal structures are unremarkable. Postoperative findings, left breast/axilla. IMPRESSION: 1.  No active cardiopulmonary disease is radiographically apparent. Electronically Signed   By: Van Clines M.D.   On: 08/17/2019 11:49   DG Tibia/Fibula Right  Result Date:  08/17/2019 CLINICAL DATA:  Fall, deformity EXAM: RIGHT TIBIA AND FIBULA - 2 VIEW COMPARISON:  Right ankle series today FINDINGS: There is a comminuted displaced fracture dislocation at the right ankle, likely trimalleolar fracture. The talus is dislocated laterally relative to the tibia. There is also concern for proximal fibular fracture in the neck and proximal shaft. IMPRESSION: Trimalleolar fracture dislocation. Concern for proximal fibular fracture. Electronically Signed   By: Rolm Baptise M.D.   On: 08/17/2019 10:59   DG Ankle Complete Right  Result Date: 08/17/2019 CLINICAL DATA:  Post reduction right ankle fracture dislocation. EXAM: RIGHT ANKLE - COMPLETE 3+ VIEW COMPARISON:  Earlier same day. FINDINGS: Overlying cast obscures evaluation of bony detail. There is mild residual anteromedial subluxation of the tibia on the talus. Mild persistent displacement of the distal fibular fracture and medial malleolar fracture. Suggestion of minimally displaced posterior malleolar fracture. Remainder the exam is unchanged IMPRESSION: Mild residual displacement over patient's known distal fibular, medial malleolar and posterior malleolar fractures. Mild residual anteromedial subluxation of the tibia on the talus. Overlying cast. Electronically Signed   By: Marin Olp M.D.   On: 08/17/2019 13:25   DG Ankle Complete Right  Result Date: 08/17/2019 CLINICAL DATA:  Fall.  Pain and deformity EXAM: RIGHT ANKLE - COMPLETE 3+ VIEW COMPARISON:  None. FINDINGS: There is a comminuted displaced fracture dislocation at the right ankle, likely trimalleolar fracture. The talus is dislocated laterally relative to the tibia. IMPRESSION: Trimalleolar fracture dislocation. Electronically Signed   By: Rolm Baptise M.D.   On: 08/17/2019 10:57   DG Foot Complete Right  Result Date: 08/17/2019 CLINICAL DATA:  Fall.  Ankle pain and deformity. EXAM: RIGHT FOOT COMPLETE - 3+ VIEW COMPARISON:  None. FINDINGS: Trimalleolar  fracture dislocation noted at the right ankle is seen on ankle series. Bone fragment adjacent to the base of the 5th metatarsal appears corticated and may be related to old injury. No definite acute fracture within the foot. IMPRESSION: Right ankle trimalleolar fracture dislocation. No definite acute findings in the right foot. Electronically Signed   By: Rolm Baptise M.D.   On: 08/17/2019 10:58    Review of Systems  Constitutional: Negative.   HENT: Negative.   Eyes:  Negative.   Respiratory: Negative.   Cardiovascular: Negative.   Gastrointestinal: Negative.   Musculoskeletal:       Right ankle pain  Neurological: Negative.   Hematological: Negative.   Psychiatric/Behavioral: Negative.    Blood pressure 131/66, pulse 79, temperature 98.1 F (36.7 C), temperature source Oral, resp. rate 20, height 5\' 3"  (1.6 m), weight 77.1 kg, SpO2 98 %. Physical Exam  Vitals reviewed. Constitutional: She appears well-developed.  HENT:  Head: Normocephalic.  Eyes: Conjunctivae are normal.  Cardiovascular: Normal rate.  Respiratory: Effort normal.  GI: Soft.  Musculoskeletal:     Cervical back: Neck supple.     Comments: Right ankle in a short leg splint.  Slight valgus of the ankle.  She is able to wiggle toes.  Endorse sensation about the toes with good brisk capillary refill.  No tenderness palpation proximal to the splint about the knee.  No evidence of bilateral upper extremity or left lower extremity injury  Neurological: She is alert.  Skin: Skin is warm.  Psychiatric: She has a normal mood and affect.    Assessment/Plan: We will reattempt closed reduction and splinting.  Hopefully we can mold the splint more appropriately to hold it in place.  Post reduction we will get a CT scan to better evaluate her ankle.  She will need surgical intervention and she understands this.  We will plan to schedule this as an outpatient.  Update after reduction.   Patient underwent closed reduction.   Reduced operatively.  She will be discharged from the emergency department.  Will call her for surgery scheduling.  CT will be ordered in the emergency department before discharge.   Procedure note: Propofol was given after timeout was performed.  This was done after verbal consent.  After propofol sedation patient underwent closed reduction of her right ankle fracture dislocation.  This was then splinted in a short leg nonweightbearing splint.  Adequate amount of she cotton was used.  Post reduction x-rays revealed acceptable reduction in the coronal plane and slight continued posterior subluxation of the sagittal plane that was acceptable.    Erle Crocker 08/17/2019, 3:37 PM

## 2019-08-21 ENCOUNTER — Other Ambulatory Visit: Payer: Self-pay | Admitting: Orthopaedic Surgery

## 2019-08-22 ENCOUNTER — Ambulatory Visit (HOSPITAL_COMMUNITY): Payer: Medicare Other

## 2019-08-22 ENCOUNTER — Encounter (HOSPITAL_BASED_OUTPATIENT_CLINIC_OR_DEPARTMENT_OTHER): Payer: Self-pay | Admitting: Orthopaedic Surgery

## 2019-08-22 ENCOUNTER — Ambulatory Visit (HOSPITAL_BASED_OUTPATIENT_CLINIC_OR_DEPARTMENT_OTHER): Payer: Medicare Other | Admitting: Certified Registered Nurse Anesthetist

## 2019-08-22 ENCOUNTER — Ambulatory Visit (HOSPITAL_BASED_OUTPATIENT_CLINIC_OR_DEPARTMENT_OTHER)
Admission: RE | Admit: 2019-08-22 | Discharge: 2019-08-22 | Disposition: A | Discharge status: Home / Self Care | Payer: Medicare Other | Attending: Orthopaedic Surgery | Admitting: Orthopaedic Surgery

## 2019-08-22 ENCOUNTER — Encounter (HOSPITAL_BASED_OUTPATIENT_CLINIC_OR_DEPARTMENT_OTHER)
Admission: RE | Disposition: A | Discharge status: Home / Self Care | Payer: Self-pay | Source: Home / Self Care | Attending: Orthopaedic Surgery

## 2019-08-22 DIAGNOSIS — Y92009 Unspecified place in unspecified non-institutional (private) residence as the place of occurrence of the external cause: Secondary | ICD-10-CM | POA: Insufficient documentation

## 2019-08-22 DIAGNOSIS — Z8674 Personal history of sudden cardiac arrest: Secondary | ICD-10-CM | POA: Insufficient documentation

## 2019-08-22 DIAGNOSIS — W010XXA Fall on same level from slipping, tripping and stumbling without subsequent striking against object, initial encounter: Secondary | ICD-10-CM | POA: Insufficient documentation

## 2019-08-22 DIAGNOSIS — Z79899 Other long term (current) drug therapy: Secondary | ICD-10-CM | POA: Diagnosis not present

## 2019-08-22 DIAGNOSIS — Z8249 Family history of ischemic heart disease and other diseases of the circulatory system: Secondary | ICD-10-CM | POA: Insufficient documentation

## 2019-08-22 DIAGNOSIS — Z20828 Contact with and (suspected) exposure to other viral communicable diseases: Secondary | ICD-10-CM | POA: Diagnosis not present

## 2019-08-22 DIAGNOSIS — F329 Major depressive disorder, single episode, unspecified: Secondary | ICD-10-CM | POA: Insufficient documentation

## 2019-08-22 DIAGNOSIS — I10 Essential (primary) hypertension: Secondary | ICD-10-CM | POA: Insufficient documentation

## 2019-08-22 DIAGNOSIS — G8918 Other acute postprocedural pain: Secondary | ICD-10-CM | POA: Diagnosis not present

## 2019-08-22 DIAGNOSIS — E785 Hyperlipidemia, unspecified: Secondary | ICD-10-CM | POA: Insufficient documentation

## 2019-08-22 DIAGNOSIS — Z888 Allergy status to other drugs, medicaments and biological substances status: Secondary | ICD-10-CM | POA: Insufficient documentation

## 2019-08-22 DIAGNOSIS — Z8349 Family history of other endocrine, nutritional and metabolic diseases: Secondary | ICD-10-CM | POA: Diagnosis not present

## 2019-08-22 DIAGNOSIS — S92121A Displaced fracture of body of right talus, initial encounter for closed fracture: Secondary | ICD-10-CM | POA: Insufficient documentation

## 2019-08-22 DIAGNOSIS — S82851A Displaced trimalleolar fracture of right lower leg, initial encounter for closed fracture: Secondary | ICD-10-CM | POA: Insufficient documentation

## 2019-08-22 DIAGNOSIS — S92101A Unspecified fracture of right talus, initial encounter for closed fracture: Secondary | ICD-10-CM | POA: Diagnosis not present

## 2019-08-22 DIAGNOSIS — S9304XA Dislocation of right ankle joint, initial encounter: Secondary | ICD-10-CM | POA: Diagnosis not present

## 2019-08-22 DIAGNOSIS — Z419 Encounter for procedure for purposes other than remedying health state, unspecified: Secondary | ICD-10-CM

## 2019-08-22 DIAGNOSIS — F419 Anxiety disorder, unspecified: Secondary | ICD-10-CM | POA: Diagnosis not present

## 2019-08-22 DIAGNOSIS — Z87891 Personal history of nicotine dependence: Secondary | ICD-10-CM | POA: Diagnosis not present

## 2019-08-22 DIAGNOSIS — S93431A Sprain of tibiofibular ligament of right ankle, initial encounter: Secondary | ICD-10-CM | POA: Diagnosis not present

## 2019-08-22 DIAGNOSIS — S82891D Other fracture of right lower leg, subsequent encounter for closed fracture with routine healing: Secondary | ICD-10-CM | POA: Diagnosis not present

## 2019-08-22 DIAGNOSIS — S92111A Displaced fracture of neck of right talus, initial encounter for closed fracture: Secondary | ICD-10-CM | POA: Insufficient documentation

## 2019-08-22 DIAGNOSIS — Z7982 Long term (current) use of aspirin: Secondary | ICD-10-CM | POA: Diagnosis not present

## 2019-08-22 HISTORY — PX: ORIF ANKLE FRACTURE: SHX5408

## 2019-08-22 LAB — RESPIRATORY PANEL BY RT PCR (FLU A&B, COVID)
Influenza A by PCR: NEGATIVE
Influenza B by PCR: NEGATIVE
SARS Coronavirus 2 by RT PCR: NEGATIVE

## 2019-08-22 SURGERY — OPEN REDUCTION INTERNAL FIXATION (ORIF) ANKLE FRACTURE
Anesthesia: General | Site: Ankle | Laterality: Right

## 2019-08-22 MED ORDER — LACTATED RINGERS IV SOLN: INTRAVENOUS | Status: DC

## 2019-08-22 MED ORDER — ARTIFICIAL TEARS OPHTHALMIC OINT
TOPICAL_OINTMENT | OPHTHALMIC | Status: DC | PRN
  Administered 2019-08-22: 1 via OPHTHALMIC

## 2019-08-22 MED ORDER — MIDAZOLAM HCL 2 MG/2ML IJ SOLN
1.0000 mg | INTRAMUSCULAR | Status: DC | PRN
  Administered 2019-08-22: 2 mg via INTRAVENOUS

## 2019-08-22 MED ORDER — FENTANYL CITRATE (PF) 100 MCG/2ML IJ SOLN
INTRAMUSCULAR | Status: DC | PRN
  Administered 2019-08-22: 25 ug via INTRAVENOUS
  Administered 2019-08-22: 50 ug via INTRAVENOUS
  Administered 2019-08-22: 25 ug via INTRAVENOUS

## 2019-08-22 MED ORDER — GLYCOPYRROLATE 0.2 MG/ML IJ SOLN
INTRAMUSCULAR | Status: DC | PRN
  Administered 2019-08-22: .2 mg via INTRAVENOUS

## 2019-08-22 MED ORDER — MEPERIDINE HCL 25 MG/ML IJ SOLN: 6.2500 mg | INTRAMUSCULAR | Status: DC | PRN

## 2019-08-22 MED ORDER — ROPIVACAINE HCL 5 MG/ML IJ SOLN
INTRAMUSCULAR | Status: DC | PRN
  Administered 2019-08-22: 50 mL via PERINEURAL

## 2019-08-22 MED ORDER — ONDANSETRON HCL 4 MG/2ML IJ SOLN
INTRAMUSCULAR | Status: AC
  Filled 2019-08-22: qty 2

## 2019-08-22 MED ORDER — ASPIRIN 325 MG PO TABS: 325.0000 mg | ORAL_TABLET | Freq: Every day | ORAL | 0 refills | Status: AC

## 2019-08-22 MED ORDER — GLYCOPYRROLATE PF 0.2 MG/ML IJ SOSY
PREFILLED_SYRINGE | INTRAMUSCULAR | Status: AC
  Filled 2019-08-22: qty 1

## 2019-08-22 MED ORDER — CEFAZOLIN SODIUM-DEXTROSE 2-4 GM/100ML-% IV SOLN
2.0000 g | INTRAVENOUS | Status: AC
  Administered 2019-08-22: 2 g via INTRAVENOUS

## 2019-08-22 MED ORDER — PROMETHAZINE HCL 25 MG/ML IJ SOLN: 6.2500 mg | INTRAMUSCULAR | Status: DC | PRN

## 2019-08-22 MED ORDER — FENTANYL CITRATE (PF) 100 MCG/2ML IJ SOLN
INTRAMUSCULAR | Status: AC
  Filled 2019-08-22: qty 2

## 2019-08-22 MED ORDER — LIDOCAINE HCL (CARDIAC) PF 100 MG/5ML IV SOSY
PREFILLED_SYRINGE | INTRAVENOUS | Status: DC | PRN
  Administered 2019-08-22: 60 mg via INTRATRACHEAL

## 2019-08-22 MED ORDER — OXYCODONE HCL 5 MG/5ML PO SOLN: 5.0000 mg | Freq: Once | ORAL | Status: DC | PRN

## 2019-08-22 MED ORDER — CEFAZOLIN SODIUM-DEXTROSE 2-4 GM/100ML-% IV SOLN
INTRAVENOUS | Status: AC
  Filled 2019-08-22: qty 100

## 2019-08-22 MED ORDER — DEXAMETHASONE SODIUM PHOSPHATE 10 MG/ML IJ SOLN
INTRAMUSCULAR | Status: AC
  Filled 2019-08-22: qty 1

## 2019-08-22 MED ORDER — PHENYLEPHRINE HCL (PRESSORS) 10 MG/ML IV SOLN
INTRAVENOUS | Status: DC | PRN
  Administered 2019-08-22 (×2): 80 ug via INTRAVENOUS

## 2019-08-22 MED ORDER — OXYCODONE HCL 5 MG PO TABS: 5.0000 mg | ORAL_TABLET | ORAL | 0 refills | Status: AC | PRN

## 2019-08-22 MED ORDER — OXYCODONE HCL 5 MG PO TABS: 5.0000 mg | ORAL_TABLET | Freq: Once | ORAL | Status: DC | PRN

## 2019-08-22 MED ORDER — MIDAZOLAM HCL 2 MG/2ML IJ SOLN
INTRAMUSCULAR | Status: AC
  Filled 2019-08-22: qty 2

## 2019-08-22 MED ORDER — PROPOFOL 10 MG/ML IV BOLUS
INTRAVENOUS | Status: DC | PRN
  Administered 2019-08-22: 150 ug via INTRAVENOUS
  Administered 2019-08-22: 50 ug via INTRAVENOUS

## 2019-08-22 MED ORDER — DEXAMETHASONE SODIUM PHOSPHATE 10 MG/ML IJ SOLN
INTRAMUSCULAR | Status: DC | PRN
  Administered 2019-08-22: 5 mg via INTRAVENOUS

## 2019-08-22 MED ORDER — HYDROMORPHONE HCL 1 MG/ML IJ SOLN: 0.2500 mg | INTRAMUSCULAR | Status: DC | PRN

## 2019-08-22 MED ORDER — ARTIFICIAL TEARS OPHTHALMIC OINT
TOPICAL_OINTMENT | OPHTHALMIC | Status: AC
  Filled 2019-08-22: qty 3.5

## 2019-08-22 MED ORDER — MIDAZOLAM HCL 2 MG/2ML IJ SOLN
INTRAMUSCULAR | Status: DC | PRN
  Administered 2019-08-22 (×2): 1 mg via INTRAVENOUS

## 2019-08-22 MED ORDER — FENTANYL CITRATE (PF) 100 MCG/2ML IJ SOLN
50.0000 ug | INTRAMUSCULAR | Status: DC | PRN
  Administered 2019-08-22: 50 ug via INTRAVENOUS

## 2019-08-22 MED ORDER — PHENYLEPHRINE 40 MCG/ML (10ML) SYRINGE FOR IV PUSH (FOR BLOOD PRESSURE SUPPORT)
PREFILLED_SYRINGE | INTRAVENOUS | Status: AC
  Filled 2019-08-22: qty 10

## 2019-08-22 MED ORDER — PROPOFOL 10 MG/ML IV BOLUS
INTRAVENOUS | Status: AC
  Filled 2019-08-22: qty 20

## 2019-08-22 MED ORDER — POVIDONE-IODINE 10 % EX SWAB
2.0000 "application " | Freq: Once | CUTANEOUS | Status: AC
  Administered 2019-08-22: 2 via TOPICAL

## 2019-08-22 MED ORDER — LIDOCAINE 2% (20 MG/ML) 5 ML SYRINGE
INTRAMUSCULAR | Status: AC
  Filled 2019-08-22: qty 5

## 2019-08-22 MED ORDER — ONDANSETRON HCL 4 MG/2ML IJ SOLN
INTRAMUSCULAR | Status: DC | PRN
  Administered 2019-08-22: 4 mg via INTRAVENOUS

## 2019-08-22 SURGICAL SUPPLY — 84 items
APL PRP STRL LF DISP 70% ISPRP (MISCELLANEOUS) ×1
APL SKNCLS STERI-STRIP NONHPOA (GAUZE/BANDAGES/DRESSINGS)
BANDAGE ESMARK 6X9 LF (GAUZE/BANDAGES/DRESSINGS) ×1 IMPLANT
BENZOIN TINCTURE PRP APPL 2/3 (GAUZE/BANDAGES/DRESSINGS) IMPLANT
BIT DRILL 2 CANN GRADUATED (BIT) ×1 IMPLANT
BIT DRILL 2.5 CANN LNG (BIT) ×1 IMPLANT
BIT DRILL 2.5 CANN STRL (BIT) ×1 IMPLANT
BIT DRILL 2.6 CANN (BIT) ×1 IMPLANT
BLADE SURG 15 STRL LF DISP TIS (BLADE) ×2 IMPLANT
BLADE SURG 15 STRL SS (BLADE) ×10
BNDG CMPR 9X4 STRL LF SNTH (GAUZE/BANDAGES/DRESSINGS)
BNDG CMPR 9X6 STRL LF SNTH (GAUZE/BANDAGES/DRESSINGS) ×1
BNDG COHESIVE 4X5 TAN STRL (GAUZE/BANDAGES/DRESSINGS) ×1 IMPLANT
BNDG ELASTIC 4X5.8 VLCR STR LF (GAUZE/BANDAGES/DRESSINGS) ×1 IMPLANT
BNDG ELASTIC 6X5.8 VLCR STR LF (GAUZE/BANDAGES/DRESSINGS) ×3 IMPLANT
BNDG ESMARK 4X9 LF (GAUZE/BANDAGES/DRESSINGS) IMPLANT
BNDG ESMARK 6X9 LF (GAUZE/BANDAGES/DRESSINGS) ×2
CANISTER SUCT 1200ML W/VALVE (MISCELLANEOUS) ×1 IMPLANT
CHLORAPREP W/TINT 26 (MISCELLANEOUS) ×2 IMPLANT
COVER BACK TABLE REUSABLE LG (DRAPES) ×1 IMPLANT
COVER MAYO STAND STRL (DRAPES) ×1 IMPLANT
COVER TABLE BACK 60X90 (DRAPES) ×1 IMPLANT
COVER WAND RF STERILE (DRAPES) IMPLANT
CUFF TOURN SGL QUICK 34 (TOURNIQUET CUFF) ×2
CUFF TRNQT CYL 34X4.125X (TOURNIQUET CUFF) ×1 IMPLANT
DECANTER SPIKE VIAL GLASS SM (MISCELLANEOUS) IMPLANT
DRAPE C-ARM 42X72 X-RAY (DRAPES) ×1 IMPLANT
DRAPE C-ARMOR (DRAPES) ×1 IMPLANT
DRAPE EXTREMITY T 121X128X90 (DISPOSABLE) ×2 IMPLANT
DRAPE HALF SHEET 70X43 (DRAPES) ×2 IMPLANT
DRAPE IMP U-DRAPE 54X76 (DRAPES) ×2 IMPLANT
DRAPE OEC MINIVIEW 54X84 (DRAPES) IMPLANT
DRAPE U-SHAPE 47X51 STRL (DRAPES) ×2 IMPLANT
ELECT REM PT RETURN 9FT ADLT (ELECTROSURGICAL) ×2
ELECTRODE REM PT RTRN 9FT ADLT (ELECTROSURGICAL) ×1 IMPLANT
GAUZE SPONGE 4X4 12PLY STRL (GAUZE/BANDAGES/DRESSINGS) ×2 IMPLANT
GAUZE XEROFORM 1X8 LF (GAUZE/BANDAGES/DRESSINGS) ×2 IMPLANT
GLOVE BIOGEL M STRL SZ7.5 (GLOVE) ×2 IMPLANT
GLOVE BIOGEL PI IND STRL 7.0 (GLOVE) IMPLANT
GLOVE BIOGEL PI IND STRL 8 (GLOVE) ×1 IMPLANT
GLOVE BIOGEL PI INDICATOR 7.0 (GLOVE) ×2
GLOVE BIOGEL PI INDICATOR 8 (GLOVE) ×1
GLOVE ECLIPSE 6.5 STRL STRAW (GLOVE) ×2 IMPLANT
GOWN STRL REUS W/ TWL LRG LVL3 (GOWN DISPOSABLE) ×1 IMPLANT
GOWN STRL REUS W/ TWL XL LVL3 (GOWN DISPOSABLE) ×1 IMPLANT
GOWN STRL REUS W/TWL LRG LVL3 (GOWN DISPOSABLE) ×2
GOWN STRL REUS W/TWL XL LVL3 (GOWN DISPOSABLE) ×2
GUIDEWIRE 1.35MM (WIRE) ×4 IMPLANT
K-WIRE PROS .028 4 (WIRE) ×2 IMPLANT
NS IRRIG 1000ML POUR BTL (IV SOLUTION) ×2 IMPLANT
PACK BASIN DAY SURGERY FS (CUSTOM PROCEDURE TRAY) ×2 IMPLANT
PAD CAST 4YDX4 CTTN HI CHSV (CAST SUPPLIES) ×1 IMPLANT
PADDING CAST COTTON 4X4 STRL (CAST SUPPLIES) ×2
PADDING CAST SYNTHETIC 4 (CAST SUPPLIES) ×4
PADDING CAST SYNTHETIC 4X4 STR (CAST SUPPLIES) ×2 IMPLANT
PENCIL SMOKE EVACUATOR (MISCELLANEOUS) ×2 IMPLANT
PLATE 5HOLE LOCKING 91MML (Plate) ×1 IMPLANT
SCREW CANCELLOUS 3X16MM (Screw) ×1 IMPLANT
SCREW CANNULATED 4.0X40MM (Screw) ×2 IMPLANT
SCREW CORT 3.5X40 LP ANKLE (Screw) ×2 IMPLANT
SCREW LOCKING 2.7X10 ANKLE (Screw) ×1 IMPLANT
SCREW LOCKING 2.7X12 ANKLE (Screw) ×3 IMPLANT
SCREW LOCKING 2.7X16MM (Screw) ×1 IMPLANT
SCREW LOW PROFILE 3.5X14 (Screw) ×2 IMPLANT
SCREW NON-LOCKING 3.5X12MM (Screw) ×1 IMPLANT
SLEEVE SCD COMPRESS KNEE MED (MISCELLANEOUS) ×2 IMPLANT
SPLINT FAST PLASTER 5X30 (CAST SUPPLIES) ×20
SPLINT PLASTER CAST FAST 5X30 (CAST SUPPLIES) ×20 IMPLANT
SPONGE LAP 18X18 RF (DISPOSABLE) ×1 IMPLANT
STAPLER VISISTAT (STAPLE) ×1 IMPLANT
STAPLER VISISTAT 35W (STAPLE) IMPLANT
STOCKINETTE 6  STRL (DRAPES) ×1
STOCKINETTE 6 STRL (DRAPES) ×1 IMPLANT
STRIP CLOSURE SKIN 1/2X4 (GAUZE/BANDAGES/DRESSINGS) IMPLANT
SUCTION FRAZIER HANDLE 10FR (MISCELLANEOUS) ×1
SUCTION TUBE FRAZIER 10FR DISP (MISCELLANEOUS) ×1 IMPLANT
SUT ETHILON 3 0 PS 1 (SUTURE) ×2 IMPLANT
SUT MNCRL AB 3-0 PS2 18 (SUTURE) ×3 IMPLANT
SUT PDS AB 2-0 CT2 27 (SUTURE) ×2 IMPLANT
SUT VIC AB 3-0 FS2 27 (SUTURE) IMPLANT
SYR BULB 3OZ (MISCELLANEOUS) ×2 IMPLANT
TOWEL GREEN STERILE FF (TOWEL DISPOSABLE) ×4 IMPLANT
TUBE CONNECTING 20X1/4 (TUBING) ×2 IMPLANT
UNDERPAD 30X36 HEAVY ABSORB (UNDERPADS AND DIAPERS) ×2 IMPLANT

## 2019-08-22 NOTE — Discharge Instructions (Signed)
DR. [facility] POST-OP INSTRUCTIONS   Pain Management 1. The numbing medicine and your leg will last around 18 hours, take a dose of your pain medicine as soon as you feel it wearing off to avoid rebound pain. 2. Keep your foot elevated above heart level.  Make sure that your heel hangs free ('floats'). 3. Take all prescribed medication as directed. 4. If taking narcotic pain medication you may want to use an over-the-counter stool softener to avoid constipation. 5. You may take over-the-counter NSAIDs (ibuprofen, naproxen, etc.) as well as over-the-counter acetaminophen as directed on the packaging as a supplement for your pain and may also use it to wean away from the prescription medication.  Activity ? Non-weightbearing ? Keep splint intact  First Postoperative Visit 1. Your first postop visit will be at least 2 weeks after surgery.  This should be scheduled when you schedule surgery. 2. If you do not have a postoperative visit scheduled please call [phone] to schedule an appointment. 3. At the appointment your incision will be evaluated for suture removal, x-rays will be obtained if necessary.  General Instructions 1. Swelling is very common after foot and ankle surgery.  It often takes 3 months for the foot and ankle to begin to feel comfortable.  Some amount of swelling will persist for 6-12 months. 2. DO NOT change the dressing.  If there is a problem with the dressing (too tight, loose, gets wet, etc.) please contact Dr. Adair's office. 3. DO NOT get the dressing wet.  For showers you can use an over-the-counter cast cover or wrap a washcloth around the top of your dressing and then cover it with a plastic bag and tape it to your leg. 4. DO NOT soak the incision (no tubs, pools, bath, etc.) until you have approval from Dr. Adair.  Contact Dr. Adairs office or go to Emergency Room if: 1. Temperature above 101 F. 2. Increasing pain that is unresponsive to pain  medication or elevation 3. Excessive redness or swelling in your foot 4. Dressing problems - excessive bloody drainage, looseness or tightness, or if dressing gets wet 5. Develop pain, swelling, warmth, or discoloration of your calf   Post Anesthesia Home Care Instructions  Activity: Get plenty of rest for the remainder of the day. A responsible individual must stay with you for 24 hours following the procedure.  For the next 24 hours, DO NOT: -Drive a car -Operate machinery -Drink alcoholic beverages -Take any medication unless instructed by your physician -Make any legal decisions or sign important papers.  Meals: Start with liquid foods such as gelatin or soup. Progress to regular foods as tolerated. Avoid greasy, spicy, heavy foods. If nausea and/or vomiting occur, drink only clear liquids until the nausea and/or vomiting subsides. Call your physician if vomiting continues.  Special Instructions/Symptoms: Your throat may feel dry or sore from the anesthesia or the breathing tube placed in your throat during surgery. If this causes discomfort, gargle with warm salt water. The discomfort should disappear within 24 hours.  If you had a scopolamine patch placed behind your ear for the management of post- operative nausea and/or vomiting:  1. The medication in the patch is effective for 72 hours, after which it should be removed.  Wrap patch in a tissue and discard in the trash. Wash hands thoroughly with soap and water. 2. You may remove the patch earlier than 72 hours if you experience unpleasant side effects which may include dry mouth, dizziness   or visual disturbances. 3. Avoid touching the patch. Wash your hands with soap and water after contact with the patch.     Regional Anesthesia Blocks  1. Numbness or the inability to move the "blocked" extremity may last from 3-48 hours after placement. The length of time depends on the medication injected and your individual response to  the medication. If the numbness is not going away after 48 hours, call your surgeon.  2. The extremity that is blocked will need to be protected until the numbness is gone and the  Strength has returned. Because you cannot feel it, you will need to take extra care to avoid injury. Because it may be weak, you may have difficulty moving it or using it. You may not know what position it is in without looking at it while the block is in effect.  3. For blocks in the legs and feet, returning to weight bearing and walking needs to be done carefully. You will need to wait until the numbness is entirely gone and the strength has returned. You should be able to move your leg and foot normally before you try and bear weight or walk. You will need someone to be with you when you first try to ensure you do not fall and possibly risk injury.  4. Bruising and tenderness at the needle site are common side effects and will resolve in a few days.  5. Persistent numbness or new problems with movement should be communicated to the surgeon or the [facility] ([phone])/  Surgery Center ([phone]).  

## 2019-08-22 NOTE — Op Note (Signed)
Desiree Andrews female 63 y.o. 08/22/2019  PreOperative Diagnosis: Right trimalleolar ankle fracture dislocation Talus fracture  PostOperative Diagnosis: Right trimalleolar ankle fracture dislocation Syndesmotic disruption Talus fracture  PROCEDURE: Open reduction internal fixation of trimalleolar ankle fracture without posterior fixation Open reduction internal fixation of syndesmosis Open reduction of talus fracture   SURGEON: Melony Overly, MD  ASSISTANT: None  ANESTHESIA: General LMA with peripheral nerve blockade  FINDINGS: Displaced and unstable trimalleolar ankle fracture with small free-floating posterior fragment consisting of posterior malleolar cartilaginous portion. Syndesmotic disruption Dorsal talar neck and head fracture  IMPLANTS: Arthrex distal fibular locking plate Syndesmotic screw 4.0 mm partially-threaded cannulated screws  INDICATIONS:63 y.o. female slipped and fell at home sustaining the above fracture.  She was seen in the emergency department where attempted reduction was performed.  Actually went to the emergency department assisted with closed reduction and splinting.  At that time we discussed surgical intervention in the form of open treatment of her fracture with assessment of stability.  A CT scan was performed which demonstrated a dorsal talar neck and head fracture.  We discussed the risk benefits alternatives surgery which include but not limited to wound healing complications, infection, continued pain, development of arthritis, nonunion, malunion and need for further surgery.  We also discussed the perioperative and anesthetic risk which include death.  Understand the postoperative weightbearing restrictions and agreed to comply with this.  PROCEDURE:Patient was identified the preoperative holding area.  The right leg was marked myself.  Consent was signed myself and the patient.  Peripheral nerve block was performed by anesthesia.   Patient was taken to the operative suite and placed supine on the operative table.  General anesthesia was induced out difficulty.  Preoperative antibiotics were given.  Thigh tourniquet was placed on the left thigh and a bump was placed under the left hip. Bone foam was used.  All bony prominences well-padded.  Surgical timeout was performed.  The operative extremity was then elevated and the tourniquet inflated to 250 mmHg.  We began by making a longitudinal incision overlying the medial malleolus fragment.  She had some intra-articular loose fragments in this area and therefore we started here to aid with reduction.  This was taken sharply down through skin and subcutaneous tissue.  Upon entering this area there was tearing of the periosteum and retinacular tissue and there is a large rush of fracture hematoma.  The fracture site was identified and mobilized.  The fracture ends were cleaned of interposed soft tissue.  The ankle joint was inspected and there was a evidence of medial talar dome cartilage damage that was not full-thickness of the cartilage and there was no free flaps.  The posterior malleolar fragment was visualized through this wound through the gapping of the medial malleolar fragment.  There is a free-floating osteochondral fragment that was removed and put on the back table.  Then the remaining area of impaction of the posterior malleolar fragment was disimpacted using a Soil scientist.  Then the remaining free floating osteochondral fragments within the medial gutter of the ankle joint were removed.  Then the osteochondral fragment that was removed and placed in the back table was replaced in appropriate position held provisionally with K wire fixation.  The ankle was reduced.  Then the anterior ankle joint was further inspected and some of the soft tissue and synovial tissue within the anterior quadrant was removed.  Through direct palpation is able to manipulate the dorsal talar fracture  back into appropriate position.  There was some free-floating bony fragments that were removed from the anterior ankle joint.  Then turned our attention to the lateral malleolus.  We made a longitudinal incision overlying the distal fibula.  This was taken sharply down through skin and subcutaneous tissue.  Blunt dissection was used to identify any branch of the superficial peroneal nerve which was identified and protected through the entirety of the case..  The incision was then taken sharply down to bone and the fracture site was identified.  The fracture site was mobilized.  There was significant amount of comminution in this area as well as comminution of the AITFL anteriorly.  The fracture site were cleaned with a rondure and curette of any fracture hematoma and callus formation.  Then the fracture of the fibula was reduced under direct visualization and held provisionally with a lobster claw.  Then fluoroscopy confirmed adequate reduction of the ankle mortise at that time.  Then a combination of locking and nonlocking screws were used. This provided good stability of the distal fibula fracture.  We then turned our attention to the medial malleolus.  There was continued displacement of the medial malleolus. The osteochondral fragment the posterior malleolus was inspected and found to be in a good position.Then the fracture site was mobilized and using a curette and rondure the fracture was cleared out of hematoma and fracture callus.  Then the fracture was reduced and held provisionally with a pointed reduction forcep.  This was done under direct visualization.  Then fluoroscopy confirmed adequate reduction.  2 4.0 mm partially-threaded cannulated screws were placed across the fracture site with good fixation.  Then under fluoroscopy the ankle was stressed and found to be unstable with regard to syndesmotic.  The syndesmotic under direct manipulation was lax.  The syndesmosis was reduced under direct  visualization and held provisionally with a Weber clamp.  Then a fully threaded screw was placed across the syndesmosis and stability was obtained..  The wounds were irrigated and the deep tissue was closed with a 3-0 Monocryl.  The subcuticular tissue was closed with 3-0 Monocryl and the skin with staples.  Xeroform placed on the wounds as well as 4 x 4's and sterile she cotton.  She was placed in a nonweightbearing short leg splint.  She tolerated this well.  There were no complications.  She was awakened from anesthesia and taken recovery in stable condition.  POST OPERATIVE INSTRUCTIONS: Nonweightbearing to right lower extremity Keep splint dry and limb elevated Continue 325 mg aspirin for DVT prophylaxis Call the office with concerns She will follow-up in 2 weeks for splint removal, x-rays of the right ankle nonweightbearing and suture removal if appropriate.  BLOOD LOSS:  less than 50 mL         DRAINS: none         SPECIMEN: none       COMPLICATIONS:  * No complications entered in OR log *         Disposition: PACU - hemodynamically stable.         Condition: stable

## 2019-08-22 NOTE — Anesthesia Preprocedure Evaluation (Signed)
Anesthesia Evaluation  Patient identified by MRN, date of birth, ID band Patient awake    Reviewed: Allergy & Precautions, H&P , NPO status , Patient's Chart, lab work & pertinent test results, reviewed documented beta blocker date and time   History of Anesthesia Complications Negative for: history of anesthetic complications  Airway Mallampati: II  TM Distance: >3 FB Neck ROM: Full    Dental  (+) Teeth Intact, Dental Advisory Given   Pulmonary neg pulmonary ROS, former smoker,    Pulmonary exam normal breath sounds clear to auscultation       Cardiovascular hypertension, Pt. on home beta blockers Normal cardiovascular exam(-) dysrhythmias  Rhythm:Regular Rate:Normal  '03 MUGA: normal perfusion, EF 60-65%   Neuro/Psych  Headaches, PSYCHIATRIC DISORDERS Anxiety Depression    GI/Hepatic GERD  Controlled,(+) Hepatitis -, C  Endo/Other  negative endocrine ROS  Renal/GU negative Renal ROS     Musculoskeletal   Abdominal (+) + obese,   Peds  Hematology   Anesthesia Other Findings   Reproductive/Obstetrics                             Anesthesia Physical  Anesthesia Plan  ASA: II  Anesthesia Plan: General   Post-op Pain Management:  Regional for Post-op pain   Induction: Intravenous  PONV Risk Score and Plan: 3 and Ondansetron, Dexamethasone, Midazolam and Treatment may vary due to age or medical condition  Airway Management Planned: LMA  Additional Equipment:   Intra-op Plan:   Post-operative Plan:   Informed Consent: I have reviewed the patients History and Physical, chart, labs and discussed the procedure including the risks, benefits and alternatives for the proposed anesthesia with the patient or authorized representative who has indicated his/her understanding and acceptance.     Dental advisory given  Plan Discussed with: CRNA and Surgeon  Anesthesia Plan Comments:  (Plan routine monitors, GA- LMA ok)        Anesthesia Quick Evaluation

## 2019-08-22 NOTE — Anesthesia Procedure Notes (Signed)
Anesthesia Regional Block: Adductor canal block   Pre-Anesthetic Checklist: ,, timeout performed, Correct Patient, Correct Site, Correct Laterality, Correct Procedure, Correct Position, site marked, Risks and benefits discussed,  Surgical consent,  Pre-op evaluation,  At surgeon's request and post-op pain management  Laterality: Right  Prep: chloraprep       Needles:  Injection technique: Single-shot  Needle Type: Stimiplex     Needle Length: 9cm  Needle Gauge: 21     Additional Needles:   Procedures:,,,, ultrasound used (permanent image in chart),,,,  Narrative:  Start time: 08/22/2019 10:21 AM End time: 08/22/2019 10:26 AM Injection made incrementally with aspirations every 5 mL.  Performed by: Personally  Anesthesiologist: Lynda Rainwater, MD

## 2019-08-22 NOTE — Anesthesia Postprocedure Evaluation (Signed)
Anesthesia Post Note  Patient: Desiree Andrews  Procedure(s) Performed: OPEN REDUCTION INTERNAL FIXATION (ORIF) RIGHT ANKLE FRACTURE (Right Ankle)     Patient location during evaluation: PACU Anesthesia Type: General Level of consciousness: awake and alert Pain management: pain level controlled Vital Signs Assessment: post-procedure vital signs reviewed and stable Respiratory status: spontaneous breathing, nonlabored ventilation and respiratory function stable Cardiovascular status: blood pressure returned to baseline and stable Postop Assessment: no apparent nausea or vomiting Anesthetic complications: no    Last Vitals:  Vitals:   08/22/19 1300 08/22/19 1315  BP: 107/86 135/69  Pulse: 96 (!) 106  Resp: 20 20  Temp:  36.7 C  SpO2: 93% 98%    Last Pain:  Vitals:   08/22/19 1315  PainSc: 0-No pain                 Lynda Rainwater

## 2019-08-22 NOTE — Anesthesia Procedure Notes (Signed)
Anesthesia Regional Block: Popliteal block   Pre-Anesthetic Checklist: ,, timeout performed, Correct Patient, Correct Site, Correct Laterality, Correct Procedure, Correct Position, site marked, Risks and benefits discussed,  Surgical consent,  Pre-op evaluation,  At surgeon's request and post-op pain management  Laterality: Right  Prep: chloraprep       Needles:  Injection technique: Single-shot  Needle Type: Stimiplex     Needle Length: 9cm  Needle Gauge: 21     Additional Needles:   Procedures:,,,, ultrasound used (permanent image in chart),,,,  Narrative:  Start time: 08/22/2019 10:22 AM End time: 08/22/2019 10:27 AM Injection made incrementally with aspirations every 5 mL.  Performed by: Personally  Anesthesiologist: Lynda Rainwater, MD

## 2019-08-22 NOTE — H&P (Signed)
Desiree Andrews is an 63 y.o. female.   Chief Complaint: Right trimalleolar ankle fracture with talus fracture HPI: Desiree Andrews is here for surgical intervention of her right ankle and talus.  She sustained a trimalleolar ankle fracture dislocation with associated talus fracture on 08/17/2019.  She underwent closed reduction with sedation in the emergency department on that day.  She was placed in a splint.  She is here today for surgical intervention.  She has been maintaining nonweightbearing.  She has pain in the right ankle.  Denies pain elsewhere.  Recent fevers or chills.  Denies symptomatic numbness or tingling.  Past Medical History:  Diagnosis Date  . Allergy   . Anxiety   . Cancer ([facility])    breast-left   . Cardiopulmonary arrest ([facility]) 1991   Postpartum,? Pulmonary thromboembolism  . Depression    off cymbalta now  . Hyperlipidemia   . Hypertension     Past Surgical History:  Procedure Laterality Date  . BREAST LUMPECTOMY  2003   snbx-Left, Dr.Rubin; Radiation & Chemotherapy X3  . BREAST LUMPECTOMY WITH NEEDLE LOCALIZATION Right 11/30/2012   Procedure: BREAST LUMPECTOMY WITH NEEDLE LOCALIZATION;  Surgeon: Marcello Moores A. Cornett, MD;  Location: [facility];  Service: General;  Laterality: Right;  needle localization at [place] 8:00   . CARDIOVASCULAR STRESS TEST  2005   Neg  . COLONOSCOPY  2006   Normal  . WRIST SURGERY  2012   Left, Dr.Rowen    Family History  Problem Relation Age of Onset  . Diabetes Mother   . Hypertension Mother   . Hyperlipidemia Mother   . Stroke Mother 7       02/2009  . Heart attack Maternal Grandmother        in her 34s  . Stomach cancer Maternal Grandfather 63  . Colon cancer Neg Hx   . Rectal cancer Neg Hx   . Colon polyps Neg Hx   . Esophageal cancer Neg Hx    Social History:  reports that she quit smoking about 29 years ago. She has never used smokeless tobacco. She reports current alcohol use of about 14.0  standard drinks of alcohol per week. She reports that she does not use drugs.  Allergies:  Allergies  Allergen Reactions  . Pravastatin     Sleep disturbance    Medications Prior to Admission  Medication Sig Dispense Refill  . amLODipine (NORVASC) 5 MG tablet Take 1 tablet (5 mg total) by mouth daily. (Patient taking differently: Take 5 mg by mouth 2 (two) times daily. ) 90 tablet 1  . amphetamine-dextroamphetamine (ADDERALL) 20 MG tablet Take 10 mg by mouth 2 (two) times daily.   0  . atorvastatin (LIPITOR) 10 MG tablet TAKE 1 TABLET(10 MG) BY MOUTH DAILY (Patient taking differently: Take 10 mg by mouth daily at 12 noon. ) 90 tablet 1  . cetirizine (ZYRTEC) 10 MG tablet Take 10 mg by mouth as needed for allergies.     . fenofibrate (TRICOR) 145 MG tablet TAKE 1 TABLET(145 MG) BY MOUTH DAILY (Patient taking differently: Take 145 mg by mouth daily. ) 90 tablet 1  . HYDROcodone-acetaminophen (NORCO/VICODIN) 5-325 MG tablet Take 1 tablet by mouth every 6 (six) hours as needed for severe pain. 8 tablet 0  . ibuprofen (ADVIL) 600 MG tablet Take 600 mg by mouth every 8 (eight) hours as needed.    . metoprolol tartrate (LOPRESSOR) 50 MG tablet Take 1.5 tablets (75 mg total) by mouth  2 (two) times daily. 270 tablet 3  . traZODone (DESYREL) 50 MG tablet Take 100 mg by mouth at bedtime as needed for sleep.     . betamethasone dipropionate (DIPROLENE) 0.05 % ointment Apply topically 2 (two) times daily. (Patient not taking: Reported on 08/17/2019) 30 g 0    Results for orders placed or performed during the hospital encounter of 08/22/19 (from the past 48 hour(s))  Respiratory Panel by RT PCR (Flu A&B, Covid) -     Status: None   Collection Time: 08/22/19  8:13 AM  Result Value Ref Range   SARS Coronavirus 2 by RT PCR NEGATIVE NEGATIVE    Comment: (NOTE) SARS-CoV-2 target nucleic acids are NOT DETECTED. The SARS-CoV-2 RNA is generally detectable in upper respiratoy specimens during the acute phase  of infection. The lowest concentration of SARS-CoV-2 viral copies this assay can detect is 131 copies/mL. A negative result does not preclude SARS-Cov-2 infection and should not be used as the sole basis for treatment or other patient management decisions. A negative result may occur with  improper specimen collection/handling, submission of specimen other than nasopharyngeal swab, presence of viral mutation(s) within the areas targeted by this assay, and inadequate number of viral copies (<131 copies/mL). A negative result must be combined with clinical observations, patient history, and epidemiological information. The expected result is Negative. Fact Sheet for Patients:  PinkCheek.be Fact Sheet for Healthcare Providers:  GravelBags.it This test is not yet ap proved or cleared by the [place] FDA and  has been authorized for detection and/or diagnosis of SARS-CoV-2 by FDA under an Emergency Use Authorization (EUA). This EUA will remain  in effect (meaning this test can be used) for the duration of the COVID-19 declaration under Section 564(b)(1) of the Act, 21 U.S.C. section 360bbb-3(b)(1), unless the authorization is terminated or revoked sooner.    Influenza A by PCR NEGATIVE NEGATIVE   Influenza B by PCR NEGATIVE NEGATIVE    Comment: (NOTE) The Xpert Xpress SARS-CoV-2/FLU/RSV assay is intended as an aid in  the diagnosis of influenza from Nasopharyngeal swab specimens and  should not be used as a sole basis for treatment. Nasal washings and  aspirates are unacceptable for Xpert Xpress SARS-CoV-2/FLU/RSV  testing. Fact Sheet for Patients: PinkCheek.be Fact Sheet for Healthcare Providers: GravelBags.it This test is not yet approved or cleared by the [place] FDA and  has been authorized for detection and/or diagnosis of SARS-CoV-2 by  FDA under an  Emergency Use Authorization (EUA). This EUA will remain  in effect (meaning this test can be used) for the duration of the  Covid-19 declaration under Section 564(b)(1) of the Act, 21  U.S.C. section 360bbb-3(b)(1), unless the authorization is  terminated or revoked. Performed at [facility], [place] [address] [place], [place] [address]    No results found.  Review of Systems  Constitutional: Negative.   HENT: Negative.   Eyes: Negative.   Respiratory: Negative.   Cardiovascular: Negative.   Gastrointestinal: Negative.   Musculoskeletal:       Right ankle pain  Neurological: Negative.   Psychiatric/Behavioral: Negative.     Blood pressure 137/84, pulse 92, temperature (!) 97.5 F (36.4 C), resp. rate 16, height 5\' 3"  (1.6 m), weight 77.1 kg, SpO2 100 %. Physical Exam  Vitals reviewed. Constitutional: She appears well-developed.  HENT:  Head: Normocephalic.  Eyes: Conjunctivae are normal.  Cardiovascular: Normal rate.  Respiratory: Effort normal.  Musculoskeletal:     Cervical back: Neck supple.  Comments: Right ankle in a short leg splint.  Toes exposed are warm and well-perfused.  Endorses sensation over the toes.  She is able to wiggle toes.  No tenderness proximal to the splint.  Neurological: She is alert.  Skin: Skin is warm.  Psychiatric: She has a normal mood and affect.     Assessment/Plan We will proceed with open treatment of her right trimalleolar ankle fracture with possible posterior fixation.  She also has a talus fracture which will be addressed as well.  She may require syndesmotic fixation if it is unstable after fibular fixation.  She understands the risk benefits alternatives surgery which include but not limited to wound healing complications, infection, nonunion, malunion, continued pain and development posttraumatic arthritis.  She also understands the perioperative and anesthetic risk which include death.  She understands the postoperative  weightbearing restrictions and agrees to comply.  She wishes to proceed with surgery.  Erle Crocker, MD 08/22/2019, 10:11 AM

## 2019-08-22 NOTE — Anesthesia Procedure Notes (Signed)
Procedure Name: LMA Insertion Date/Time: 08/22/2019 10:45 AM Performed by: Collier Bullock, CRNA Pre-anesthesia Checklist: Patient identified, Emergency Drugs available, Suction available and Patient being monitored Patient Re-evaluated:Patient Re-evaluated prior to induction Oxygen Delivery Method: Circle system utilized Preoxygenation: Pre-oxygenation with 100% oxygen Induction Type: IV induction LMA: LMA inserted LMA Size: 4.0 Tube size: 4.0 mm Number of attempts: 1 Placement Confirmation: positive ETCO2 and breath sounds checked- equal and bilateral Tube secured with: Tape Dental Injury: Teeth and Oropharynx as per pre-operative assessment

## 2019-08-22 NOTE — Progress Notes (Signed)
Assisted Dr. Sabra Heck with right, ultrasound guided, popliteal, adductor canal block. Side rails up, monitors on throughout procedure. See vital signs in flow sheet. Tolerated Procedure well.

## 2019-08-22 NOTE — Transfer of Care (Signed)
Immediate Anesthesia Transfer of Care Note  Patient: Desiree Andrews  Procedure(s) Performed: OPEN REDUCTION INTERNAL FIXATION (ORIF) RIGHT ANKLE FRACTURE (Right Ankle)  Patient Location: PACU  Anesthesia Type:GA combined with regional for post-op pain  Level of Consciousness: awake, alert , oriented and patient cooperative  Airway & Oxygen Therapy: Patient Spontanous Breathing and Patient connected to face mask oxygen  Post-op Assessment: Report given to RN and Post -op Vital signs reviewed and stable  Post vital signs: Reviewed and stable  Last Vitals:  Vitals Value Taken Time  BP 106/65 08/22/19 1230  Temp 36.9 C 08/22/19 1230  Pulse 88 08/22/19 1236  Resp 14 08/22/19 1236  SpO2 100 % 08/22/19 1236  Vitals shown include unvalidated device data.  Last Pain:  Vitals:   08/22/19 1230  PainSc: 0-No pain         Complications: No apparent anesthesia complications

## 2019-09-06 ENCOUNTER — Other Ambulatory Visit: Payer: Self-pay | Admitting: Internal Medicine

## 2019-09-08 DIAGNOSIS — S82841A Displaced bimalleolar fracture of right lower leg, initial encounter for closed fracture: Secondary | ICD-10-CM | POA: Diagnosis not present

## 2019-09-20 ENCOUNTER — Other Ambulatory Visit: Payer: Self-pay | Admitting: Internal Medicine

## 2019-09-28 ENCOUNTER — Telehealth: Payer: Self-pay

## 2019-09-28 NOTE — Telephone Encounter (Signed)
Patient calling and states that she is needing a new prescription for amLODipine (NORVASC) 5 MG tablet States that she is taking this medication 2x a day and needs the new prescription to show that. She is running out of medication before the time frame is up because the prescription says to take it 1x a day. States that insurance will not cover a refill until the end of this month. Please advise.   Jim Hogg, [place] [facility] [place]

## 2019-09-28 NOTE — Telephone Encounter (Signed)
Spoke with pt to clarify medication. She is only supposed to be taking 5 mg of amlodipine total a day. Pt thought that she was taking 2.5 mg and was taking 2 of the 5 mg tablets which is why pharmacy will not fill. Pt will start taking amlodipine once daily. She has not been monitoring her BP at home which was encouraged.

## 2019-10-10 ENCOUNTER — Other Ambulatory Visit: Payer: BC Managed Care – PPO

## 2019-10-10 ENCOUNTER — Ambulatory Visit: Payer: BC Managed Care – PPO

## 2019-10-13 ENCOUNTER — Ambulatory Visit: Payer: BC Managed Care – PPO | Admitting: Internal Medicine

## 2019-10-18 ENCOUNTER — Other Ambulatory Visit: Payer: Self-pay | Admitting: Internal Medicine

## 2019-10-20 DIAGNOSIS — S82841A Displaced bimalleolar fracture of right lower leg, initial encounter for closed fracture: Secondary | ICD-10-CM | POA: Diagnosis not present

## 2019-10-26 DIAGNOSIS — M25671 Stiffness of right ankle, not elsewhere classified: Secondary | ICD-10-CM | POA: Diagnosis not present

## 2019-10-26 DIAGNOSIS — S82841D Displaced bimalleolar fracture of right lower leg, subsequent encounter for closed fracture with routine healing: Secondary | ICD-10-CM | POA: Diagnosis not present

## 2019-10-27 ENCOUNTER — Telehealth: Payer: Self-pay | Admitting: Internal Medicine

## 2019-10-27 NOTE — Progress Notes (Signed)
  Chronic Care Management   Outreach Note  10/27/2019 Name: Desiree Andrews MRN: [id number] DOB: 1956-07-09  Referred by: [name], MD Reason for referral : No chief complaint on file.   An unsuccessful telephone outreach was attempted today. The patient was referred to the pharmacist for assistance with care management and care coordination.   Follow Up Plan:   [name] [employer] Scheduler

## 2019-10-27 NOTE — Chronic Care Management (AMB) (Signed)
  Chronic Care Management   Note  10/27/2019 Name: Desiree Andrews MRN: [id number] DOB: Mar 09, 1956  Desiree Andrews is a 64 y.o. year old female who is a primary care patient of Burns, Claudina Lick, MD. I reached out to Desiree Andrews by phone today in response to a referral sent by Desiree Andrews's PCP, Quay Burow, Claudina Lick, MD.   Desiree Andrews was given information about Chronic Care Management services today including:  1. CCM service includes personalized support from designated clinical staff supervised by her physician, including individualized plan of care and coordination with other care providers 2. 24/7 contact phone numbers for assistance for urgent and routine care needs. 3. Service will only be billed when office clinical staff spend 20 minutes or more in a month to coordinate care. 4. Only one practitioner may furnish and bill the service in a calendar month. 5. The patient may stop CCM services at any time (effective at the end of the month) by phone call to the office staff.   Patient agreed to services and verbal consent obtained.   Follow up plan:   [name] [employer] Scheduler

## 2019-11-06 DIAGNOSIS — M25671 Stiffness of right ankle, not elsewhere classified: Secondary | ICD-10-CM | POA: Diagnosis not present

## 2019-11-06 DIAGNOSIS — S82841D Displaced bimalleolar fracture of right lower leg, subsequent encounter for closed fracture with routine healing: Secondary | ICD-10-CM | POA: Diagnosis not present

## 2019-11-09 NOTE — Chronic Care Management (AMB) (Signed)
Chronic Care Management Pharmacy  Name: Desiree Andrews  MRN: [id number] DOB: Jan 30, 1956   Chief Complaint/ HPI  Desiree Andrews,  64 y.o. , female presents for their Initial CCM visit with the clinical pharmacist via telephone due to COVID-19 Pandemic.  PCP : [name], MD  Their chronic conditions include: HTN, HLD, prediabetes, osteopenia, hx breast cancer  Office Visits: 07/24/19 Dr Quay Burow OV: acute visit for R foot swelling, injury in August, no evidence of fracture. Rec'd symptomatic treatment w/ compression and elevation.  04/11/19 Dr Quay Burow OV: BP, HLD, prediabetes stable. Advised reduce alcohol intake  03/14/19 Dr Quay Burow OV: BP uncontrolled 162/96, increased amlodipine to 5 mg daily and metoprolol to 75 mg BID. May increase trazodone to 100 mg HS. Encouraged daily fenofibrate compliance. Reduce alcohol intake.   Consult Visit: 08/17/19 ED visit- R ankle fracture d/t fall, dc'd with crutches and pain meds, f/u with ortho.  Medications: Outpatient Encounter Medications as of 11/13/2019  Medication Sig  . amLODipine (NORVASC) 5 MG tablet Take 1 tablet (5 mg total) by mouth daily.  Marland Kitchen atorvastatin (LIPITOR) 10 MG tablet TAKE 1 TABLET(10 MG) BY MOUTH DAILY  . cetirizine (ZYRTEC) 10 MG tablet Take 10 mg by mouth as needed for allergies.   . clobetasol cream (TEMOVATE) [id number] % Apply 1 application topically 2 (two) times daily.  . fenofibrate (TRICOR) 145 MG tablet TAKE 1 TABLET(145 MG) BY MOUTH DAILY  . ibuprofen (ADVIL) 600 MG tablet Take 600 mg by mouth every 8 (eight) hours as needed.  . metoprolol tartrate (LOPRESSOR) 50 MG tablet TAKE 1 TABLET(50 MG) BY MOUTH TWICE DAILY (Patient taking differently: Take 75 mg by mouth 2 (two) times daily. Take 1.5 tablets twice a day)  . traZODone (DESYREL) 50 MG tablet Take 100 mg by mouth at bedtime as needed for sleep.   Marland Kitchen amphetamine-dextroamphetamine (ADDERALL) 20 MG tablet Take 10 mg by mouth 2 (two) times daily.   .  betamethasone dipropionate (DIPROLENE) 0.05 % ointment Apply topically 2 (two) times daily. (Patient not taking: Reported on 08/17/2019)   No facility-administered encounter medications on file as of 11/13/2019.     Current Diagnosis/Assessment:  Goals Addressed            This Visit's Progress   . Pharmacy Care Plan       CARE PLAN ENTRY  Current Barriers:  . Chronic Disease Management support, education, and care coordination needs related to HTN and HLD  Pharmacist Clinical Goal(s):  Marland Kitchen Maintain BP < 130/80 . Maintain LDL < 130 . Ensure safety, efficacy, and affordability of medications  Interventions: . Comprehensive medication review performed. . Discussed diet and lifestyle changes to improve cholesterol and blood pressure o Reduce alcohol intake o Substitute healthier alternatives o Try chair exercises while foot heals . Updated medication instructions for metoprolol tartrate to avoid running out early . Requested clobetasol cream from PCP  Patient Self Care Activities:  . Self administers medications as prescribed, Calls pharmacy for medication refills, and Calls provider office for new concerns or questions  Initial goal documentation       Hypertension   Office blood pressures are  BP Readings from Last 3 Encounters:  08/22/19 135/69  08/17/19 121/82  07/24/19 140/76   Lab Results  Component Value Date   CREATININE 0.82 08/17/2019   CREATININE 0.74 03/02/2019   CREATININE 0.71 08/26/2018    Patient has failed these meds in the past: n/a Patient is currently controlled on the following medications:  amlodipine 5 mg daily, metoprolol tartrate 75 mg BID  Patient checks BP at home infrequently   Patient home BP readings are ranging: 140s/90s in AM  We discussed diet and exercise extensively, blood pressure goals, consequences of consistently elevated BP, importance of monitoring BP at home. Counseled on proper BP technique.  Plan   Continue  current medications and control with diet and exercise  Recommended to monitor BP at home a few days a week and keep log   Hyperlipidemia   Lipid Panel     Component Value Date/Time   CHOL 253 (H) 03/02/2019 0824   TRIG (H) 03/02/2019 0824    503.0 Triglyceride is over 400; calculations on Lipids are invalid.   HDL 44.30 03/02/2019 0824   CHOLHDL 6 03/02/2019 0824   VLDL 72.4 (H) 08/30/2017 0917   LDLDIRECT 120.0 03/02/2019 0824     The 10-year ASCVD risk score Mikey Bussing DC Jr., et al., 2013) is: 10.2%   Values used to calculate the score:     Age: 52 years     Sex: Female     Is Non-Hispanic African American: No     Diabetic: No     Tobacco smoker: No     Systolic Blood Pressure: [id number] mmHg     Is BP treated: Yes     HDL Cholesterol: 44.3 mg/dL     Total Cholesterol: 253 mg/dL   Patient has failed these meds in past: pravastatin Patient is currently uncontrolled on the following medications: atorvastatin 10 mg daily, fenofibrate 145 mg daily,   Hx noncompliance with fenofibrate, supposed to be taking daily now.   We discussed:  diet and exercise extensively, discussed role of cholesterol in the body, mechanism of of statin, cholesterol content in foods. Pt will try to cut back on alcohol.  Starting PT this week. Discussed chair exercises.   Plan  Continue current medications and control with diet and exercise  Recommended chair exercises to complement physical therapy   Insomnia   Patient has failed these meds in past: n/a Patient is currently controlled on the following medications: trazodone 50 mg HS prn  We discussed:  Pt asked if trazodone treats depression, discussed dosages for treating depression vs insomnia. Pt does not take trazodone every night, only after not sleeping for a few nights. Encouraged pt to take trazodone more consistently if it helps her sleep, discussed relatively mild side effects of trazodone vs other sleep aids like zolpidem that have more  issues.  Plan  Continue current medications    Pain/Foot fracture   Patient has failed these meds in past: oxycodone, hydrocodone Patient is currently controlled on the following medications: ibuprofen 600 mg q8h prn  We discussed: pt takes PRN for "discomfort", healing process has been longer than she would like but she is using a walker now and starting PT this week.   Plan  Continue current medications   ADD (no official dx on file)   Patient has failed these meds in past: n/a Patient is currently controlled on the following medications: Amphetamine-dextroamphetamine 10 mg (1/2 of 20 mg) daily  We discussed:  Pt reports she is taking this to help her focus. Discussed side effects of stimulants including elevated BP and HR, insomnia. Will continue to monitor for side effects.  Plan  Continue current medications   Health Maintenance   Patient is currently controlled on the following medications: cetirizine 10 mg daily prn, multivitamin, calcium   We discussed:  Pt is satisifed with  OTC regimen  Plan  Continue current medications    Medication Management   Pt uses [facility] for all medications Does not use pill box Pt endorses 100% compliance   We discussed:  [facility] uses text message reminders for refills. Pt calls pharmacy when she needs refills as well. We discussed Medicare prescription coverage and coverage gap, pt is unlikely to enter coverage gap with her current meds but advised pt to be aware in future if she is prescribed Brand-name/expensive meds.  Plan  Continue current med management strategy    Follow up: 3 month phone visit  Charlene Brooke, PharmD Clinical Pharmacist [facility] [phone]

## 2019-11-13 ENCOUNTER — Ambulatory Visit: Payer: Medicare Other | Admitting: Pharmacist

## 2019-11-13 ENCOUNTER — Other Ambulatory Visit: Payer: Self-pay

## 2019-11-13 ENCOUNTER — Other Ambulatory Visit: Payer: Self-pay | Admitting: Internal Medicine

## 2019-11-13 DIAGNOSIS — S82841D Displaced bimalleolar fracture of right lower leg, subsequent encounter for closed fracture with routine healing: Secondary | ICD-10-CM | POA: Diagnosis not present

## 2019-11-13 DIAGNOSIS — I10 Essential (primary) hypertension: Secondary | ICD-10-CM

## 2019-11-13 DIAGNOSIS — E785 Hyperlipidemia, unspecified: Secondary | ICD-10-CM

## 2019-11-13 DIAGNOSIS — G479 Sleep disorder, unspecified: Secondary | ICD-10-CM

## 2019-11-13 DIAGNOSIS — M25671 Stiffness of right ankle, not elsewhere classified: Secondary | ICD-10-CM | POA: Diagnosis not present

## 2019-11-13 MED ORDER — METOPROLOL TARTRATE 50 MG PO TABS: 75.0000 mg | ORAL_TABLET | Freq: Two times a day (BID) | ORAL | 1 refills | Status: DC

## 2019-11-13 MED ORDER — CLOBETASOL PROPIONATE 0.05 % EX CREA: 1.0000 "application " | TOPICAL_CREAM | Freq: Two times a day (BID) | CUTANEOUS | 2 refills | Status: DC | PRN

## 2019-11-13 NOTE — Progress Notes (Signed)
  I have reviewed this encounter including the documentation in this note and/or discussed this patient with the care management provider. I am certifying that I agree with the content of this note as supervising physician.  [name], MD   11/13/2019

## 2019-11-13 NOTE — Patient Instructions (Addendum)
Visit Information  Thank you for meeting with me to discuss your medications! I look forward to working with you to achieve your health care goals. Below is a summary of what we talked about during the visit:  Goals Addressed            This Visit's Progress   . Pharmacy Care Plan       CARE PLAN ENTRY  Current Barriers:  . Chronic Disease Management support, education, and care coordination needs related to HTN and HLD  Pharmacist Clinical Goal(s):  Marland Kitchen Maintain BP < 130/80 . Maintain LDL < 130 . Ensure safety, efficacy, and affordability of medications  Interventions: . Comprehensive medication review performed. . Discussed diet and lifestyle changes to improve cholesterol and blood pressure o Reduce alcohol intake o Substitute healthier alternatives o Try chair exercises while foot heals . Updated medication instructions for metoprolol tartrate to avoid running out early . Requested clobetasol cream from PCP . Consider GoodRx coupon for generic Adderall if not covered by insurance  Patient Self Care Activities:  . Self administers medications as prescribed, Calls pharmacy for medication refills, and Calls provider office for new concerns or questions  Initial goal documentation       Ms. Ingber was given information about Chronic Care Management services today including:  1. CCM service includes personalized support from designated clinical staff supervised by her physician, including individualized plan of care and coordination with other care providers 2. 24/7 contact phone numbers for assistance for urgent and routine care needs. 3. Standard insurance, coinsurance, copays and deductibles apply for chronic care management only during months in which we provide at least 20 minutes of these services. Most insurances cover these services at 100%, however patients may be responsible for any copay, coinsurance and/or deductible if applicable. This service may help you avoid  the need for more expensive face-to-face services. 4. Only one practitioner may furnish and bill the service in a calendar month. 5. The patient may stop CCM services at any time (effective at the end of the month) by phone call to the office staff.  Patient agreed to services and verbal consent obtained.   The patient verbalized understanding of instructions provided today and agreed to receive a mailed copy of patient instruction and/or educational materials. Telephone follow up appointment with pharmacy team member scheduled for:  02/22/20 @ 1:00pm  Charlene Brooke, PharmD Clinical Pharmacist [facility] [phone]  Exercises To Do While Sitting  Exercises that you do while sitting (chair exercises) can give you many of the same benefits as full exercise. Benefits include strengthening your heart, burning calories, and keeping muscles and joints healthy. Exercise can also improve your mood and help with depression and anxiety. You may benefit from chair exercises if you are unable to do standing exercises because of:  Diabetic foot pain.  Obesity.  Illness.  Arthritis.  Recovery from surgery or injury.  Breathing problems.  Balance problems.  Another type of disability. Before starting chair exercises, check with your health care provider or a physical therapist to find out how much exercise you can tolerate and which exercises are safe for you. If your health care provider approves:  Start out slowly and build up over time. Aim to work up to about 10-20 minutes for each exercise session.  Make exercise part of your daily routine.  Drink water when you exercise. Do not wait until you are thirsty. Drink every 10-15 minutes.  Stop exercising right  away if you have pain, nausea, shortness of breath, or dizziness.  If you are exercising in a wheelchair, make sure to lock the wheels.  Ask your health care provider whether you can do tai chi or  yoga. Many positions in these mind-body exercises can be modified to do while seated. Warm-up Before starting other exercises: 1. Sit up as straight as you can. Have your knees bent at 90 degrees, which is the shape of the capital letter "L." Keep your feet flat on the floor. 2. Sit at the front edge of your chair, if you can. 3. Pull in (tighten) the muscles in your abdomen and stretch your spine and neck as straight as you can. Hold this position for a few minutes. 4. Breathe in and out evenly. Try to concentrate on your breathing, and relax your mind. Stretching Exercise A: Arm stretch 1. Hold your arms out straight in front of your body. 2. Bend your hands at the wrist with your fingers pointing up, as if signaling someone to stop. Notice the slight tension in your forearms as you hold the position. 3. Keeping your arms out and your hands bent, rotate your hands outward as far as you can and hold this stretch. Aim to have your thumbs pointing up and your pinkie fingers pointing down. Slowly repeat arm stretches for one minute as tolerated. Exercise B: Leg stretch 1. If you can move your legs, try to "draw" letters on the floor with the toes of your foot. Write your name with one foot. 2. Write your name with the toes of your other foot. Slowly repeat the movements for one minute as tolerated. Exercise C: Reach for the sky 1. Reach your hands as far over your head as you can to stretch your spine. 2. Move your hands and arms as if you are climbing a rope. Slowly repeat the movements for one minute as tolerated. Range of motion exercises Exercise A: Shoulder roll 1. Let your arms hang loosely at your sides. 2. Lift just your shoulders up toward your ears, then let them relax back down. 3. When your shoulders feel loose, rotate your shoulders in backward and forward circles. Do shoulder rolls slowly for one minute as tolerated. Exercise B: March in place 1. As if you are marching, pump  your arms and lift your legs up and down. Lift your knees as high as you can. ? If you are unable to lift your knees, just pump your arms and move your ankles and feet up and down. March in place for one minute as tolerated. Exercise C: Seated jumping jacks 1. Let your arms hang down straight. 2. Keeping your arms straight, lift them up over your head. Aim to point your fingers to the ceiling. 3. While you lift your arms, straighten your legs and slide your heels along the floor to your sides, as wide as you can. 4. As you bring your arms back down to your sides, slide your legs back together. ? If you are unable to use your legs, just move your arms. Slowly repeat seated jumping jacks for one minute as tolerated. Strengthening exercises Exercise A: Shoulder squeeze 1. Hold your arms straight out from your body to your sides, with your elbows bent and your fists pointed at the ceiling. 2. Keeping your arms in the bent position, move them forward so your elbows and forearms meet in front of your face. 3. Open your arms back out as wide as you can with  your elbows still bent, until you feel your shoulder blades squeezing together. Hold for 5 seconds. Slowly repeat the movements forward and backward for one minute as tolerated. Contact a health care provider if you:  Had to stop exercising due to any of the following: ? Pain. ? Nausea. ? Shortness of breath. ? Dizziness. ? Fatigue.  Have significant pain or soreness after exercising. Get help right away if you have:  Chest pain.  Difficulty breathing. These symptoms may represent a serious problem that is an emergency. Do not wait to see if the symptoms will go away. Get medical help right away. Call your local emergency services (911 in the [place] Do not drive yourself to the hospital. This information is not intended to replace advice given to you by your health care provider. Make sure you discuss any questions you have with your  health care provider. Document Revised: 12/01/2018 Document Reviewed: 06/23/2017 Elsevier Patient Education  2020 [employer].

## 2019-11-15 DIAGNOSIS — M25671 Stiffness of right ankle, not elsewhere classified: Secondary | ICD-10-CM | POA: Diagnosis not present

## 2019-11-15 DIAGNOSIS — S82841D Displaced bimalleolar fracture of right lower leg, subsequent encounter for closed fracture with routine healing: Secondary | ICD-10-CM | POA: Diagnosis not present

## 2019-11-16 NOTE — Addendum Note (Signed)
Addended by: Karle Barr on: 11/16/2019 05:25 AM   Modules accepted: Orders

## 2019-11-20 DIAGNOSIS — M25671 Stiffness of right ankle, not elsewhere classified: Secondary | ICD-10-CM | POA: Diagnosis not present

## 2019-11-20 DIAGNOSIS — S82841D Displaced bimalleolar fracture of right lower leg, subsequent encounter for closed fracture with routine healing: Secondary | ICD-10-CM | POA: Diagnosis not present

## 2019-11-21 ENCOUNTER — Ambulatory Visit: Payer: Medicare Other

## 2019-11-21 ENCOUNTER — Other Ambulatory Visit: Payer: Self-pay

## 2019-11-22 DIAGNOSIS — M25671 Stiffness of right ankle, not elsewhere classified: Secondary | ICD-10-CM | POA: Diagnosis not present

## 2019-11-22 DIAGNOSIS — S82841D Displaced bimalleolar fracture of right lower leg, subsequent encounter for closed fracture with routine healing: Secondary | ICD-10-CM | POA: Diagnosis not present

## 2019-11-23 ENCOUNTER — Other Ambulatory Visit: Payer: Self-pay

## 2019-11-23 MED ORDER — AMLODIPINE BESYLATE 5 MG PO TABS: 5.0000 mg | ORAL_TABLET | Freq: Every day | ORAL | 1 refills | Status: DC

## 2019-11-25 ENCOUNTER — Other Ambulatory Visit: Payer: Self-pay | Admitting: Internal Medicine

## 2019-11-27 ENCOUNTER — Ambulatory Visit: Payer: Medicare Other | Admitting: Internal Medicine

## 2019-11-27 DIAGNOSIS — M25671 Stiffness of right ankle, not elsewhere classified: Secondary | ICD-10-CM | POA: Diagnosis not present

## 2019-11-27 DIAGNOSIS — S82841D Displaced bimalleolar fracture of right lower leg, subsequent encounter for closed fracture with routine healing: Secondary | ICD-10-CM | POA: Diagnosis not present

## 2019-12-01 DIAGNOSIS — M25671 Stiffness of right ankle, not elsewhere classified: Secondary | ICD-10-CM | POA: Diagnosis not present

## 2019-12-01 DIAGNOSIS — M25672 Stiffness of left ankle, not elsewhere classified: Secondary | ICD-10-CM | POA: Diagnosis not present

## 2019-12-04 DIAGNOSIS — S82841D Displaced bimalleolar fracture of right lower leg, subsequent encounter for closed fracture with routine healing: Secondary | ICD-10-CM | POA: Diagnosis not present

## 2019-12-04 DIAGNOSIS — M25671 Stiffness of right ankle, not elsewhere classified: Secondary | ICD-10-CM | POA: Diagnosis not present

## 2019-12-08 DIAGNOSIS — S82841D Displaced bimalleolar fracture of right lower leg, subsequent encounter for closed fracture with routine healing: Secondary | ICD-10-CM | POA: Diagnosis not present

## 2019-12-08 DIAGNOSIS — M25671 Stiffness of right ankle, not elsewhere classified: Secondary | ICD-10-CM | POA: Diagnosis not present

## 2019-12-19 DIAGNOSIS — S82841D Displaced bimalleolar fracture of right lower leg, subsequent encounter for closed fracture with routine healing: Secondary | ICD-10-CM | POA: Diagnosis not present

## 2019-12-19 DIAGNOSIS — M25671 Stiffness of right ankle, not elsewhere classified: Secondary | ICD-10-CM | POA: Diagnosis not present

## 2019-12-20 ENCOUNTER — Other Ambulatory Visit: Payer: Self-pay

## 2019-12-20 ENCOUNTER — Ambulatory Visit: Payer: Medicare Other | Admitting: Internal Medicine

## 2019-12-20 ENCOUNTER — Encounter: Payer: Self-pay | Admitting: Internal Medicine

## 2019-12-20 VITALS — BP 158/70 | HR 72 | Temp 98.7°F | Resp 16 | Ht 63.0 in | Wt 172.4 lb

## 2019-12-20 DIAGNOSIS — R7303 Prediabetes: Secondary | ICD-10-CM | POA: Diagnosis not present

## 2019-12-20 DIAGNOSIS — R600 Localized edema: Secondary | ICD-10-CM | POA: Diagnosis not present

## 2019-12-20 DIAGNOSIS — I10 Essential (primary) hypertension: Secondary | ICD-10-CM | POA: Diagnosis not present

## 2019-12-20 LAB — COMPREHENSIVE METABOLIC PANEL
ALT: 18 U/L (ref 0–35)
AST: 20 U/L (ref 0–37)
Albumin: 4.5 g/dL (ref 3.5–5.2)
Alkaline Phosphatase: 57 U/L (ref 39–117)
BUN: 17 mg/dL (ref 6–23)
CO2: 28 mEq/L (ref 19–32)
Calcium: 9.7 mg/dL (ref 8.4–10.5)
Chloride: 102 mEq/L (ref 96–112)
Creatinine, Ser: 0.98 mg/dL (ref 0.40–1.20)
GFR: 56.84 mL/min — ABNORMAL LOW (ref >60.00)
Glucose, Bld: 183 mg/dL — ABNORMAL HIGH (ref 70–99)
Potassium: 4.1 mEq/L (ref 3.5–5.1)
Sodium: 137 mEq/L (ref 135–145)
Total Bilirubin: 0.3 mg/dL (ref 0.2–1.2)
Total Protein: 7.2 g/dL (ref 6.0–8.3)

## 2019-12-20 MED ORDER — HYDROCHLOROTHIAZIDE 25 MG PO TABS: 25.0000 mg | ORAL_TABLET | Freq: Every day | ORAL | 0 refills | Status: DC

## 2019-12-20 NOTE — Assessment & Plan Note (Signed)
Chronic She has not been eating as good recently because she was living with her daughter after she broke her ankle Has been more thirsty, but states she does not always drink enough fluids Check A1c adopting a more healthy diet

## 2019-12-20 NOTE — Patient Instructions (Addendum)
  Blood work was ordered.    An [place] was ordered to rule out a blood clot.  Someone will call you to schedule.   Medications reviewed and updated.  Changes include :     Decrease amlodipine to 2.5 mg daily Start hctz  25 daily  Your prescription(s) have been submitted to your pharmacy. Please take as directed and contact our office if you believe you are having problem(s) with the medication(s).   Elevate your legs when sitting Wear compression socks daily - take off at bedtime Decrease your sodium intake Walk as much as possible  Please follow up in May as scheduled

## 2019-12-20 NOTE — Assessment & Plan Note (Signed)
Chronic Blood pressure elevated here today, with some of that may be related to anxiety She does not monitor her blood pressure at home, but can start and she will We will decrease amlodipine to 2.5 mg daily and start hydrochlorothiazide 25 mg daily-hopefully this will help with the leg swelling and maintain good blood pressure control We can adjust if necessary Seen

## 2019-12-20 NOTE — Progress Notes (Signed)
Subjective:    Patient ID: Desiree Andrews, female    DOB: 02/04/1956, 64 y.o.   MRN: [id number]  HPI The patient is here for an acute visit.   She broke her right ankle in December.  She is doing PT and still recovering.  It is numb and stiff.  She can walk, but not far. The right foot and ankle has been swollen since the injury.   She noticed the left ankle/foot about one month ago.  At night they can get very large.  She has not had swelling prior to this.  She has been wearing compression socks at times and that helps. Her swelling gets better over night.    She has mild SOB which she feels is from not doing anything for a while and being out of shape.  She feels dehydrated.  Drinking more helps.  She often does not drink enough fluids.  She had been living with her daughter up until 3 weeks ago.  She was eating more sodium - processed meats and frozen meals.  She thinks she is probably still eating too much sodium.  She was concerned why she was having swelling when she has never had it before.  She was anxious to come here today.  She does not check her blood pressure on a regular basis.      Medications and allergies reviewed with patient and updated if appropriate.  Patient Active Problem List   Diagnosis Date Noted  . Bilateral leg edema 12/20/2019  . Ankle swelling, right 04/11/2019  . Alcohol abuse 03/14/2019  . Lichen sclerosus 123XX123  . Prediabetes 02/17/2017  . Obese 02/17/2017  . 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 PARESTHESIA, HANDS 05/20/2009  . Vitamin D deficiency 12/18/2008  . Osteopenia 12/18/2008  . BREAST CANCER, HX OF 12/18/2008  . Dyslipidemia 10/19/2007  . Hepatitis C antibody test positive 10/19/2007  . Essential hypertension 06/07/2007    Current Outpatient Medications on File Prior to Visit  Medication Sig Dispense Refill  . amLODipine (NORVASC) 5 MG tablet TAKE 1  TABLET(5 MG) BY MOUTH DAILY 90 tablet 0  . amphetamine-dextroamphetamine (ADDERALL) 20 MG tablet Take 10 mg by mouth 2 (two) times daily.   0  . atorvastatin (LIPITOR) 10 MG tablet TAKE 1 TABLET(10 MG) BY MOUTH DAILY 90 tablet 1  . betamethasone dipropionate (DIPROLENE) 0.05 % ointment Apply topically 2 (two) times daily. 30 g 0  . cetirizine (ZYRTEC) 10 MG tablet Take 10 mg by mouth as needed for allergies.     . clobetasol cream (TEMOVATE) [id number] % Apply 1 application topically 2 (two) times daily as needed. 30 g 2  . fenofibrate (TRICOR) 145 MG tablet TAKE 1 TABLET(145 MG) BY MOUTH DAILY 90 tablet 1  . ibuprofen (ADVIL) 600 MG tablet Take 600 mg by mouth every 8 (eight) hours as needed.    . metoprolol tartrate (LOPRESSOR) 50 MG tablet Take 1.5 tablets (75 mg total) by mouth 2 (two) times daily. 270 tablet 1  . traZODone (DESYREL) 50 MG tablet Take 100 mg by mouth at bedtime as needed for sleep.      No current facility-administered medications on file prior to visit.    Past Medical History:  Diagnosis Date  . Allergy   . Anxiety   . Cancer ([place])    breast-left   . Cardiopulmonary arrest ([place]) 1991   Postpartum,? Pulmonary thromboembolism  . Depression  off cymbalta now  . Hyperlipidemia   . Hypertension     Past Surgical History:  Procedure Laterality Date  . BREAST LUMPECTOMY  2003   snbx-Left, Dr.Rubin; Radiation & Chemotherapy X3  . BREAST LUMPECTOMY WITH NEEDLE LOCALIZATION Right 11/30/2012   Procedure: BREAST LUMPECTOMY WITH NEEDLE LOCALIZATION;  Surgeon: Marcello Moores A. Cornett, MD;  Location: [facility];  Service: General;  Laterality: Right;  needle localization at [place] 8:00   . CARDIOVASCULAR STRESS TEST  2005   Neg  . COLONOSCOPY  2006   Normal  . ORIF ANKLE FRACTURE Right 08/22/2019   Procedure: OPEN REDUCTION INTERNAL FIXATION (ORIF) RIGHT ANKLE FRACTURE;  Surgeon: Erle Crocker, MD;  Location: [facility];  Service:  Orthopedics;  Laterality: Right;  . WRIST SURGERY  2012   Left, Dr.Rowen    Social History   Socioeconomic History  . Marital status: Divorced    Spouse name: Not on file  . Number of children: Not on file  . Years of education: Not on file  . Highest education level: Not on file  Occupational History  . Not on file  Tobacco Use  . Smoking status: Former Smoker    Quit date: 08/24/1990    Years since quitting: 29.3  . Smokeless tobacco: Never Used  . Tobacco comment: smoked [place], up to < 1 ppd  Substance and Sexual Activity  . Alcohol use: Yes    Alcohol/week: 14.0 standard drinks    Types: 14 Glasses of wine per week    Comment: Socially  . Drug use: No  . Sexual activity: Not on file  Other Topics Concern  . Not on file  Social History Narrative  . Not on file   Social Determinants of Health   Financial Resource Strain:   . Difficulty of Paying Living Expenses:   Food Insecurity:   . Worried About Charity fundraiser in the Last Year:   . Arboriculturist in the Last Year:   Transportation Needs:   . Film/video editor (Medical):   Marland Kitchen Lack of Transportation (Non-Medical):   Physical Activity:   . Days of Exercise per Week:   . Minutes of Exercise per Session:   Stress:   . Feeling of Stress :   Social Connections:   . Frequency of Communication with Friends and Family:   . Frequency of Social Gatherings with Friends and Family:   . Attends Religious Services:   . Active Member of Clubs or Organizations:   . Attends Archivist Meetings:   Marland Kitchen Marital Status:     Family History  Problem Relation Age of Onset  . Diabetes Mother   . Hypertension Mother   . Hyperlipidemia Mother   . Stroke Mother 76       02/2009  . Heart attack Maternal Grandmother        in her 53s  . Stomach cancer Maternal Grandfather 50  . Colon cancer Neg Hx   . Rectal cancer Neg Hx   . Colon polyps Neg Hx   . Esophageal cancer Neg Hx     Review of Systems    Constitutional: Negative for chills and fever.  Respiratory: Positive for shortness of breath (? related to deconditioning). Negative for cough and wheezing.   Cardiovascular: Positive for leg swelling. Negative for chest pain and palpitations.  Gastrointestinal: Negative for abdominal pain, constipation, diarrhea and nausea.  Neurological: Negative for light-headedness and headaches.  Objective:   Vitals:   12/20/19 1112  BP: (!) 158/70  Pulse: 72  Resp: 16  Temp: 98.7 F (37.1 C)  SpO2: 99%   BP Readings from Last 3 Encounters:  12/20/19 (!) 158/70  08/22/19 135/69  08/17/19 121/82   Wt Readings from Last 3 Encounters:  12/20/19 172 lb 6.4 oz (78.2 kg)  08/22/19 169 lb 15.6 oz (77.1 kg)  08/17/19 170 lb (77.1 kg)   Body mass index is 30.54 kg/m.   Physical Exam    Constitutional: Appears well-developed and well-nourished. No distress.  Head: Normocephalic and atraumatic.  Neck: Neck supple. No tracheal deviation present. No thyromegaly present.  No cervical lymphadenopathy Cardiovascular: Normal rate, regular rhythm and normal heart sounds.  No murmur heard. No carotid bruit .  Mild left lower extremity edema 1+ left ankle edema that is mildly pitting, 1+ right ankle and lower extremity edema that is moderately pitting Pulmonary/Chest: Effort normal and breath sounds normal. No respiratory distress. No has no wheezes. No rales. Abdomen: Soft, nontender, nondistended Skin: Skin is warm and dry. Not diaphoretic.  Psychiatric: Normal mood and affect. Behavior is normal.       Assessment & Plan:    See Problem List for Assessment and Plan of chronic medical problems.    This visit occurred during the SARS-CoV-2 public health emergency.  Safety protocols were in place, including screening questions prior to the visit, additional usage of staff PPE, and extensive cleaning of exam room while observing appropriate contact time as indicated for disinfecting  solutions.

## 2019-12-20 NOTE — Assessment & Plan Note (Signed)
Acute Right lower extremity edema is worse than the left lower extremity, which is likely related to her right ankle fracture from December.  Will rule out DVT-ultrasound ordered No symptoms consistent with heart failure.  No history of kidney or liver issues in the past, though will check CBC, CMP Edema is likely multifactorial--increase salt intake, decreased activity/sitting more, possible venous insufficiency Decrease sodium intake Elevate legs when sitting Continue compression socks on a daily basis Will start hydrochlorothiazide 25 mg daily and reduce amlodipine to 2.5 mg daily Follow-up next month as scheduled

## 2019-12-21 ENCOUNTER — Ambulatory Visit (HOSPITAL_COMMUNITY)
Admission: RE | Admit: 2019-12-21 | Discharge: 2019-12-21 | Disposition: A | Discharge status: Home / Self Care | Payer: Medicare Other | Source: Ambulatory Visit | Attending: Internal Medicine | Admitting: Internal Medicine

## 2019-12-21 DIAGNOSIS — R600 Localized edema: Secondary | ICD-10-CM

## 2019-12-21 LAB — HEMOGLOBIN A1C: Hgb A1c MFr Bld: 6.2 % (ref 4.6–6.5)

## 2019-12-21 LAB — CBC WITH DIFFERENTIAL/PLATELET
Basophils Absolute: 0.1 10*3/uL (ref 0.0–0.1)
Basophils Relative: 1 % (ref 0.0–3.0)
Eosinophils Absolute: 0.3 10*3/uL (ref 0.0–0.7)
Eosinophils Relative: 3.9 % (ref 0.0–5.0)
HCT: 39.9 % (ref 36.0–46.0)
Hemoglobin: 13.4 g/dL (ref 12.0–15.0)
Lymphocytes Relative: 37.4 % (ref 12.0–46.0)
Lymphs Abs: 3.1 10*3/uL (ref 0.7–4.0)
MCHC: 33.5 g/dL (ref 30.0–36.0)
MCV: 92.3 fl (ref 78.0–100.0)
Monocytes Absolute: 0.5 10*3/uL (ref 0.1–1.0)
Monocytes Relative: 6.4 % (ref 3.0–12.0)
Neutro Abs: 4.2 10*3/uL (ref 1.4–7.7)
Neutrophils Relative %: 51.3 % (ref 43.0–77.0)
Platelets: 317 10*3/uL (ref 150.0–400.0)
RBC: 4.32 Mil/uL (ref 3.87–5.11)
RDW: 13.4 % (ref 11.5–15.5)
WBC: 8.2 10*3/uL (ref 4.0–10.5)

## 2019-12-22 ENCOUNTER — Encounter: Payer: Self-pay | Admitting: Internal Medicine

## 2019-12-27 DIAGNOSIS — M25671 Stiffness of right ankle, not elsewhere classified: Secondary | ICD-10-CM | POA: Diagnosis not present

## 2019-12-27 DIAGNOSIS — S82841D Displaced bimalleolar fracture of right lower leg, subsequent encounter for closed fracture with routine healing: Secondary | ICD-10-CM | POA: Diagnosis not present

## 2019-12-29 DIAGNOSIS — M25671 Stiffness of right ankle, not elsewhere classified: Secondary | ICD-10-CM | POA: Diagnosis not present

## 2019-12-29 DIAGNOSIS — S82841D Displaced bimalleolar fracture of right lower leg, subsequent encounter for closed fracture with routine healing: Secondary | ICD-10-CM | POA: Diagnosis not present

## 2020-01-03 ENCOUNTER — Ambulatory Visit
Admission: RE | Admit: 2020-01-03 | Discharge: 2020-01-03 | Disposition: A | Discharge status: Home / Self Care | Payer: Medicare Other | Source: Ambulatory Visit | Attending: Internal Medicine | Admitting: Internal Medicine

## 2020-01-03 ENCOUNTER — Other Ambulatory Visit: Payer: Self-pay

## 2020-01-03 ENCOUNTER — Ambulatory Visit: Payer: Medicare Other

## 2020-01-03 DIAGNOSIS — M25671 Stiffness of right ankle, not elsewhere classified: Secondary | ICD-10-CM | POA: Diagnosis not present

## 2020-01-03 DIAGNOSIS — Z780 Asymptomatic menopausal state: Secondary | ICD-10-CM | POA: Diagnosis not present

## 2020-01-03 DIAGNOSIS — M8589 Other specified disorders of bone density and structure, multiple sites: Secondary | ICD-10-CM | POA: Diagnosis not present

## 2020-01-03 DIAGNOSIS — Z1231 Encounter for screening mammogram for malignant neoplasm of breast: Secondary | ICD-10-CM | POA: Diagnosis not present

## 2020-01-03 DIAGNOSIS — Z13820 Encounter for screening for osteoporosis: Secondary | ICD-10-CM

## 2020-01-03 DIAGNOSIS — S82841D Displaced bimalleolar fracture of right lower leg, subsequent encounter for closed fracture with routine healing: Secondary | ICD-10-CM | POA: Diagnosis not present

## 2020-01-04 ENCOUNTER — Encounter: Payer: Self-pay | Admitting: Internal Medicine

## 2020-01-05 ENCOUNTER — Ambulatory Visit: Payer: Medicare Other | Admitting: Internal Medicine

## 2020-01-10 DIAGNOSIS — M25671 Stiffness of right ankle, not elsewhere classified: Secondary | ICD-10-CM | POA: Diagnosis not present

## 2020-01-10 DIAGNOSIS — S82841D Displaced bimalleolar fracture of right lower leg, subsequent encounter for closed fracture with routine healing: Secondary | ICD-10-CM | POA: Diagnosis not present

## 2020-01-11 NOTE — Progress Notes (Signed)
Subjective:    Patient ID: Desiree Andrews, female    DOB: 1955/12/26, 64 y.o.   MRN: [id number]  HPI The patient is here for follow up of their chronic medical problems, including leg edema, htn   One month we started hctz 25 mg daily for her leg edema and decreased her amlodipine to 2.5 mg daily.  She has been wearing the compression socks and has been taking the hctz daily.  Her edema is controlled and much better.  She has cut down on her salt intake.      Medications and allergies reviewed with patient and updated if appropriate.  Patient Active Problem List   Diagnosis Date Noted  . Bilateral leg edema 12/20/2019  . Ankle swelling, right 04/11/2019  . Alcohol abuse 03/14/2019  . Lichen sclerosus 123XX123  . Prediabetes 02/17/2017  . Obese 02/17/2017  . 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 PARESTHESIA, HANDS 05/20/2009  . Vitamin D deficiency 12/18/2008  . Osteopenia 12/18/2008  . BREAST CANCER, HX OF 12/18/2008  . Dyslipidemia 10/19/2007  . Hepatitis C antibody test positive 10/19/2007  . Essential hypertension 06/07/2007    Current Outpatient Medications on File Prior to Visit  Medication Sig Dispense Refill  . amLODipine (NORVASC) 5 MG tablet TAKE 1 TABLET(5 MG) BY MOUTH DAILY 90 tablet 0  . amphetamine-dextroamphetamine (ADDERALL) 20 MG tablet Take 10 mg by mouth 2 (two) times daily.   0  . atorvastatin (LIPITOR) 10 MG tablet TAKE 1 TABLET(10 MG) BY MOUTH DAILY 90 tablet 1  . betamethasone dipropionate (DIPROLENE) 0.05 % ointment Apply topically 2 (two) times daily. 30 g 0  . cetirizine (ZYRTEC) 10 MG tablet Take 10 mg by mouth as needed for allergies.     . clobetasol cream (TEMOVATE) [id number] % Apply 1 application topically 2 (two) times daily as needed. 30 g 2  . fenofibrate (TRICOR) 145 MG tablet TAKE 1 TABLET(145 MG) BY MOUTH DAILY 90 tablet 1  . hydrochlorothiazide (HYDRODIURIL) 25 MG tablet  Take 1 tablet (25 mg total) by mouth daily. 90 tablet 0  . ibuprofen (ADVIL) 600 MG tablet Take 600 mg by mouth every 8 (eight) hours as needed.    . metoprolol tartrate (LOPRESSOR) 50 MG tablet Take 1.5 tablets (75 mg total) by mouth 2 (two) times daily. 270 tablet 1  . traZODone (DESYREL) 50 MG tablet Take 100 mg by mouth at bedtime as needed for sleep.      No current facility-administered medications on file prior to visit.    Past Medical History:  Diagnosis Date  . Allergy   . Anxiety   . Cancer ([place])    breast-left   . Cardiopulmonary arrest ([place]) 1991   Postpartum,? Pulmonary thromboembolism  . Depression    off cymbalta now  . Hyperlipidemia   . Hypertension     Past Surgical History:  Procedure Laterality Date  . BREAST LUMPECTOMY  2003   snbx-Left, Dr.Rubin; Radiation & Chemotherapy X3  . BREAST LUMPECTOMY WITH NEEDLE LOCALIZATION Right 11/30/2012   Procedure: BREAST LUMPECTOMY WITH NEEDLE LOCALIZATION;  Surgeon: Marcello Moores A. Cornett, MD;  Location: [facility];  Service: General;  Laterality: Right;  needle localization at [place] 8:00   . CARDIOVASCULAR STRESS TEST  2005   Neg  . COLONOSCOPY  2006   Normal  . ORIF ANKLE FRACTURE Right 08/22/2019   Procedure: OPEN REDUCTION INTERNAL FIXATION (ORIF) RIGHT ANKLE FRACTURE;  Surgeon: Erle Crocker, MD;  Location: [facility];  Service: Orthopedics;  Laterality: Right;  . WRIST SURGERY  2012   Left, Dr.Rowen    Social History   Socioeconomic History  . Marital status: Divorced    Spouse name: Not on file  . Number of children: Not on file  . Years of education: Not on file  . Highest education level: Not on file  Occupational History  . Not on file  Tobacco Use  . Smoking status: Former Smoker    Quit date: 08/24/1990    Years since quitting: 29.4  . Smokeless tobacco: Never Used  . Tobacco comment: smoked [place], up to < 1 ppd  Substance and Sexual Activity    . Alcohol use: Yes    Alcohol/week: 14.0 standard drinks    Types: 14 Glasses of wine per week    Comment: Socially  . Drug use: No  . Sexual activity: Not on file  Other Topics Concern  . Not on file  Social History Narrative  . Not on file   Social Determinants of Health   Financial Resource Strain:   . Difficulty of Paying Living Expenses:   Food Insecurity:   . Worried About Charity fundraiser in the Last Year:   . Arboriculturist in the Last Year:   Transportation Needs:   . Film/video editor (Medical):   Marland Kitchen Lack of Transportation (Non-Medical):   Physical Activity:   . Days of Exercise per Week:   . Minutes of Exercise per Session:   Stress:   . Feeling of Stress :   Social Connections:   . Frequency of Communication with Friends and Family:   . Frequency of Social Gatherings with Friends and Family:   . Attends Religious Services:   . Active Member of Clubs or Organizations:   . Attends Archivist Meetings:   Marland Kitchen Marital Status:     Family History  Problem Relation Age of Onset  . Diabetes Mother   . Hypertension Mother   . Hyperlipidemia Mother   . Stroke Mother 72       02/2009  . Heart attack Maternal Grandmother        in her 29s  . Stomach cancer Maternal Grandfather 70  . Colon cancer Neg Hx   . Rectal cancer Neg Hx   . Colon polyps Neg Hx   . Esophageal cancer Neg Hx     Review of Systems  Constitutional: Negative for fever.  Respiratory: Negative for shortness of breath.   Cardiovascular: Positive for leg swelling (controlled). Negative for chest pain and palpitations.  Neurological: Negative for light-headedness and headaches.       Objective:   Vitals:   01/12/20 1410  BP: (!) 144/80  Pulse: 79  Resp: 16  Temp: 98.3 F (36.8 C)  SpO2: 97%   BP Readings from Last 3 Encounters:  01/12/20 (!) 144/80  12/20/19 (!) 158/70  08/22/19 135/69   Wt Readings from Last 3 Encounters:  01/12/20 172 lb (78 kg)  12/20/19 172  lb 6.4 oz (78.2 kg)  08/22/19 169 lb 15.6 oz (77.1 kg)   Body mass index is 30.47 kg/m.   Physical Exam    Constitutional: Appears well-developed and well-nourished. No distress.  HENT:  Head: Normocephalic and atraumatic.  Neck: Neck supple. No tracheal deviation present. No thyromegaly present.  No cervical lymphadenopathy Cardiovascular: Normal rate, regular rhythm and normal heart sounds.  No murmur heard. No carotid bruit .  No edema Pulmonary/Chest: Effort normal and breath sounds normal. No respiratory distress. No has no wheezes. No rales.  Skin: Skin is warm and dry. Not diaphoretic.  Psychiatric: Normal mood and affect. Behavior is normal.      Assessment & Plan:    See Problem List for Assessment and Plan of chronic medical problems.    This visit occurred during the SARS-CoV-2 public health emergency.  Safety protocols were in place, including screening questions prior to the visit, additional usage of staff PPE, and extensive cleaning of exam room while observing appropriate contact time as indicated for disinfecting solutions.

## 2020-01-11 NOTE — Patient Instructions (Addendum)
  Blood work was ordered.   ° ° °Medications reviewed and updated.  Changes include :   none ° ° ° °Please followup in 6 months ° ° °

## 2020-01-12 ENCOUNTER — Ambulatory Visit: Payer: Medicare Other | Admitting: Internal Medicine

## 2020-01-12 ENCOUNTER — Encounter: Payer: Self-pay | Admitting: Internal Medicine

## 2020-01-12 ENCOUNTER — Other Ambulatory Visit: Payer: Self-pay

## 2020-01-12 VITALS — BP 144/80 | HR 79 | Temp 98.3°F | Resp 16 | Ht 63.0 in | Wt 172.0 lb

## 2020-01-12 DIAGNOSIS — I10 Essential (primary) hypertension: Secondary | ICD-10-CM | POA: Diagnosis not present

## 2020-01-12 DIAGNOSIS — R600 Localized edema: Secondary | ICD-10-CM

## 2020-01-12 LAB — BASIC METABOLIC PANEL
BUN: 20 mg/dL (ref 6–23)
CO2: 31 mEq/L (ref 19–32)
Calcium: 9.9 mg/dL (ref 8.4–10.5)
Chloride: 98 mEq/L (ref 96–112)
Creatinine, Ser: 1.04 mg/dL (ref 0.40–1.20)
GFR: 53.06 mL/min — ABNORMAL LOW (ref >60.00)
Glucose, Bld: 131 mg/dL — ABNORMAL HIGH (ref 70–99)
Potassium: 3.9 mEq/L (ref 3.5–5.1)
Sodium: 137 mEq/L (ref 135–145)

## 2020-01-12 NOTE — Assessment & Plan Note (Addendum)
Chronic BP slightly elevated here but seems to be controlled Continue current medications at current doses Start monitoring BP at home - discussed goal Low sodium diet Advised regular exercise, weight loss F/u in 6 months  BMP today

## 2020-01-12 NOTE — Assessment & Plan Note (Signed)
Improved with hctz 25 mg daily Wearing compression socks, has decreased sodium in diet Continue above Bmp today Advised weight loss

## 2020-01-14 ENCOUNTER — Encounter: Payer: Self-pay | Admitting: Internal Medicine

## 2020-02-22 ENCOUNTER — Ambulatory Visit: Payer: Medicare Other | Admitting: Pharmacist

## 2020-02-22 ENCOUNTER — Other Ambulatory Visit: Payer: Self-pay

## 2020-02-22 DIAGNOSIS — G479 Sleep disorder, unspecified: Secondary | ICD-10-CM

## 2020-02-22 DIAGNOSIS — I10 Essential (primary) hypertension: Secondary | ICD-10-CM

## 2020-02-22 DIAGNOSIS — E785 Hyperlipidemia, unspecified: Secondary | ICD-10-CM

## 2020-02-22 NOTE — Chronic Care Management (AMB) (Signed)
Chronic Care Management Pharmacy  Name: Desiree Andrews  MRN: [id number] DOB: [birth year]   Chief Complaint/ HPI  Desiree Andrews,  64 y.o. , female presents for their Follow-Up CCM visit with the clinical pharmacist via telephone due to COVID-19 Pandemic.  PCP : [name], MD   Their chronic conditions include: HTN, HLD, prediabetes, osteopenia, hx breast cancer  Office Visits: 01/12/20 Dr Quay Burow OV: LE edema improved w/ HCTZ.  01/04/20 BMD showed osteopenia, rec'd calcium and vitamin D optimization.  12/20/19 Dr Quay Burow OV: bilateral LE edema, [place] ordered to r/o DVT. Advised to decrease salt intake, elevate legs. Start HCTZ 25 mg and reduced amlodipine to 2.5 mg daily. Also will start checking BP at home.  07/24/19 Dr Quay Burow OV: acute visit for R foot swelling, injury in August, no evidence of fracture. Rec'd symptomatic treatment w/ compression and elevation.  04/11/19 Dr Quay Burow OV: BP, HLD, prediabetes stable. Advised reduce alcohol intake  03/14/19 Dr Quay Burow OV: BP uncontrolled 162/96, increased amlodipine to 5 mg daily and metoprolol to 75 mg BID. May increase trazodone to 100 mg HS. Encouraged daily fenofibrate compliance. Reduce alcohol intake.   Consult Visit: 08/17/19 ED visit- R ankle fracture d/t fall, dc'd with crutches and pain meds, f/u with ortho.  Medications: Outpatient Encounter Medications as of 02/22/2020  Medication Sig  . amLODipine (NORVASC) 5 MG tablet TAKE 1 TABLET(5 MG) BY MOUTH DAILY  . amphetamine-dextroamphetamine (ADDERALL) 20 MG tablet Take 10 mg by mouth 2 (two) times daily.   Marland Kitchen atorvastatin (LIPITOR) 10 MG tablet TAKE 1 TABLET(10 MG) BY MOUTH DAILY  . betamethasone dipropionate (DIPROLENE) 0.05 % ointment Apply topically 2 (two) times daily.  . cetirizine (ZYRTEC) 10 MG tablet Take 10 mg by mouth as needed for allergies.   . clobetasol cream (TEMOVATE) 3.76 % Apply 1 application topically 2 (two) times daily as needed.  . fenofibrate (TRICOR)  145 MG tablet TAKE 1 TABLET(145 MG) BY MOUTH DAILY  . hydrochlorothiazide (HYDRODIURIL) 25 MG tablet Take 1 tablet (25 mg total) by mouth daily.  Marland Kitchen ibuprofen (ADVIL) 600 MG tablet Take 600 mg by mouth every 8 (eight) hours as needed.  . metoprolol tartrate (LOPRESSOR) 50 MG tablet Take 1.5 tablets (75 mg total) by mouth 2 (two) times daily.  . traZODone (DESYREL) 50 MG tablet Take 100 mg by mouth at bedtime as needed for sleep.    No facility-administered encounter medications on file as of 02/22/2020.     Current Diagnosis/Assessment:  SDOH Interventions     Most Recent Value  SDOH Interventions  Financial Strain Interventions Intervention Not Indicated      Goals Addressed            This Visit's Progress   . Pharmacy Care Plan       CARE PLAN ENTRY (see longitudinal plan of care for additional care plan information)  Current Barriers:  . Chronic Disease Management support, education, and care coordination needs related to Hypertension, Hyperlipidemia, and Insomnia   Hypertension BP Readings from Last 3 Encounters:  01/12/20 (!) 144/80  12/20/19 (!) 158/70  08/22/19 135/69 .  Pharmacist Clinical Goal(s): o Over the next 90 days, patient will work with PharmD and providers to achieve BP goal <130/80 . Current regimen:  o amlodipine 2.5 mg daily o metoprolol tartrate 50 mg -1.5 tablets twice a day o HCTZ 25 mg daily . Interventions: o Discussed BP goals and benefits of medication for prevention of heart attack / stroke . Patient  self care activities - Over the next 90 days, patient will: o Check BP daily, document, and provide at future appointments o Ensure daily salt intake < 2300 mg/day  Hyperlipidemia Lab Results  Component Value Date/Time   LDLCALC 184 (H) 10/26/2008 10:32 AM   LDLDIRECT 120.0 03/02/2019 08:24 AM .  Pharmacist Clinical Goal(s): o Over the next 90 days, patient will work with PharmD and providers to achieve LDL goal < 100 . Current regimen:    o atorvastatin 10 mg daily,  o fenofibrate 145 mg daily,  . Interventions: o Discussed cholesterol goals and benefits of medication for prevention of heart attack / stroke . Patient self care activities - Over the next 90 days, patient will: o Continue medications as prescribed  Insomnia . Pharmacist Clinical Goal(s) o Over the next 90 days, patient will work with PharmD and providers to optimize therapy . Current regimen:  o Trazodone 50 mg as needed at bedtime . Interventions: o Discussed pharmaceutical options for sleep - recommend against habit-forming agents like Ambien, Xanax, etc. o Discussed benefits of melatonin to aid with sleep cycle . Patient self care activities - Over the next 90 days, patient will: o Take melatonin 5-10 mg at bedtime around the same time nightly  Medication management . Pharmacist Clinical Goal(s): o Over the next 90 days, patient will work with PharmD and providers to maintain optimal medication adherence . Current pharmacy: [facility] . Interventions o Comprehensive medication review performed. o Continue current medication management strategy . Patient self care activities - Over the next 90 days, patient will: o Focus on medication adherence by patient report o Take medications as prescribed o Report any questions or concerns to PharmD and/or provider(s)  Please see past updates related to this goal by clicking on the "Past Updates" button in the selected goal       Hypertension   BP goal < 130/80  Office blood pressures are  BP Readings from Last 3 Encounters:  01/12/20 (!) 144/80  12/20/19 (!) 158/70  08/22/19 135/69   Kidney Function Lab Results  Component Value Date/Time   CREATININE 1.04 01/12/2020 02:37 PM   CREATININE 0.98 12/20/2019 12:01 PM   GFR 53.06 (L) 01/12/2020 02:37 PM   GFRNONAA >60 08/17/2019 10:27 AM   GFRAA >60 08/17/2019 10:27 AM   K 3.9 01/12/2020 02:37 PM   K 4.1 12/20/2019 12:01 PM   Patient has failed  these meds in the past: n/a Patient is currently controlled on the following medications:   amlodipine 2.5 mg daily,   metoprolol tartrate 75 mg BID  HCTZ 25 mg daily  Patient checks BP at home 3-5x per week   Patient home BP readings are ranging: ~SBP 140  We discussed: pt is doing well since recent med changes (add HCTZ and reduce amlodipine d/t swelling); she reports vast improvement in swelling and SBP ranging ~140. Discussed BP goal and effect of salt.  Plan   Continue current medications and control with diet and exercise  Limit salt   Hyperlipidemia   LDL goal < 100  Lipid Panel     Component Value Date/Time   CHOL 253 (H) 03/02/2019 0824   TRIG (H) 03/02/2019 0824    503.0 Triglyceride is over 400; calculations on Lipids are invalid.   HDL 44.30 03/02/2019 0824   CHOLHDL 6 03/02/2019 0824   VLDL 72.4 (H) 08/30/2017 0917   LDLCALC 184 (H) 10/26/2008 1032   LDLDIRECT 120.0 03/02/2019 0824   The 10-year ASCVD risk  score Mikey Bussing DC Jr., et al., 2013) is: 11.5%   Values used to calculate the score:     Age: 4 years     Sex: Female     Is Non-Hispanic African American: No     Diabetic: No     Tobacco smoker: No     Systolic Blood Pressure: 110 mmHg     Is BP treated: Yes     HDL Cholesterol: 44.3 mg/dL     Total Cholesterol: 253 mg/dL   Patient has failed these meds in past: pravastatin Patient is currently uncontrolled on the following medications:   atorvastatin 10 mg daily,   fenofibrate 145 mg daily,   Hx noncompliance with fenofibrate, supposed to be taking daily now.   We discussed:  diet and exercise extensively, pt is still struggling to exercise due to healing foot; she had tried seated exercise but has not been consistent; she is trying to walk more to expedite healing.  Plan  Continue current medications and control with diet and exercise   Depression / Insomnia   Depression screen [facility] 2/9 08/26/2018  Decreased Interest 0  Down, Depressed,  Hopeless 0  PHQ - 2 Score 0   Patient has failed these meds in past: citalopram, duloxetine, hydroxyzine, lorazepam Patient is currently uncontrolled on the following medications:  . Trazodone 50 mg HS prn  We discussed: Pt reports vivid dreams with trazodone, not necessarily bad dreams and not necessarily interfering with sleep, but she does not wake feeling rested. Discussed possibility of stopping trazodone, pt does not want to stop right now. Discussed pharmaceutical options for sleep - recommend against habit forming sleep aids (zolpidem, eszoplicone,etc) and benzodiazepines; discussed mirtazapine, however pt declines due to possibility for wt gain. Discussed benefits of melatonin.  Plan  Continue current medications  Try melatonin 5-10 mg HS  Pain/Foot fracture   Patient has failed these meds in past: oxycodone, hydrocodone Patient is currently controlled on the following medications:   ibuprofen 600 mg q8h prn  We discussed: pt is still healing slowly after foot fracture in December; trying to walk and move around more to speed healing  Plan  Continue current medications   ADD (no official dx on file)   Patient has failed these meds in past: n/a Patient is currently controlled on the following medications:   Amphetamine-dextroamphetamine 10 mg (1/2 of 20 mg) daily  We discussed:  Pt reports she is taking this to help her focus. Discussed side effects of stimulants including elevated BP and HR, insomnia. Will continue to monitor for side effects.  Plan  Continue current medications  Medication Management   Pt uses [facility] for all medications Does not use pill box Pt endorses 100% compliance   We discussed:  [facility] is preferred pharmacy with patient's insurance; denies issues with services  Plan  Continue current med management strategy    Follow up: 3 month phone visit  Charlene Brooke, PharmD Clinical Pharmacist [facility] at  [facility] [phone]

## 2020-02-23 NOTE — Patient Instructions (Addendum)
Visit Information  Phone number for Pharmacist: [phone]  Goals Addressed            This Visit's Progress   . Pharmacy Care Plan       CARE PLAN ENTRY (see longitudinal plan of care for additional care plan information)  Current Barriers:  . Chronic Disease Management support, education, and care coordination needs related to Hypertension, Hyperlipidemia, and Insomnia   Hypertension BP Readings from Last 3 Encounters:  01/12/20 (!) 144/80  12/20/19 (!) 158/70  08/22/19 135/69 .  Pharmacist Clinical Goal(s): o Over the next 90 days, patient will work with PharmD and providers to achieve BP goal <130/80 . Current regimen:  o amlodipine 2.5 mg daily o metoprolol tartrate 50 mg -1.5 tablets twice a day o HCTZ 25 mg daily . Interventions: o Discussed BP goals and benefits of medication for prevention of heart attack / stroke . Patient self care activities - Over the next 90 days, patient will: o Check BP daily, document, and provide at future appointments o Ensure daily salt intake < 2300 mg/day  Hyperlipidemia Lab Results  Component Value Date/Time   LDLCALC 184 (H) 10/26/2008 10:32 AM   LDLDIRECT 120.0 03/02/2019 08:24 AM .  Pharmacist Clinical Goal(s): o Over the next 90 days, patient will work with PharmD and providers to achieve LDL goal < 100 . Current regimen:  o atorvastatin 10 mg daily,  o fenofibrate 145 mg daily,  . Interventions: o Discussed cholesterol goals and benefits of medication for prevention of heart attack / stroke . Patient self care activities - Over the next 90 days, patient will: o Continue medications as prescribed  Insomnia . Pharmacist Clinical Goal(s) o Over the next 90 days, patient will work with PharmD and providers to optimize therapy . Current regimen:  o Trazodone 50 mg as needed at bedtime . Interventions: o Discussed pharmaceutical options for sleep - recommend against habit-forming agents like Ambien, Xanax,  etc. o Discussed benefits of melatonin to aid with sleep cycle . Patient self care activities - Over the next 90 days, patient will: o Take melatonin 5-10 mg at bedtime around the same time nightly  Medication management . Pharmacist Clinical Goal(s): o Over the next 90 days, patient will work with PharmD and providers to maintain optimal medication adherence . Current pharmacy: [facility] . Interventions o Comprehensive medication review performed. o Continue current medication management strategy . Patient self care activities - Over the next 90 days, patient will: o Focus on medication adherence by patient report o Take medications as prescribed o Report any questions or concerns to PharmD and/or provider(s)  Please see past updates related to this goal by clicking on the "Past Updates" button in the selected goal       The patient verbalized understanding of instructions provided today and agreed to receive a mailed copy of patient instruction and/or educational materials.  Telephone follow up appointment with pharmacy team member scheduled for: 3 months  Charlene Brooke, PharmD Clinical Pharmacist [facility] at [facility] [phone]  Quality Sleep Information, Adult Quality sleep is important for your mental and physical health. It also improves your quality of life. Quality sleep means you:  Are asleep for most of the time you are in bed.  Fall asleep within 30 minutes.  Wake up no more than once a night.  Are awake for no longer than 20 minutes if you do wake up during the night. Most adults need 7-8 hours of quality sleep each  night. How can poor sleep affect me? If you do not get enough quality sleep, you may have:  Mood swings.  Daytime sleepiness.  Confusion.  Decreased reaction time.  Sleep disorders, such as insomnia and sleep apnea.  Difficulty with: ? Solving problems. ? Coping with stress. ? Paying attention. These issues may  affect your performance and productivity at work, school, and at home. Lack of sleep may also put you at higher risk for accidents, suicide, and risky behaviors. If you do not get quality sleep you may also be at higher risk for several health problems, including:  Infections.  Type 2 diabetes.  Heart disease.  High blood pressure.  Obesity.  Worsening of long-term conditions, like arthritis, kidney disease, depression, Parkinson's disease, and epilepsy. What actions can I take to get more quality sleep?      Stick to a sleep schedule. Go to sleep and wake up at about the same time each day. Do not try to sleep less on weekdays and make up for lost sleep on weekends. This does not work.  Try to get about 30 minutes of exercise on most days. Do not exercise 2-3 hours before going to bed.  Limit naps during the day to 30 minutes or less.  Do not use any products that contain nicotine or tobacco, such as cigarettes or e-cigarettes. If you need help quitting, ask your health care provider.  Do not drink caffeinated beverages for at least 8 hours before going to bed. Coffee, tea, and some sodas contain caffeine.  Do not drink alcohol close to bedtime.  Do not eat large meals close to bedtime.  Do not take naps in the late afternoon.  Try to get at least 30 minutes of sunlight every day. Morning sunlight is best.  Make time to relax before bed. Reading, listening to music, or taking a hot bath promotes quality sleep.  Make your bedroom a place that promotes quality sleep. Keep your bedroom dark, quiet, and at a comfortable room temperature. Make sure your bed is comfortable. Take out sleep distractions like TV, a computer, smartphone, and bright lights.  If you are lying awake in bed for longer than 20 minutes, get up and do a relaxing activity until you feel sleepy.  Work with your health care provider to treat medical conditions that may affect sleeping, such as: ? Nasal  obstruction. ? Snoring. ? Sleep apnea and other sleep disorders.  Talk to your health care provider if you think any of your prescription medicines may cause you to have difficulty falling or staying asleep.  If you have sleep problems, talk with a sleep consultant. If you think you have a sleep disorder, talk with your health care provider about getting evaluated by a specialist. Where to find more information  [place] website: https://sleepfoundation.org  National Heart, Lung, and [place] ([place]): http://www.saunders.info/.pdf  Centers for Disease Control and Prevention (CDC): LearningDermatology.pl Contact a health care provider if you:  Have trouble getting to sleep or staying asleep.  Often wake up very early in the morning and cannot get back to sleep.  Have daytime sleepiness.  Have daytime sleep attacks of suddenly falling asleep and sudden muscle weakness (narcolepsy).  Have a tingling sensation in your legs with a strong urge to move your legs (restless legs syndrome).  Stop breathing briefly during sleep (sleep apnea).  Think you have a sleep disorder or are taking a medicine that is affecting your quality of sleep. Summary  Most adults need 7-8 hours of quality sleep each night.  Getting enough quality sleep is an important part of health and well-being.  Make your bedroom a place that promotes quality sleep and avoid things that may cause you to have poor sleep, such as alcohol, caffeine, smoking, and large meals.  Talk to your health care provider if you have trouble falling asleep or staying asleep. This information is not intended to replace advice given to you by your health care provider. Make sure you discuss any questions you have with your health care provider. Document Revised: 11/17/2017 Document Reviewed: 11/17/2017 Elsevier Patient Education  [place].

## 2020-03-01 DIAGNOSIS — M25671 Stiffness of right ankle, not elsewhere classified: Secondary | ICD-10-CM | POA: Diagnosis not present

## 2020-03-13 ENCOUNTER — Other Ambulatory Visit: Payer: Self-pay | Admitting: Internal Medicine

## 2020-03-29 ENCOUNTER — Other Ambulatory Visit: Payer: Self-pay | Admitting: Internal Medicine

## 2020-04-18 ENCOUNTER — Other Ambulatory Visit: Payer: Self-pay | Admitting: Internal Medicine

## 2020-04-22 DIAGNOSIS — M25571 Pain in right ankle and joints of right foot: Secondary | ICD-10-CM | POA: Diagnosis not present

## 2020-04-30 DIAGNOSIS — M25571 Pain in right ankle and joints of right foot: Secondary | ICD-10-CM | POA: Diagnosis not present

## 2020-05-01 ENCOUNTER — Other Ambulatory Visit: Payer: Self-pay | Admitting: Internal Medicine

## 2020-05-02 DIAGNOSIS — M25571 Pain in right ankle and joints of right foot: Secondary | ICD-10-CM | POA: Diagnosis not present

## 2020-05-07 DIAGNOSIS — M25571 Pain in right ankle and joints of right foot: Secondary | ICD-10-CM | POA: Diagnosis not present

## 2020-05-08 DIAGNOSIS — M25571 Pain in right ankle and joints of right foot: Secondary | ICD-10-CM | POA: Diagnosis not present

## 2020-05-16 DIAGNOSIS — M25571 Pain in right ankle and joints of right foot: Secondary | ICD-10-CM | POA: Diagnosis not present

## 2020-05-21 ENCOUNTER — Encounter: Payer: Self-pay | Admitting: Physician Assistant

## 2020-05-21 ENCOUNTER — Ambulatory Visit: Payer: Medicare Other | Admitting: Physician Assistant

## 2020-05-21 ENCOUNTER — Other Ambulatory Visit: Payer: Self-pay

## 2020-05-21 DIAGNOSIS — Z1283 Encounter for screening for malignant neoplasm of skin: Secondary | ICD-10-CM | POA: Diagnosis not present

## 2020-05-21 DIAGNOSIS — L820 Inflamed seborrheic keratosis: Secondary | ICD-10-CM | POA: Diagnosis not present

## 2020-05-21 DIAGNOSIS — L988 Other specified disorders of the skin and subcutaneous tissue: Secondary | ICD-10-CM

## 2020-05-21 MED ORDER — TRETINOIN 0.1 % EX CREA: TOPICAL_CREAM | Freq: Every evening | CUTANEOUS | 6 refills | Status: AC

## 2020-05-22 ENCOUNTER — Ambulatory Visit: Payer: Medicare Other | Admitting: Pharmacist

## 2020-05-22 DIAGNOSIS — I10 Essential (primary) hypertension: Secondary | ICD-10-CM

## 2020-05-22 DIAGNOSIS — G479 Sleep disorder, unspecified: Secondary | ICD-10-CM

## 2020-05-22 DIAGNOSIS — E785 Hyperlipidemia, unspecified: Secondary | ICD-10-CM

## 2020-05-22 NOTE — Progress Notes (Signed)
° °  Follow-Up Visit   Subjective  Desiree Andrews is a 64 y.o. female who presents for the following: Annual Exam (no concerns).   The following portions of the chart were reviewed this encounter and updated as appropriate: Tobacco   Allergies   Meds   Problems   Med Hx   Surg Hx   Fam Hx       Objective  Well appearing patient in no apparent distress; mood and affect are within normal limits.  A full examination was performed including scalp, head, eyes, ears, nose, lips, neck, chest, axillae, abdomen, back, buttocks, bilateral upper extremities, bilateral lower extremities, hands, feet, fingers, toes, fingernails, and toenails. All findings within normal limits unless otherwise noted below.  Objective  head to toe: No atypical nevi No signs of non-mole skin cancer.   Objective  Head - Anterior (Face): Perioccular and forehead  Objective  chest (5): Erythematous stuck-on, waxy papule or plaque.    Assessment & Plan  Screening exam for skin cancer head to toe  Yearly skin exams  [place] Head - Anterior (Face)  tretinoin (RETIN-A) 0.1 % cream - Head - Anterior (Face)  Inflamed seborrheic keratosis (5) chest  Destruction of lesion - chest Complexity: simple   Destruction method: cryotherapy   Informed consent: discussed and consent obtained   Timeout:  patient name, date of birth, surgical site, and procedure verified Lesion destroyed using liquid nitrogen: Yes   Cryotherapy cycles:  1 Outcome: patient tolerated procedure well with no complications   Post-procedure details: wound care instructions given     I, Marisol Giambra, PA-C, have reviewed all documentation's for this visit.  The documentation on 05/22/20 for the exam, diagnosis, procedures and orders are all accurate and complete.

## 2020-05-22 NOTE — Patient Instructions (Addendum)
Visit Information  Phone number for Pharmacist: [phone]  Goals Addressed            This Visit's Progress   . Pharmacy Care Plan       CARE PLAN ENTRY (see longitudinal plan of care for additional care plan information)  Current Barriers:  . Chronic Disease Management support, education, and care coordination needs related to Hypertension, Hyperlipidemia, and Insomnia   Hypertension BP Readings from Last 3 Encounters:  01/12/20 (!) 144/80  12/20/19 (!) 158/70  08/22/19 135/69 .  Pharmacist Clinical Goal(s): o Over the next 180 days, patient will work with PharmD and providers to achieve BP goal <130/80 . Current regimen:  o amlodipine 2.5 mg daily o metoprolol tartrate 50 mg -1.5 tablets twice a day o HCTZ 25 mg daily . Interventions: o Discussed BP goals and benefits of medication for prevention of heart attack / stroke . Patient self care activities - Over the next 180 days, patient will: o Check BP daily, document, and provide at future appointments o Ensure daily salt intake < 2300 mg/day  Hyperlipidemia Lab Results  Component Value Date/Time   LDLCALC 184 (H) 10/26/2008 10:32 AM   LDLDIRECT 120.0 03/02/2019 08:24 AM .  Pharmacist Clinical Goal(s): o Over the next 180 days, patient will work with PharmD and providers to achieve LDL goal < 100 . Current regimen:  o atorvastatin 10 mg daily,  o fenofibrate 145 mg daily,  . Interventions: o Discussed cholesterol goals and benefits of medication for prevention of heart attack / stroke . Patient self care activities - Over the next 180 days, patient will: o Continue medications as prescribed  Insomnia . Pharmacist Clinical Goal(s) o Over the next 180 days, patient will work with PharmD and providers to optimize therapy . Current regimen:  o Trazodone 50 mg as needed at bedtime . Interventions: o Discussed pharmaceutical options for sleep - recommend against habit-forming agents like Ambien, Xanax,  etc. o Discussed sleep hygiene . Patient self care activities - Over the next 180 days, patient will: o   Medication management . Pharmacist Clinical Goal(s): o Over the next 180 days, patient will work with PharmD and providers to maintain optimal medication adherence . Current pharmacy: [facility] . Interventions o Comprehensive medication review performed. o Continue current medication management strategy . Patient self care activities - Over the next 180 days, patient will: o Focus on medication adherence by patient report o Take medications as prescribed o Report any questions or concerns to PharmD and/or provider(s)  Please see past updates related to this goal by clicking on the "Past Updates" button in the selected goal       Patient verbalizes understanding of instructions provided today.  Telephone follow up appointment with pharmacy team member scheduled for: 6 months  Charlene Brooke, PharmD, BCACP Clinical Pharmacist [facility] [phone]  Quality Sleep Information, Adult Quality sleep is important for your mental and physical health. It also improves your quality of life. Quality sleep means you:  Are asleep for most of the time you are in bed.  Fall asleep within 30 minutes.  Wake up no more than once a night.  Are awake for no longer than 20 minutes if you do wake up during the night. Most adults need 7-8 hours of quality sleep each night. How can poor sleep affect me? If you do not get enough quality sleep, you may have:  Mood swings.  Daytime sleepiness.  Confusion.  Decreased reaction  time.  Sleep disorders, such as insomnia and sleep apnea.  Difficulty with: ? Solving problems. ? Coping with stress. ? Paying attention. These issues may affect your performance and productivity at work, school, and at home. Lack of sleep may also put you at higher risk for accidents, suicide, and risky behaviors. If you do not  get quality sleep you may also be at higher risk for several health problems, including:  Infections.  Type 2 diabetes.  Heart disease.  High blood pressure.  Obesity.  Worsening of long-term conditions, like arthritis, kidney disease, depression, Parkinson's disease, and epilepsy. What actions can I take to get more quality sleep?      Stick to a sleep schedule. Go to sleep and wake up at about the same time each day. Do not try to sleep less on weekdays and make up for lost sleep on weekends. This does not work.  Try to get about 30 minutes of exercise on most days. Do not exercise 2-3 hours before going to bed.  Limit naps during the day to 30 minutes or less.  Do not use any products that contain nicotine or tobacco, such as cigarettes or e-cigarettes. If you need help quitting, ask your health care provider.  Do not drink caffeinated beverages for at least 8 hours before going to bed. Coffee, tea, and some sodas contain caffeine.  Do not drink alcohol close to bedtime.  Do not eat large meals close to bedtime.  Do not take naps in the late afternoon.  Try to get at least 30 minutes of sunlight every day. Morning sunlight is best.  Make time to relax before bed. Reading, listening to music, or taking a hot bath promotes quality sleep.  Make your bedroom a place that promotes quality sleep. Keep your bedroom dark, quiet, and at a comfortable room temperature. Make sure your bed is comfortable. Take out sleep distractions like TV, a computer, smartphone, and bright lights.  If you are lying awake in bed for longer than 20 minutes, get up and do a relaxing activity until you feel sleepy.  Work with your health care provider to treat medical conditions that may affect sleeping, such as: ? Nasal obstruction. ? Snoring. ? Sleep apnea and other sleep disorders.  Talk to your health care provider if you think any of your prescription medicines may cause you to have  difficulty falling or staying asleep.  If you have sleep problems, talk with a sleep consultant. If you think you have a sleep disorder, talk with your health care provider about getting evaluated by a specialist. Where to find more information  [place] website: https://sleepfoundation.org  National Heart, Lung, and [place]): http://www.saunders.info/.pdf  Centers for Disease Control and Prevention (CDC): LearningDermatology.pl Contact a health care provider if you:  Have trouble getting to sleep or staying asleep.  Often wake up very early in the morning and cannot get back to sleep.  Have daytime sleepiness.  Have daytime sleep attacks of suddenly falling asleep and sudden muscle weakness (narcolepsy).  Have a tingling sensation in your legs with a strong urge to move your legs (restless legs syndrome).  Stop breathing briefly during sleep (sleep apnea).  Think you have a sleep disorder or are taking a medicine that is affecting your quality of sleep. Summary  Most adults need 7-8 hours of quality sleep each night.  Getting enough quality sleep is an important part of health and well-being.  Make your bedroom a place  that promotes quality sleep and avoid things that may cause you to have poor sleep, such as alcohol, caffeine, smoking, and large meals.  Talk to your health care provider if you have trouble falling asleep or staying asleep. This information is not intended to replace advice given to you by your health care provider. Make sure you discuss any questions you have with your health care provider. Document Revised: 11/17/2017 Document Reviewed: 11/17/2017 Elsevier Patient Education  [place].

## 2020-05-22 NOTE — Chronic Care Management (AMB) (Signed)
Chronic Care Management Pharmacy  Name: Akosua Constantine  MRN: [id number] DOB: 03-16-1956   Chief Complaint/ HPI  Desiree Andrews,  64 y.o. , female presents for their Follow-Up CCM visit with the clinical pharmacist via telephone due to COVID-19 Pandemic.  PCP : [name], MD  Patient Care Team: [name], MD as PCP - General (Internal Medicine) Charlton Haws, [facility] as Pharmacist (Pharmacist) [name], PA-C as Physician Assistant (Dermatology)   Their chronic conditions include: HTN, HLD, prediabetes, osteopenia, hx breast cancer  Office Visits: 01/12/20 Dr Quay Burow OV: LE edema improved w/ HCTZ.  01/04/20 BMD showed osteopenia, rec'd calcium and vitamin D optimization.  12/20/19 Dr Quay Burow OV: bilateral LE edema, [place] ordered to r/o DVT. Advised to decrease salt intake, elevate legs. Start HCTZ 25 mg and reduced amlodipine to 2.5 mg daily. Also will start checking BP at home.  Consult Visit: 08/17/19 ED visit- R ankle fracture d/t fall, dc'd with crutches and pain meds, f/u with ortho.  Medications: Outpatient Encounter Medications as of 05/22/2020  Medication Sig  . amLODipine (NORVASC) 5 MG tablet TAKE 1 TABLET(5 MG) BY MOUTH DAILY  . amphetamine-dextroamphetamine (ADDERALL) 20 MG tablet Take 10 mg by mouth 2 (two) times daily.   Marland Kitchen atorvastatin (LIPITOR) 10 MG tablet TAKE 1 TABLET(10 MG) BY MOUTH DAILY  . betamethasone dipropionate (DIPROLENE) 0.05 % ointment Apply topically 2 (two) times daily.  . cetirizine (ZYRTEC) 10 MG tablet Take 10 mg by mouth as needed for allergies.   . clobetasol cream (TEMOVATE) 9.38 % Apply 1 application topically 2 (two) times daily as needed.  . fenofibrate (TRICOR) 145 MG tablet TAKE 1 TABLET(145 MG) BY MOUTH DAILY  . hydrochlorothiazide (HYDRODIURIL) 25 MG tablet TAKE 1 TABLET(25 MG) BY MOUTH DAILY  . ibuprofen (ADVIL) 600 MG tablet Take 600 mg by mouth every 8 (eight) hours as needed.  . metoprolol tartrate  (LOPRESSOR) 50 MG tablet TAKE 1 AND 1/2 TABLETS(75 MG) BY MOUTH TWICE DAILY  . traZODone (DESYREL) 50 MG tablet Take 100 mg by mouth at bedtime as needed for sleep.   Marland Kitchen tretinoin (RETIN-A) 0.1 % cream Apply topically at bedtime.   No facility-administered encounter medications on file as of 05/22/2020.     Current Diagnosis/Assessment:    Goals Addressed   None    Hypertension   BP goal < 130/80  Office blood pressures are  BP Readings from Last 3 Encounters:  01/12/20 (!) 144/80  12/20/19 (!) 158/70  08/22/19 135/69   Kidney Function Lab Results  Component Value Date/Time   CREATININE 1.04 01/12/2020 02:37 PM   CREATININE 0.98 12/20/2019 12:01 PM   GFR 53.06 (L) 01/12/2020 02:37 PM   GFRNONAA >60 08/17/2019 10:27 AM   GFRAA >60 08/17/2019 10:27 AM   K 3.9 01/12/2020 02:37 PM   K 4.1 12/20/2019 12:01 PM   Patient has failed these meds in the past: n/a Patient is currently controlled on the following medications:   amlodipine 2.5 mg daily,   metoprolol tartrate 75 mg BID  HCTZ 25 mg daily  Patient checks BP at home 3-5x per week   Patient home BP readings are ranging: ~SBP 140  We discussed: BP goals; importance of reducing salt in diet;   Plan   Continue current medications and control with diet and exercise  Limit salt   Hyperlipidemia   LDL goal < 100  Lipid Panel     Component Value Date/Time   CHOL 253 (H)  03/02/2019 0824   TRIG (H) 03/02/2019 0824    503.0 Triglyceride is over 400; calculations on Lipids are invalid.   HDL 44.30 03/02/2019 0824   CHOLHDL 6 03/02/2019 0824   VLDL 72.4 (H) 08/30/2017 0917   LDLCALC 184 (H) 10/26/2008 1032   LDLDIRECT 120.0 03/02/2019 0824   The 10-year ASCVD risk score Mikey Bussing DC Jr., et al., 2013) is: 12.5%   Values used to calculate the score:     Age: 68 years     Sex: Female     Is Non-Hispanic African American: No     Diabetic: No     Tobacco smoker: No     Systolic Blood Pressure: 063 mmHg     Is  BP treated: Yes     HDL Cholesterol: 44.3 mg/dL     Total Cholesterol: 253 mg/dL   Patient has failed these meds in past: pravastatin Patient is currently uncontrolled on the following medications:   atorvastatin 10 mg daily,   fenofibrate 145 mg daily,   Hx noncompliance with fenofibrate, supposed to be taking daily now.   We discussed:  diet and exercise extensively; Cholesterol goals; benefits of statin for ASCVD risk reduction  Plan  Continue current medications and control with diet and exercise   Depression / Insomnia   Depression screen [facility] 2/9 08/26/2018  Decreased Interest 0  Down, Depressed, Hopeless 0  PHQ - 2 Score 0   Patient has failed these meds in past: citalopram, duloxetine, hydroxyzine, lorazepam Patient is currently uncontrolled on the following medications:  . Trazodone 50 mg HS prn  We discussed: Pt tried melatonin but reports it did not help; she still struggles with sleep; discussed sleep hygiene  Plan  Continue current medications  Sleep hygiene  ADD (no official dx on file)   Patient has failed these meds in past: n/a Patient is currently controlled on the following medications:   Amphetamine-dextroamphetamine 10 mg (1/2 of 20 mg) daily  We discussed:  Pt reports she is taking this to help her focus. Discussed side effects of stimulants including elevated BP and HR, insomnia. Will continue to monitor for side effects.  Plan  Continue current medications  Medication Management   Pt uses [facility] for all medications Does not use pill box Pt endorses 100% compliance   We discussed:  [facility] is preferred pharmacy with patient's insurance; denies issues with services  Plan  Continue current med management strategy    Follow up: 6 month phone visit  Charlene Brooke, PharmD, BCACP Clinical Pharmacist [facility] at [facility] [phone]

## 2020-05-23 DIAGNOSIS — M25571 Pain in right ankle and joints of right foot: Secondary | ICD-10-CM | POA: Diagnosis not present

## 2020-05-28 DIAGNOSIS — M25571 Pain in right ankle and joints of right foot: Secondary | ICD-10-CM | POA: Diagnosis not present

## 2020-05-30 DIAGNOSIS — M25571 Pain in right ankle and joints of right foot: Secondary | ICD-10-CM | POA: Diagnosis not present

## 2020-05-31 ENCOUNTER — Ambulatory Visit: Payer: Medicare Other | Attending: Internal Medicine

## 2020-05-31 DIAGNOSIS — Z23 Encounter for immunization: Secondary | ICD-10-CM

## 2020-05-31 NOTE — Progress Notes (Signed)
   Covid-19 Vaccination Clinic  Name:  Desiree Andrews    MRN: [id number] DOB: 1955/11/24  05/31/2020  Desiree Andrews was observed post Covid-19 immunization for 15 minutes without incident. She was provided with Vaccine Information Sheet and instruction to access the V-Safe system.   Desiree Andrews was instructed to call 911 with any severe reactions post vaccine: Marland Kitchen Difficulty breathing  . Swelling of face and throat  . A fast heartbeat  . A bad rash all over body  . Dizziness and weakness

## 2020-06-07 ENCOUNTER — Other Ambulatory Visit: Payer: Self-pay | Admitting: Internal Medicine

## 2020-07-15 NOTE — Patient Instructions (Addendum)
Blood work was ordered.     No immunization administered today.   Medications changes include :     Your prescription(s) have been submitted to your pharmacy.    A referral was ordered for      Someone will call you to schedule an appointment.    Please followup in 6 months    Health Maintenance, Female Adopting a healthy lifestyle and getting preventive care are important in promoting health and wellness. Ask your health care provider about:  The right schedule for you to have regular tests and exams.  Things you can do on your own to prevent diseases and keep yourself healthy. What should I know about diet, weight, and exercise? Eat a healthy diet   Eat a diet that includes plenty of vegetables, fruits, low-fat dairy products, and lean protein.  Do not eat a lot of foods that are high in solid fats, added sugars, or sodium. Maintain a healthy weight Body mass index (BMI) is used to identify weight problems. It estimates body fat based on height and weight. Your health care provider can help determine your BMI and help you achieve or maintain a healthy weight. Get regular exercise Get regular exercise. This is one of the most important things you can do for your health. Most adults should:  Exercise for at least 150 minutes each week. The exercise should increase your heart rate and make you sweat (moderate-intensity exercise).  Do strengthening exercises at least twice a week. This is in addition to the moderate-intensity exercise.  Spend less time sitting. Even light physical activity can be beneficial. Watch cholesterol and blood lipids Have your blood tested for lipids and cholesterol at 66 years of age, then have this test every 5 years. Have your cholesterol levels checked more often if:  Your lipid or cholesterol levels are high.  You are older than 66 years of age.  You are at high risk for heart disease. What should I know about cancer  screening? Depending on your health history and family history, you may need to have cancer screening at various ages. This may include screening for:  Breast cancer.  Cervical cancer.  Colorectal cancer.  Skin cancer.  Lung cancer. What should I know about heart disease, diabetes, and high blood pressure? Blood pressure and heart disease  High blood pressure causes heart disease and increases the risk of stroke. This is more likely to develop in people who have high blood pressure readings, are of African descent, or are overweight.  Have your blood pressure checked: ? Every 3-5 years if you are 54-66 years of age. ? Every year if you are 62 years old or older. Diabetes Have regular diabetes screenings. This checks your fasting blood sugar level. Have the screening done:  Once every three years after age 4 if you are at a normal weight and have a low risk for diabetes.  More often and at a younger age if you are overweight or have a high risk for diabetes. What should I know about preventing infection? Hepatitis B If you have a higher risk for hepatitis B, you should be screened for this virus. Talk with your health care provider to find out if you are at risk for hepatitis B infection. Hepatitis C Testing is recommended for:  Everyone born from [birth year] through 1967.  Anyone with known risk factors for hepatitis C. Sexually transmitted infections (STIs)  Get screened for STIs, including gonorrhea and chlamydia, if: ? You are sexually  active and are younger than 66 years of age. ? You are older than 66 years of age and your health care provider tells you that you are at risk for this type of infection. ? Your sexual activity has changed since you were last screened, and you are at increased risk for chlamydia or gonorrhea. Ask your health care provider if you are at risk.  Ask your health care provider about whether you are at high risk for HIV. Your health care provider may  recommend a prescription medicine to help prevent HIV infection. If you choose to take medicine to prevent HIV, you should first get tested for HIV. You should then be tested every 3 months for as long as you are taking the medicine. Pregnancy  If you are about to stop having your period (premenopausal) and you may become pregnant, seek counseling before you get pregnant.  Take 400 to 800 micrograms (mcg) of folic acid every day if you become pregnant.  Ask for birth control (contraception) if you want to prevent pregnancy. Osteoporosis and menopause Osteoporosis is a disease in which the bones lose minerals and strength with aging. This can result in bone fractures. If you are 25 years old or older, or if you are at risk for osteoporosis and fractures, ask your health care provider if you should:  Be screened for bone loss.  Take a calcium or vitamin D supplement to lower your risk of fractures.  Be given hormone replacement therapy (HRT) to treat symptoms of menopause. Follow these instructions at home: Lifestyle  Do not use any products that contain nicotine or tobacco, such as cigarettes, e-cigarettes, and chewing tobacco. If you need help quitting, ask your health care provider.  Do not use street drugs.  Do not share needles.  Ask your health care provider for help if you need support or information about quitting drugs. Alcohol use  Do not drink alcohol if: ? Your health care provider tells you not to drink. ? You are pregnant, may be pregnant, or are planning to become pregnant.  If you drink alcohol: ? Limit how much you use to 0-1 drink a day. ? Limit intake if you are breastfeeding.  Be aware of how much alcohol is in your drink. In the [place] one drink equals one 12 oz bottle of beer (355 mL), one 5 oz glass of wine (148 mL), or one 1 oz glass of hard liquor (44 mL). General instructions  Schedule regular health, dental, and eye exams.  Stay current with your  vaccines.  Tell your health care provider if: ? You often feel depressed. ? You have ever been abused or do not feel safe at home. Summary  Adopting a healthy lifestyle and getting preventive care are important in promoting health and wellness.  Follow your health care provider's instructions about healthy diet, exercising, and getting tested or screened for diseases.  Follow your health care provider's instructions on monitoring your cholesterol and blood pressure. This information is not intended to replace advice given to you by your health care provider. Make sure you discuss any questions you have with your health care provider. Document Revised: 08/03/2018 Document Reviewed: 08/03/2018 Elsevier Patient Education  2020 [employer].

## 2020-07-15 NOTE — Progress Notes (Signed)
Subjective:    Patient ID: Desiree Andrews, female    DOB: 1956-01-28, 64 y.o.   MRN: [id number]   This visit occurred during the SARS-CoV-2 public health emergency.  Safety protocols were in place, including screening questions prior to the visit, additional usage of staff PPE, and extensive cleaning of exam room while observing appropriate contact time as indicated for disinfecting solutions.    HPI Desiree Andrews is here for a physical exam.     Medications and allergies reviewed with patient and updated if appropriate.  Patient Active Problem List   Diagnosis Date Noted  . Bilateral leg edema 12/20/2019  . Ankle swelling, right 04/11/2019  . Alcohol abuse 03/14/2019  . Lichen sclerosus 78/46/9629  . Prediabetes 02/17/2017  . Obese 02/17/2017  . 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 PARESTHESIA, HANDS 05/20/2009  . Vitamin D deficiency 12/18/2008  . Osteopenia 12/18/2008  . BREAST CANCER, HX OF 12/18/2008  . Dyslipidemia 10/19/2007  . Hepatitis C antibody test positive 10/19/2007  . Essential hypertension 06/07/2007    Current Outpatient Medications on File Prior to Visit  Medication Sig Dispense Refill  . amLODipine (NORVASC) 5 MG tablet TAKE 1 TABLET(5 MG) BY MOUTH DAILY 90 tablet 0  . amphetamine-dextroamphetamine (ADDERALL) 20 MG tablet Take 10 mg by mouth 2 (two) times daily.   0  . atorvastatin (LIPITOR) 10 MG tablet TAKE 1 TABLET(10 MG) BY MOUTH DAILY 90 tablet 1  . betamethasone dipropionate (DIPROLENE) 0.05 % ointment Apply topically 2 (two) times daily. 30 g 0  . cetirizine (ZYRTEC) 10 MG tablet Take 10 mg by mouth as needed for allergies.     . clobetasol cream (TEMOVATE) 5.28 % Apply 1 application topically 2 (two) times daily as needed. 30 g 2  . fenofibrate (TRICOR) 145 MG tablet TAKE 1 TABLET(145 MG) BY MOUTH DAILY 90 tablet 1  . hydrochlorothiazide (HYDRODIURIL) 25 MG tablet TAKE 1 TABLET(25 MG) BY  MOUTH DAILY 90 tablet 0  . ibuprofen (ADVIL) 600 MG tablet Take 600 mg by mouth every 8 (eight) hours as needed.    . metoprolol tartrate (LOPRESSOR) 50 MG tablet TAKE 1 AND 1/2 TABLETS(75 MG) BY MOUTH TWICE DAILY 270 tablet 1  . traZODone (DESYREL) 50 MG tablet Take 100 mg by mouth at bedtime as needed for sleep.     Marland Kitchen tretinoin (RETIN-A) 0.1 % cream Apply topically at bedtime. 45 g 6   No current facility-administered medications on file prior to visit.    Past Medical History:  Diagnosis Date  . Allergy   . Anxiety   . Cancer ([place])    breast-left   . Cardiopulmonary arrest ([place]) 1991   Postpartum,? Pulmonary thromboembolism  . Depression    off cymbalta now  . Hyperlipidemia   . Hypertension     Past Surgical History:  Procedure Laterality Date  . BREAST LUMPECTOMY  2003   snbx-Left, Dr.Rubin; Radiation & Chemotherapy X3  . BREAST LUMPECTOMY WITH NEEDLE LOCALIZATION Right 11/30/2012   Procedure: BREAST LUMPECTOMY WITH NEEDLE LOCALIZATION;  Surgeon: Marcello Moores A. Cornett, MD;  Location: [facility];  Service: General;  Laterality: Right;  needle localization at [place] 8:00   . CARDIOVASCULAR STRESS TEST  2005   Neg  . COLONOSCOPY  2006   Normal  . ORIF ANKLE FRACTURE Right 08/22/2019   Procedure: OPEN REDUCTION INTERNAL FIXATION (ORIF) RIGHT ANKLE FRACTURE;  Surgeon: Erle Crocker, MD;  Location: [facility];  Service: Orthopedics;  Laterality: Right;  . WRIST SURGERY  2012   Left, Dr.Rowen    Social History   Socioeconomic History  . Marital status: Divorced    Spouse name: Not on file  . Number of children: Not on file  . Years of education: Not on file  . Highest education level: Not on file  Occupational History  . Not on file  Tobacco Use  . Smoking status: Former Smoker    Quit date: 08/24/1990    Years since quitting: 29.9  . Smokeless tobacco: Never Used  . Tobacco comment: smoked [place], up to < 1 ppd   Vaping Use  . Vaping Use: Never used  Substance and Sexual Activity  . Alcohol use: Yes    Alcohol/week: 14.0 standard drinks    Types: 14 Glasses of wine per week    Comment: Socially  . Drug use: No  . Sexual activity: Not on file  Other Topics Concern  . Not on file  Social History Narrative  . Not on file   Social Determinants of Health   Financial Resource Strain: Low Risk   . Difficulty of Paying Living Expenses: Not very hard  Food Insecurity:   . Worried About Charity fundraiser in the Last Year: Not on file  . Ran Out of Food in the Last Year: Not on file  Transportation Needs:   . Lack of Transportation (Medical): Not on file  . Lack of Transportation (Non-Medical): Not on file  Physical Activity:   . Days of Exercise per Week: Not on file  . Minutes of Exercise per Session: Not on file  Stress:   . Feeling of Stress : Not on file  Social Connections:   . Frequency of Communication with Friends and Family: Not on file  . Frequency of Social Gatherings with Friends and Family: Not on file  . Attends Religious Services: Not on file  . Active Member of Clubs or Organizations: Not on file  . Attends Archivist Meetings: Not on file  . Marital Status: Not on file    Family History  Problem Relation Age of Onset  . Diabetes Mother   . Hypertension Mother   . Hyperlipidemia Mother   . Stroke Mother 51       02/2009  . Heart attack Maternal Grandmother        in Desiree Andrews 60s  . Stomach cancer Maternal Grandfather 81  . Colon cancer Neg Hx   . Rectal cancer Neg Hx   . Colon polyps Neg Hx   . Esophageal cancer Neg Hx     Review of Systems     Objective:  There were no vitals filed for this visit. There were no vitals filed for this visit. There is no height or weight on file to calculate BMI.  BP Readings from Last 3 Encounters:  01/12/20 (!) 144/80  12/20/19 (!) 158/70  08/22/19 135/69    Wt Readings from Last 3 Encounters:  01/12/20 172  lb (78 kg)  12/20/19 172 lb 6.4 oz (78.2 kg)  08/22/19 169 lb 15.6 oz (77.1 kg)     Physical Exam Constitutional: Desiree Andrews appears well-developed and well-nourished. No distress.  HENT:  Head: Normocephalic and atraumatic.  Right Ear: External ear normal. Normal ear canal and TM Left Ear: External ear normal.  Normal ear canal and TM Mouth/Throat: Oropharynx is clear and moist.  Eyes: Conjunctivae and EOM are normal.  Neck: Neck supple. No tracheal deviation present. No thyromegaly present.  No carotid bruit  Cardiovascular: Normal rate, regular rhythm and normal heart sounds.   No murmur heard.  No edema. Pulmonary/Chest: Effort normal and breath sounds normal. No respiratory distress. Desiree Andrews has no wheezes. Desiree Andrews has no rales.  Breast: deferred   Abdominal: Soft. Desiree Andrews exhibits no distension. There is no tenderness.  Lymphadenopathy: Desiree Andrews has no cervical adenopathy.  Skin: Skin is warm and dry. Desiree Andrews is not diaphoretic.  Psychiatric: Desiree Andrews has a normal mood and affect. Desiree Andrews behavior is normal.        Assessment & Plan:   Physical exam: Screening blood work    ordered Immunizations  Flu vaccine today, others up to date Colonoscopy  Up to date  Mammogram  Up to date  Gyn   Dexa  Up to date  Eye exams   Exercise   Weight   Substance abuse  ? Wine at night      See Problem List for Assessment and Plan of chronic medical problems.      This encounter was created in error - please disregard.

## 2020-07-16 ENCOUNTER — Encounter: Payer: Medicare Other | Admitting: Internal Medicine

## 2020-07-16 DIAGNOSIS — Z0000 Encounter for general adult medical examination without abnormal findings: Secondary | ICD-10-CM

## 2020-07-16 DIAGNOSIS — I10 Essential (primary) hypertension: Secondary | ICD-10-CM

## 2020-07-16 DIAGNOSIS — E785 Hyperlipidemia, unspecified: Secondary | ICD-10-CM

## 2020-07-16 DIAGNOSIS — M8589 Other specified disorders of bone density and structure, multiple sites: Secondary | ICD-10-CM

## 2020-07-16 DIAGNOSIS — Z0289 Encounter for other administrative examinations: Secondary | ICD-10-CM

## 2020-07-16 DIAGNOSIS — R768 Other specified abnormal immunological findings in serum: Secondary | ICD-10-CM

## 2020-07-16 DIAGNOSIS — R7303 Prediabetes: Secondary | ICD-10-CM

## 2020-07-30 NOTE — Patient Instructions (Addendum)
Get the flu vaccine and pneumovax at your pharmacy.   Blood work was ordered.     No immunization administered today.   Medications changes include :   Increase atorvastatin to 20 mg daily  Your prescription(s) have been submitted to your pharmacy.      Please followup in 6 months    Health Maintenance, Female Adopting a healthy lifestyle and getting preventive care are important in promoting health and wellness. Ask your health care provider about:  The right schedule for you to have regular tests and exams.  Things you can do on your own to prevent diseases and keep yourself healthy. What should I know about diet, weight, and exercise? Eat a healthy diet   Eat a diet that includes plenty of vegetables, fruits, low-fat dairy products, and lean protein.  Do not eat a lot of foods that are high in solid fats, added sugars, or sodium. Maintain a healthy weight Body mass index (BMI) is used to identify weight problems. It estimates body fat based on height and weight. Your health care provider can help determine your BMI and help you achieve or maintain a healthy weight. Get regular exercise Get regular exercise. This is one of the most important things you can do for your health. Most adults should:  Exercise for at least 150 minutes each week. The exercise should increase your heart rate and make you sweat (moderate-intensity exercise).  Do strengthening exercises at least twice a week. This is in addition to the moderate-intensity exercise.  Spend less time sitting. Even light physical activity can be beneficial. Watch cholesterol and blood lipids Have your blood tested for lipids and cholesterol at 66 years of age, then have this test every 5 years. Have your cholesterol levels checked more often if:  Your lipid or cholesterol levels are high.  You are older than 66 years of age.  You are at high risk for heart disease. What should I know about cancer  screening? Depending on your health history and family history, you may need to have cancer screening at various ages. This may include screening for:  Breast cancer.  Cervical cancer.  Colorectal cancer.  Skin cancer.  Lung cancer. What should I know about heart disease, diabetes, and high blood pressure? Blood pressure and heart disease  High blood pressure causes heart disease and increases the risk of stroke. This is more likely to develop in people who have high blood pressure readings, are of African descent, or are overweight.  Have your blood pressure checked: ? Every 3-5 years if you are 55-86 years of age. ? Every year if you are 10 years old or older. Diabetes Have regular diabetes screenings. This checks your fasting blood sugar level. Have the screening done:  Once every three years after age 19 if you are at a normal weight and have a low risk for diabetes.  More often and at a younger age if you are overweight or have a high risk for diabetes. What should I know about preventing infection? Hepatitis B If you have a higher risk for hepatitis B, you should be screened for this virus. Talk with your health care provider to find out if you are at risk for hepatitis B infection. Hepatitis C Testing is recommended for:  Everyone born from [birth year] through 1967.  Anyone with known risk factors for hepatitis C. Sexually transmitted infections (STIs)  Get screened for STIs, including gonorrhea and chlamydia, if: ? You are sexually active and are  younger than 66 years of age. ? You are older than 66 years of age and your health care provider tells you that you are at risk for this type of infection. ? Your sexual activity has changed since you were last screened, and you are at increased risk for chlamydia or gonorrhea. Ask your health care provider if you are at risk.  Ask your health care provider about whether you are at high risk for HIV. Your health care provider may  recommend a prescription medicine to help prevent HIV infection. If you choose to take medicine to prevent HIV, you should first get tested for HIV. You should then be tested every 3 months for as long as you are taking the medicine. Pregnancy  If you are about to stop having your period (premenopausal) and you may become pregnant, seek counseling before you get pregnant.  Take 400 to 800 micrograms (mcg) of folic acid every day if you become pregnant.  Ask for birth control (contraception) if you want to prevent pregnancy. Osteoporosis and menopause Osteoporosis is a disease in which the bones lose minerals and strength with aging. This can result in bone fractures. If you are 3 years old or older, or if you are at risk for osteoporosis and fractures, ask your health care provider if you should:  Be screened for bone loss.  Take a calcium or vitamin D supplement to lower your risk of fractures.  Be given hormone replacement therapy (HRT) to treat symptoms of menopause. Follow these instructions at home: Lifestyle  Do not use any products that contain nicotine or tobacco, such as cigarettes, e-cigarettes, and chewing tobacco. If you need help quitting, ask your health care provider.  Do not use street drugs.  Do not share needles.  Ask your health care provider for help if you need support or information about quitting drugs. Alcohol use  Do not drink alcohol if: ? Your health care provider tells you not to drink. ? You are pregnant, may be pregnant, or are planning to become pregnant.  If you drink alcohol: ? Limit how much you use to 0-1 drink a day. ? Limit intake if you are breastfeeding.  Be aware of how much alcohol is in your drink. In the [place] one drink equals one 12 oz bottle of beer (355 mL), one 5 oz glass of wine (148 mL), or one 1 oz glass of hard liquor (44 mL). General instructions  Schedule regular health, dental, and eye exams.  Stay current with your  vaccines.  Tell your health care provider if: ? You often feel depressed. ? You have ever been abused or do not feel safe at home. Summary  Adopting a healthy lifestyle and getting preventive care are important in promoting health and wellness.  Follow your health care provider's instructions about healthy diet, exercising, and getting tested or screened for diseases.  Follow your health care provider's instructions on monitoring your cholesterol and blood pressure. This information is not intended to replace advice given to you by your health care provider. Make sure you discuss any questions you have with your health care provider. Document Revised: 08/03/2018 Document Reviewed: 08/03/2018 Elsevier Patient Education  2020 [employer].

## 2020-07-30 NOTE — Progress Notes (Signed)
Subjective:    Patient ID: Desiree Andrews, female    DOB: 1956-07-28, 64 y.o.   MRN: [id number]   This visit occurred during the SARS-CoV-2 public health emergency.  Safety protocols were in place, including screening questions prior to the visit, additional usage of staff PPE, and extensive cleaning of exam room while observing appropriate contact time as indicated for disinfecting solutions.    HPI She is here for a physical exam.   She denies changes.   She is not sleeping well.  She does follow with psych and they have discussed this. Her psychiatrist wanted her to try a new med but she had not yet.   Medications and allergies reviewed with patient and updated if appropriate.  Patient Active Problem List   Diagnosis Date Noted  . Bilateral leg edema 12/20/2019  . Alcohol abuse 03/14/2019  . Lichen sclerosus 21/97/5883  . Prediabetes 02/17/2017  . Obese 02/17/2017  . 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 PARESTHESIA, HANDS 05/20/2009  . Vitamin D deficiency 12/18/2008  . Osteopenia 12/18/2008  . BREAST CANCER, HX OF 12/18/2008  . Dyslipidemia 10/19/2007  . Hepatitis C antibody test positive 10/19/2007  . Essential hypertension 06/07/2007    Current Outpatient Medications on File Prior to Visit  Medication Sig Dispense Refill  . amLODipine (NORVASC) 2.5 MG tablet Take 2.5 mg by mouth daily.    Marland Kitchen amphetamine-dextroamphetamine (ADDERALL) 20 MG tablet Take 10 mg by mouth 2 (two) times daily.   0  . atorvastatin (LIPITOR) 10 MG tablet TAKE 1 TABLET(10 MG) BY MOUTH DAILY 90 tablet 1  . betamethasone dipropionate (DIPROLENE) 0.05 % ointment Apply topically 2 (two) times daily. 30 g 0  . cetirizine (ZYRTEC) 10 MG tablet Take 10 mg by mouth as needed for allergies.     . clobetasol cream (TEMOVATE) 2.54 % Apply 1 application topically 2 (two) times daily as needed. 30 g 2  . fenofibrate (TRICOR) 145 MG tablet TAKE 1 TABLET(145 MG) BY MOUTH DAILY 90 tablet 1  .  hydrochlorothiazide (HYDRODIURIL) 25 MG tablet TAKE 1 TABLET(25 MG) BY MOUTH DAILY 90 tablet 0  . ibuprofen (ADVIL) 600 MG tablet Take 600 mg by mouth every 8 (eight) hours as needed.    . metoprolol tartrate (LOPRESSOR) 50 MG tablet TAKE 1 AND 1/2 TABLETS(75 MG) BY MOUTH TWICE DAILY 270 tablet 1  . traZODone (DESYREL) 50 MG tablet Take 100 mg by mouth at bedtime as needed for sleep.     Marland Kitchen tretinoin (RETIN-A) 0.1 % cream Apply topically at bedtime. 45 g 6   No current facility-administered medications on file prior to visit.    Past Medical History:  Diagnosis Date  . Allergy   . Anxiety   . Cancer ([place])    breast-left   . Cardiopulmonary arrest ([place]) 1991   Postpartum,? Pulmonary thromboembolism  . Depression    off cymbalta now  . Hyperlipidemia   . Hypertension     Past Surgical History:  Procedure Laterality Date  . BREAST LUMPECTOMY  2003   snbx-Left, Dr.Rubin; Radiation & Chemotherapy X3  . BREAST LUMPECTOMY WITH NEEDLE LOCALIZATION Right 11/30/2012   Procedure: BREAST LUMPECTOMY WITH NEEDLE LOCALIZATION;  Surgeon: Marcello Moores A. Cornett, MD;  Location: [facility];  Service: General;  Laterality: Right;  needle localization at [place] 8:00   . CARDIOVASCULAR STRESS TEST  2005   Neg  . COLONOSCOPY  2006   Normal  . ORIF ANKLE FRACTURE Right  08/22/2019   Procedure: OPEN REDUCTION INTERNAL FIXATION (ORIF) RIGHT ANKLE FRACTURE;  Surgeon: Erle Crocker, MD;  Location: [facility];  Service: Orthopedics;  Laterality: Right;  . WRIST SURGERY  2012   Left, Dr.Rowen    Social History   Socioeconomic History  . Marital status: Divorced    Spouse name: Not on file  . Number of children: Not on file  . Years of education: Not on file  . Highest education level: Not on file  Occupational History  . Not on file  Tobacco Use  . Smoking status: Former Smoker    Quit date: 08/24/1990    Years since quitting: 29.9  . Smokeless tobacco:  Never Used  . Tobacco comment: smoked [place], up to < 1 ppd  Vaping Use  . Vaping Use: Never used  Substance and Sexual Activity  . Alcohol use: Yes    Alcohol/week: 14.0 standard drinks    Types: 14 Glasses of wine per week    Comment: Socially  . Drug use: No  . Sexual activity: Not on file  Other Topics Concern  . Not on file  Social History Narrative  . Not on file   Social Determinants of Health   Financial Resource Strain: Low Risk   . Difficulty of Paying Living Expenses: Not very hard  Food Insecurity:   . Worried About Charity fundraiser in the Last Year: Not on file  . Ran Out of Food in the Last Year: Not on file  Transportation Needs:   . Lack of Transportation (Medical): Not on file  . Lack of Transportation (Non-Medical): Not on file  Physical Activity:   . Days of Exercise per Week: Not on file  . Minutes of Exercise per Session: Not on file  Stress:   . Feeling of Stress : Not on file  Social Connections:   . Frequency of Communication with Friends and Family: Not on file  . Frequency of Social Gatherings with Friends and Family: Not on file  . Attends Religious Services: Not on file  . Active Member of Clubs or Organizations: Not on file  . Attends Archivist Meetings: Not on file  . Marital Status: Not on file    Family History  Problem Relation Age of Onset  . Diabetes Mother   . Hypertension Mother   . Hyperlipidemia Mother   . Stroke Mother 82       02/2009  . Heart attack Maternal Grandmother        in her 11s  . Stomach cancer Maternal Grandfather 50  . Colon cancer Neg Hx   . Rectal cancer Neg Hx   . Colon polyps Neg Hx   . Esophageal cancer Neg Hx     Review of Systems  Constitutional: Negative for chills and fever.  Eyes: Negative for visual disturbance.  Respiratory: Negative for cough, shortness of breath and wheezing.   Cardiovascular: Positive for leg swelling (intermittent, mild). Negative for chest pain and  palpitations.  Gastrointestinal: Negative for abdominal pain, blood in stool, constipation, diarrhea and nausea.       No gerd  Genitourinary: Negative for dysuria and hematuria.  Musculoskeletal: Negative for arthralgias and back pain.  Skin: Negative for color change and rash.  Neurological: Negative for light-headedness and headaches.  Psychiatric/Behavioral: Positive for dysphoric mood (mild) and sleep disturbance. The patient is not nervous/anxious.        Objective:   Vitals:   07/31/20 1310  BP: 140/82  Pulse: 84  Temp: 98.2 F (36.8 C)  SpO2: 99%   Filed Weights   07/31/20 1310  Weight: 172 lb (78 kg)   Body mass index is 30.47 kg/m.  BP Readings from Last 3 Encounters:  07/31/20 140/82  01/12/20 (!) 144/80  12/20/19 (!) 158/70    Wt Readings from Last 3 Encounters:  07/31/20 172 lb (78 kg)  01/12/20 172 lb (78 kg)  12/20/19 172 lb 6.4 oz (78.2 kg)     Physical Exam Constitutional: She appears well-developed and well-nourished. No distress.  HENT:  Head: Normocephalic and atraumatic.  Right Ear: External ear normal. Normal ear canal and TM Left Ear: External ear normal.  Normal ear canal and TM Mouth/Throat: Oropharynx is clear and moist.  Eyes: Conjunctivae and EOM are normal.  Neck: Neck supple. No tracheal deviation present. No thyromegaly present.  No carotid bruit  Cardiovascular: Normal rate, regular rhythm and normal heart sounds.   No murmur heard.  No edema. Pulmonary/Chest: Effort normal and breath sounds normal. No respiratory distress. She has no wheezes. She has no rales.  Breast: deferred   Abdominal: Soft. Obese. She exhibits no distension. There is no tenderness.  Lymphadenopathy: She has no cervical adenopathy.  Skin: Skin is warm and dry. She is not diaphoretic.  Psychiatric: She has a normal mood and affect. Her behavior is normal.        Assessment & Plan:   Physical exam: Screening blood work    ordered Immunizations  Will  get Flu vac at [facility], pneumovax at [facility] Colonoscopy  Up to date  Mammogram  Up to date  Gyn  Due - will schedule Dexa  Up to date  Eye exams  Due - will schedule Exercise  none Weight  Advised weight loss Substance abuse   Wine intake 2/night - advised cutting back Saw derm 04/2020     See Problem List for Assessment and Plan of chronic medical problems.

## 2020-07-31 ENCOUNTER — Ambulatory Visit (INDEPENDENT_AMBULATORY_CARE_PROVIDER_SITE_OTHER): Payer: Medicare Other | Admitting: Internal Medicine

## 2020-07-31 ENCOUNTER — Encounter: Payer: Self-pay | Admitting: Internal Medicine

## 2020-07-31 ENCOUNTER — Other Ambulatory Visit: Payer: Self-pay

## 2020-07-31 VITALS — BP 140/82 | HR 84 | Temp 98.2°F | Ht 63.0 in | Wt 172.0 lb

## 2020-07-31 DIAGNOSIS — Z0000 Encounter for general adult medical examination without abnormal findings: Secondary | ICD-10-CM

## 2020-07-31 DIAGNOSIS — G479 Sleep disorder, unspecified: Secondary | ICD-10-CM

## 2020-07-31 DIAGNOSIS — M8589 Other specified disorders of bone density and structure, multiple sites: Secondary | ICD-10-CM

## 2020-07-31 DIAGNOSIS — R600 Localized edema: Secondary | ICD-10-CM

## 2020-07-31 DIAGNOSIS — I10 Essential (primary) hypertension: Secondary | ICD-10-CM

## 2020-07-31 DIAGNOSIS — R7303 Prediabetes: Secondary | ICD-10-CM | POA: Diagnosis not present

## 2020-07-31 DIAGNOSIS — E6609 Other obesity due to excess calories: Secondary | ICD-10-CM

## 2020-07-31 DIAGNOSIS — E785 Hyperlipidemia, unspecified: Secondary | ICD-10-CM | POA: Diagnosis not present

## 2020-07-31 DIAGNOSIS — E559 Vitamin D deficiency, unspecified: Secondary | ICD-10-CM

## 2020-07-31 DIAGNOSIS — Z6831 Body mass index (BMI) 31.0-31.9, adult: Secondary | ICD-10-CM

## 2020-07-31 DIAGNOSIS — L900 Lichen sclerosus et atrophicus: Secondary | ICD-10-CM

## 2020-07-31 MED ORDER — ATORVASTATIN CALCIUM 20 MG PO TABS: 20.0000 mg | ORAL_TABLET | Freq: Every day | ORAL | 3 refills | Status: DC

## 2020-07-31 NOTE — Assessment & Plan Note (Signed)
Chronic dexa up to date Advised regular exercise Check vitamin d level

## 2020-07-31 NOTE — Assessment & Plan Note (Signed)
Chronic Controlled, improved Continue hctz 25 mg daily

## 2020-07-31 NOTE — Assessment & Plan Note (Addendum)
Uses steroid cream as needed Follow up with gyn

## 2020-07-31 NOTE — Assessment & Plan Note (Addendum)
Chronic Check a1c Low sugar / carb diet Stressed regular exercise Advised weight loss 

## 2020-07-31 NOTE — Assessment & Plan Note (Addendum)
Chronic Check lipid panel  Continue lipitor but increase to 20 mg based on past labs Discussed that the fenofibrate is not doing much, but she just had it refilled-advised her to finish off what she has and then we will try Vascepa if covered by her insurance Regular exercise and healthy diet encouraged  Advised weight loss Discussed the importance of decreasing her alcohol intake to improve her triglycerides

## 2020-07-31 NOTE — Assessment & Plan Note (Signed)
Discussed importance of weight loss ?Stressed regular exercise - goal 30 minutes 5 days a week ?Discussed decreasing portions, decreasing sugars/carbs ?Increase veges, lean protin ? ? ?

## 2020-07-31 NOTE — Assessment & Plan Note (Addendum)
Chronic Managed by Dr Kaur-discussed with her that she needs to follow-up with her to discuss further medication options I did advise that decreasing her alcohol intake will likely improve her sleep as well increasing her exercise

## 2020-07-31 NOTE — Assessment & Plan Note (Signed)
Chronic BP controlled Continue amlodipine 2.5 mg daily, hctz 25 mg daily, metorpolol 75 mg BID cmp

## 2020-07-31 NOTE — Assessment & Plan Note (Signed)
Chronic Taking vitamin D daily Check vitamin D level  

## 2020-08-09 ENCOUNTER — Other Ambulatory Visit (INDEPENDENT_AMBULATORY_CARE_PROVIDER_SITE_OTHER): Payer: Medicare Other

## 2020-08-09 DIAGNOSIS — E6609 Other obesity due to excess calories: Secondary | ICD-10-CM | POA: Diagnosis not present

## 2020-08-09 DIAGNOSIS — E559 Vitamin D deficiency, unspecified: Secondary | ICD-10-CM | POA: Diagnosis not present

## 2020-08-09 DIAGNOSIS — Z6831 Body mass index (BMI) 31.0-31.9, adult: Secondary | ICD-10-CM | POA: Diagnosis not present

## 2020-08-09 DIAGNOSIS — R7303 Prediabetes: Secondary | ICD-10-CM | POA: Diagnosis not present

## 2020-08-09 DIAGNOSIS — I10 Essential (primary) hypertension: Secondary | ICD-10-CM

## 2020-08-09 DIAGNOSIS — Z0000 Encounter for general adult medical examination without abnormal findings: Secondary | ICD-10-CM

## 2020-08-09 DIAGNOSIS — E785 Hyperlipidemia, unspecified: Secondary | ICD-10-CM | POA: Diagnosis not present

## 2020-08-09 LAB — LIPID PANEL
Cholesterol: 176 mg/dL (ref 0–200)
HDL: 48.6 mg/dL (ref >39.00)
NonHDL: 127.45
Total CHOL/HDL Ratio: 4
Triglycerides: 204 mg/dL — ABNORMAL HIGH (ref 0.0–149.0)
VLDL: 40.8 mg/dL — ABNORMAL HIGH (ref 0.0–40.0)

## 2020-08-09 LAB — CBC WITH DIFFERENTIAL/PLATELET
Basophils Absolute: 0.1 10*3/uL (ref 0.0–0.1)
Basophils Relative: 1 % (ref 0.0–3.0)
Eosinophils Absolute: 0.4 10*3/uL (ref 0.0–0.7)
Eosinophils Relative: 4.5 % (ref 0.0–5.0)
HCT: 41 % (ref 36.0–46.0)
Hemoglobin: 14.1 g/dL (ref 12.0–15.0)
Lymphocytes Relative: 33.3 % (ref 12.0–46.0)
Lymphs Abs: 2.6 10*3/uL (ref 0.7–4.0)
MCHC: 34.3 g/dL (ref 30.0–36.0)
MCV: 90.2 fl (ref 78.0–100.0)
Monocytes Absolute: 0.6 10*3/uL (ref 0.1–1.0)
Monocytes Relative: 8.1 % (ref 3.0–12.0)
Neutro Abs: 4.2 10*3/uL (ref 1.4–7.7)
Neutrophils Relative %: 53.1 % (ref 43.0–77.0)
Platelets: 334 10*3/uL (ref 150.0–400.0)
RBC: 4.54 Mil/uL (ref 3.87–5.11)
RDW: 13.4 % (ref 11.5–15.5)
WBC: 7.9 10*3/uL (ref 4.0–10.5)

## 2020-08-09 LAB — TSH: TSH: 1.37 u[IU]/mL (ref 0.35–4.50)

## 2020-08-09 LAB — COMPREHENSIVE METABOLIC PANEL
ALT: 23 U/L (ref 0–35)
AST: 23 U/L (ref 0–37)
Albumin: 4.6 g/dL (ref 3.5–5.2)
Alkaline Phosphatase: 37 U/L — ABNORMAL LOW (ref 39–117)
BUN: 19 mg/dL (ref 6–23)
CO2: 26 mEq/L (ref 19–32)
Calcium: 9.8 mg/dL (ref 8.4–10.5)
Chloride: 102 mEq/L (ref 96–112)
Creatinine, Ser: 0.84 mg/dL (ref 0.40–1.20)
GFR: 72.47 mL/min (ref >60.00)
Glucose, Bld: 118 mg/dL — ABNORMAL HIGH (ref 70–99)
Potassium: 3.5 mEq/L (ref 3.5–5.1)
Sodium: 138 mEq/L (ref 135–145)
Total Bilirubin: 0.5 mg/dL (ref 0.2–1.2)
Total Protein: 7.4 g/dL (ref 6.0–8.3)

## 2020-08-09 LAB — HEMOGLOBIN A1C: Hgb A1c MFr Bld: 6.4 % (ref 4.6–6.5)

## 2020-08-09 LAB — LDL CHOLESTEROL, DIRECT: Direct LDL: 108 mg/dL

## 2020-08-09 LAB — VITAMIN D 25 HYDROXY (VIT D DEFICIENCY, FRACTURES): VITD: 28.68 ng/mL — ABNORMAL LOW (ref 30.00–100.00)

## 2020-09-05 ENCOUNTER — Other Ambulatory Visit: Payer: Self-pay | Admitting: Internal Medicine

## 2020-09-24 ENCOUNTER — Other Ambulatory Visit: Payer: Self-pay | Admitting: Internal Medicine

## 2020-10-09 ENCOUNTER — Other Ambulatory Visit: Payer: Self-pay | Admitting: Internal Medicine

## 2020-10-16 ENCOUNTER — Other Ambulatory Visit: Payer: Self-pay | Admitting: Internal Medicine

## 2020-10-31 ENCOUNTER — Other Ambulatory Visit: Payer: Self-pay | Admitting: Internal Medicine

## 2020-11-19 ENCOUNTER — Telehealth: Payer: Medicare Other

## 2020-12-04 ENCOUNTER — Other Ambulatory Visit: Payer: Self-pay | Admitting: Internal Medicine

## 2020-12-25 DIAGNOSIS — M25571 Pain in right ankle and joints of right foot: Secondary | ICD-10-CM | POA: Diagnosis not present

## 2020-12-30 DIAGNOSIS — M25571 Pain in right ankle and joints of right foot: Secondary | ICD-10-CM | POA: Diagnosis not present

## 2021-01-03 DIAGNOSIS — M25571 Pain in right ankle and joints of right foot: Secondary | ICD-10-CM | POA: Diagnosis not present

## 2021-01-06 DIAGNOSIS — M25571 Pain in right ankle and joints of right foot: Secondary | ICD-10-CM | POA: Diagnosis not present

## 2021-01-10 DIAGNOSIS — M25571 Pain in right ankle and joints of right foot: Secondary | ICD-10-CM | POA: Diagnosis not present

## 2021-01-16 ENCOUNTER — Telehealth: Payer: Self-pay | Admitting: Pharmacist

## 2021-01-16 NOTE — Chronic Care Management (AMB) (Signed)
Chronic Care Management Pharmacy Assistant   Name: Desiree Andrews  MRN: [id number] DOB: 02-01-1956   Reason for Encounter: Disease State Hypertension   Conditions to be addressed/monitored: HTN   Recent office visits:  07/31/20 Dr. Quay Burow  Recent consult visits:  None ID  Hospital visits:  None in previous 6 months  Medications: Outpatient Encounter Medications as of 01/16/2021  Medication Sig  . amLODipine (NORVASC) 2.5 MG tablet TAKE 1 TABLET(2.5 MG) BY MOUTH DAILY  . amphetamine-dextroamphetamine (ADDERALL) 20 MG tablet Take 10 mg by mouth 2 (two) times daily.   Marland Kitchen atorvastatin (LIPITOR) 20 MG tablet Take 1 tablet (20 mg total) by mouth daily.  . betamethasone dipropionate (DIPROLENE) 0.05 % ointment Apply topically 2 (two) times daily.  . cetirizine (ZYRTEC) 10 MG tablet Take 10 mg by mouth as needed for allergies.   . clobetasol cream (TEMOVATE) 0.34 % Apply 1 application topically 2 (two) times daily as needed.  . fenofibrate (TRICOR) 145 MG tablet TAKE 1 TABLET(145 MG) BY MOUTH DAILY  . hydrochlorothiazide (HYDRODIURIL) 25 MG tablet TAKE 1 TABLET(25 MG) BY MOUTH DAILY  . ibuprofen (ADVIL) 600 MG tablet Take 600 mg by mouth every 8 (eight) hours as needed.  . metoprolol tartrate (LOPRESSOR) 50 MG tablet TAKE 1 AND 1/2 TABLETS(75 MG) BY MOUTH TWICE DAILY  . traZODone (DESYREL) 50 MG tablet Take 100 mg by mouth at bedtime as needed for sleep.   Marland Kitchen tretinoin (RETIN-A) 0.1 % cream Apply topically at bedtime.   No facility-administered encounter medications on file as of 01/16/2021.    Reviewed chart prior to disease state call. Spoke with patient regarding BP  Recent Office Vitals: BP Readings from Last 3 Encounters:  07/31/20 140/82  01/12/20 (!) 144/80  12/20/19 (!) 158/70   Pulse Readings from Last 3 Encounters:  07/31/20 84  01/12/20 79  12/20/19 72    Wt Readings from Last 3 Encounters:  07/31/20 172 lb (78 kg)  01/12/20 172 lb (78 kg)  12/20/19 172  lb 6.4 oz (78.2 kg)     Kidney Function Lab Results  Component Value Date/Time   CREATININE 0.84 08/09/2020 08:30 AM   CREATININE 1.04 01/12/2020 02:37 PM   GFR 72.47 08/09/2020 08:30 AM   GFRNONAA >60 08/17/2019 10:27 AM   GFRAA >60 08/17/2019 10:27 AM    BMP Latest Ref Rng & Units 08/09/2020 01/12/2020 12/20/2019  Glucose 70 - 99 mg/dL 118(H) 131(H) 183(H)  BUN 6 - 23 mg/dL 19 20 17   Creatinine 0.40 - 1.20 mg/dL 0.84 1.04 0.98  Sodium 135 - 145 mEq/L 138 137 137  Potassium 3.5 - 5.1 mEq/L 3.5 3.9 4.1  Chloride 96 - 112 mEq/L 102 98 102  CO2 19 - 32 mEq/L 26 31 28   Calcium 8.4 - 10.5 mg/dL 9.8 9.9 9.7    . Current antihypertensive regimen:  Amlodipine 2.5 mg daily Metoprolol tartrate 50 mg 1 and 1/2 tab daily  . How often are you checking your Blood Pressure? Patient states that she does not check blood pressure. Asked did she have a blood pressure cuff, patient stated yes. Asked the patient if she can start to check her blood pressure at least once daily and take reading to next appointment with Dr. Quay Burow   . Current home BP readings: Patient does not have any recent blood pressure reading  . What recent interventions/DTPs have been made by any provider to improve Blood Pressure control since last CPP Visit: None ID  . Any  recent hospitalizations or ED visits since last visit with CPP? No   . What diet changes have been made to improve Blood Pressure Control?  Patient states that she has not made any changes to diet  . What exercise is being done to improve your Blood Pressure Control?  Patient states that she is walking a lot more for exercise  Adherence Review: Is the patient currently on ACE/ARB medication? No Does the patient have >5 day gap between last estimated fill dates? No    Star Rating Drugs: Atorvastatin 12/24/20 90 d  Ethelene Hal Clinical Pharmacist Assistant [phone]  Time spent:20

## 2021-01-24 DIAGNOSIS — M25571 Pain in right ankle and joints of right foot: Secondary | ICD-10-CM | POA: Diagnosis not present

## 2021-02-07 ENCOUNTER — Telehealth: Payer: Self-pay | Admitting: Internal Medicine

## 2021-02-07 DIAGNOSIS — E559 Vitamin D deficiency, unspecified: Secondary | ICD-10-CM

## 2021-02-07 DIAGNOSIS — R7303 Prediabetes: Secondary | ICD-10-CM

## 2021-02-07 DIAGNOSIS — I10 Essential (primary) hypertension: Secondary | ICD-10-CM

## 2021-02-07 DIAGNOSIS — E785 Hyperlipidemia, unspecified: Secondary | ICD-10-CM

## 2021-02-07 NOTE — Telephone Encounter (Signed)
Patient is requesting lab orders before her 6 mo fu on 02-14-21 at 10:40AM. Please advise

## 2021-02-09 NOTE — Telephone Encounter (Signed)
ordered

## 2021-02-14 ENCOUNTER — Ambulatory Visit: Payer: Medicare Other | Admitting: Internal Medicine

## 2021-03-04 ENCOUNTER — Other Ambulatory Visit: Payer: Self-pay | Admitting: Internal Medicine

## 2021-03-12 ENCOUNTER — Telehealth: Payer: Self-pay | Admitting: Pharmacist

## 2021-03-12 NOTE — Progress Notes (Signed)
Chronic Care Management Pharmacy Assistant   Name: Desiree Andrews  MRN: [id number] DOB: [birth year]   Reason for Encounter: Disease State   Conditions to be addressed/monitored: HTN   Recent office visits:  None ID  Recent consult visits:  None ID  Hospital visits:  None in previous 6 months  Medications: Outpatient Encounter Medications as of 03/12/2021  Medication Sig   amLODipine (NORVASC) 2.5 MG tablet TAKE 1 TABLET(2.5 MG) BY MOUTH DAILY   amphetamine-dextroamphetamine (ADDERALL) 20 MG tablet Take 10 mg by mouth 2 (two) times daily.    atorvastatin (LIPITOR) 20 MG tablet Take 1 tablet (20 mg total) by mouth daily.   betamethasone dipropionate (DIPROLENE) 0.05 % ointment Apply topically 2 (two) times daily.   cetirizine (ZYRTEC) 10 MG tablet Take 10 mg by mouth as needed for allergies.    clobetasol cream (TEMOVATE) 8.09 % Apply 1 application topically 2 (two) times daily as needed.   fenofibrate (TRICOR) 145 MG tablet TAKE 1 TABLET(145 MG) BY MOUTH DAILY   hydrochlorothiazide (HYDRODIURIL) 25 MG tablet TAKE 1 TABLET(25 MG) BY MOUTH DAILY   ibuprofen (ADVIL) 600 MG tablet Take 600 mg by mouth every 8 (eight) hours as needed.   metoprolol tartrate (LOPRESSOR) 50 MG tablet TAKE 1 AND 1/2 TABLETS(75 MG) BY MOUTH TWICE DAILY   traZODone (DESYREL) 50 MG tablet Take 100 mg by mouth at bedtime as needed for sleep.    tretinoin (RETIN-A) 0.1 % cream Apply topically at bedtime.   No facility-administered encounter medications on file as of 03/12/2021.   Reviewed chart prior to disease state call. Spoke with patient regarding BP  Recent Office Vitals: BP Readings from Last 3 Encounters:  07/31/20 140/82  01/12/20 (!) 144/80  12/20/19 (!) 158/70   Pulse Readings from Last 3 Encounters:  07/31/20 84  01/12/20 79  12/20/19 72    Wt Readings from Last 3 Encounters:  07/31/20 172 lb (78 kg)  01/12/20 172 lb (78 kg)  12/20/19 172 lb 6.4 oz (78.2 kg)     Kidney  Function Lab Results  Component Value Date/Time   CREATININE 0.84 08/09/2020 08:30 AM   CREATININE 1.04 01/12/2020 02:37 PM   GFR 72.47 08/09/2020 08:30 AM   GFRNONAA >60 08/17/2019 10:27 AM   GFRAA >60 08/17/2019 10:27 AM    BMP Latest Ref Rng & Units 08/09/2020 01/12/2020 12/20/2019  Glucose 70 - 99 mg/dL 118(H) 131(H) 183(H)  BUN 6 - 23 mg/dL 19 20 17   Creatinine 0.40 - 1.20 mg/dL 0.84 1.04 0.98  Sodium 135 - 145 mEq/L 138 137 137  Potassium 3.5 - 5.1 mEq/L 3.5 3.9 4.1  Chloride 96 - 112 mEq/L 102 98 102  CO2 19 - 32 mEq/L 26 31 28   Calcium 8.4 - 10.5 mg/dL 9.8 9.9 9.7    Current antihypertensive regimen:  Metoprolol 50 mg 1 and 1/2 tab twice daily Amlodipine 2.5 mg 1 tab daily  How often are you checking your Blood Pressure? daily  Current home BP readings: Patient states that she does not have any reading does not write them down  What recent interventions/DTPs have been made by any provider to improve Blood Pressure control since last CPP Visit: None ID  Any recent hospitalizations or ED visits since last visit with CPP? No, patient has not had any recent hospital or   What diet changes have been made to improve Blood Pressure Control?  Patient states that she has not made any changes to her diet  What exercise is being done to improve your Blood Pressure Control?  Patient states that she walks daily getting 10,000 steps per day  Adherence Review: Is the patient currently on ACE/ARB medication? No Does the patient have >5 day gap between last estimated fill dates? No  Metoprolol 50 mg 1 and 1/2 tab twice daily 01/08/21 90 ds Amlodipine 2.5 mg 1 tab daily 12/23/20 90 ds  Star Rating Drugs: Atorvastatin 20 mg 12/24/20 90 ds  Ethelene Hal Clinical Pharmacist Assistant [phone]   Time spent:29

## 2021-03-19 ENCOUNTER — Other Ambulatory Visit: Payer: Self-pay | Admitting: Internal Medicine

## 2021-04-08 ENCOUNTER — Other Ambulatory Visit: Payer: Self-pay | Admitting: Internal Medicine

## 2021-04-21 ENCOUNTER — Other Ambulatory Visit: Payer: Self-pay

## 2021-04-21 NOTE — Progress Notes (Signed)
Subjective:    Patient ID: Desiree Andrews, female    DOB: 1955-09-04, 65 y.o.   MRN: [id number]  HPI The patient is here for follow up of their chronic medical problems, including htn, leg edema, prediabetes, dyslipidemia,  Drinking 1-2 glasses of wine at night.  She had covid and it was mild  She is working- getting 10000 steps most days.  She does not sleep well.  She does follow with psychiatry and was taking trazodone, but it did not help.    Medications and allergies reviewed with patient and updated if appropriate.  Patient Active Problem List   Diagnosis Date Noted   Bilateral leg edema 12/20/2019   Alcohol abuse 99991111   Lichen sclerosus [id number]   Prediabetes 02/17/2017   Obese 02/17/2017   Disturbance in sleep behavior 08/06/2010   PARESTHESIA, HANDS 05/20/2009   Vitamin D deficiency 12/18/2008   Osteopenia 12/18/2008   BREAST CANCER, HX OF 12/18/2008   Dyslipidemia 10/19/2007   Hepatitis C antibody test positive 10/19/2007   Essential hypertension 06/07/2007    Current Outpatient Medications on File Prior to Visit  Medication Sig Dispense Refill   amLODipine (NORVASC) 2.5 MG tablet TAKE 1 TABLET(2.5 MG) BY MOUTH DAILY 90 tablet 1   amphetamine-dextroamphetamine (ADDERALL) 20 MG tablet Take 10 mg by mouth 2 (two) times daily.   0   atorvastatin (LIPITOR) 20 MG tablet Take 1 tablet (20 mg total) by mouth daily. 90 tablet 3   betamethasone dipropionate (DIPROLENE) 0.05 % ointment Apply topically 2 (two) times daily. 30 g 0   cetirizine (ZYRTEC) 10 MG tablet Take 10 mg by mouth as needed for allergies.      clobetasol cream (TEMOVATE) [id number] % Apply 1 application topically 2 (two) times daily as needed. 30 g 2   fenofibrate (TRICOR) 145 MG tablet TAKE 1 TABLET(145 MG) BY MOUTH DAILY 90 tablet 1   hydrochlorothiazide (HYDRODIURIL) 25 MG tablet TAKE 1 TABLET(25 MG) BY MOUTH DAILY 90 tablet 0   ibuprofen (ADVIL) 600 MG tablet Take 600 mg by mouth every 8  (eight) hours as needed.     metoprolol tartrate (LOPRESSOR) 50 MG tablet TAKE 1 AND 1/2 TABLETS(75 MG) BY MOUTH TWICE DAILY 270 tablet 1   tretinoin (RETIN-A) 0.1 % cream Apply topically at bedtime. 45 g 6   No current facility-administered medications on file prior to visit.    Past Medical History:  Diagnosis Date   Allergy    Anxiety    Cancer ([place])    breast-left    Cardiopulmonary arrest ([place]) 1991   Postpartum,? Pulmonary thromboembolism   Depression    off cymbalta now   Hyperlipidemia    Hypertension     Past Surgical History:  Procedure Laterality Date   BREAST LUMPECTOMY  2003   snbx-Left, Dr.Rubin; Radiation & Chemotherapy X3   BREAST LUMPECTOMY WITH NEEDLE LOCALIZATION Right 11/30/2012   Procedure: BREAST LUMPECTOMY WITH NEEDLE LOCALIZATION;  Surgeon: Marcello Moores A. Cornett, MD;  Location: [facility];  Service: General;  Laterality: Right;  needle localization at [place] 8:00    CARDIOVASCULAR STRESS TEST  2005   Neg   COLONOSCOPY  2006   Normal   ORIF ANKLE FRACTURE Right 08/22/2019   Procedure: OPEN REDUCTION INTERNAL FIXATION (ORIF) RIGHT ANKLE FRACTURE;  Surgeon: Erle Crocker, MD;  Location: [facility];  Service: Orthopedics;  Laterality: Right;   WRIST SURGERY  2012   Left, Dr.Rowen  Social History   Socioeconomic History   Marital status: Divorced    Spouse name: Not on file   Number of children: Not on file   Years of education: Not on file   Highest education level: Not on file  Occupational History   Not on file  Tobacco Use   Smoking status: Former    Types: Cigarettes    Quit date: 08/24/1990    Years since quitting: 30.6   Smokeless tobacco: Never   Tobacco comments:    smoked [place], up to < 1 ppd  Vaping Use   Vaping Use: Never used  Substance and Sexual Activity   Alcohol use: Yes    Alcohol/week: 14.0 standard drinks    Types: 14 Glasses of wine per week    Comment: Socially    Drug use: No   Sexual activity: Not on file  Other Topics Concern   Not on file  Social History Narrative   Not on file   Social Determinants of Health   Financial Resource Strain: Not on file  Food Insecurity: Not on file  Transportation Needs: Not on file  Physical Activity: Not on file  Stress: Not on file  Social Connections: Not on file    Family History  Problem Relation Age of Onset   Diabetes Mother    Hypertension Mother    Hyperlipidemia Mother    Stroke Mother 12       02/2009   Heart attack Maternal Grandmother        in her 25s   Stomach cancer Maternal Grandfather 17   Colon cancer Neg Hx    Rectal cancer Neg Hx    Colon polyps Neg Hx    Esophageal cancer Neg Hx     Review of Systems  Constitutional:  Negative for chills and fever.  Respiratory:  Negative for cough, shortness of breath and wheezing.   Cardiovascular:  Positive for leg swelling (controlled). Negative for chest pain and palpitations.  Neurological:  Negative for dizziness, light-headedness and headaches.      Objective:   Vitals:   04/22/21 1537  BP: 126/72  Pulse: 71  Temp: 98.4 F (36.9 C)  SpO2: 96%   BP Readings from Last 3 Encounters:  04/22/21 126/72  07/31/20 140/82  01/12/20 (!) 144/80   Wt Readings from Last 3 Encounters:  04/22/21 176 lb 9.6 oz (80.1 kg)  07/31/20 172 lb (78 kg)  01/12/20 172 lb (78 kg)   Body mass index is 31.28 kg/m.   Depression screen [facility] 2/9 04/22/2021 08/26/2018  Decreased Interest 0 0  Down, Depressed, Hopeless 0 0  PHQ - 2 Score 0 0  Altered sleeping 2 -  Tired, decreased energy 1 -  Change in appetite 0 -  Feeling bad or failure about yourself  0 -  Trouble concentrating 0 -  Moving slowly or fidgety/restless 0 -  Suicidal thoughts 0 -  PHQ-9 Score 3 -  Difficult doing work/chores Somewhat difficult -    GAD 7 : Generalized Anxiety Score 04/22/2021  Nervous, Anxious, on Edge 0  Control/stop worrying 1  Worry too much -  different things 0  Trouble relaxing 1  Restless 0  Easily annoyed or irritable 0  Afraid - awful might happen 0  Total GAD 7 Score 2  Anxiety Difficulty Not difficult at all       Physical Exam    Constitutional: Appears well-developed and well-nourished. No distress.  HENT:  Head: Normocephalic and  atraumatic.  Neck: Neck supple. No tracheal deviation present. No thyromegaly present.  No cervical lymphadenopathy Cardiovascular: Normal rate, regular rhythm and normal heart sounds.   No murmur heard. No carotid bruit .  No edema Pulmonary/Chest: Effort normal and breath sounds normal. No respiratory distress. No has no wheezes. No rales.  Skin: Skin is warm and dry. Not diaphoretic.  Psychiatric: Normal mood and affect. Behavior is normal.      Assessment & Plan:     Screened for depression using the PHQ 9 scale.  No evidence of depression.   Screened for anxiety using GAD7 Scale.  No evidence of anxiety.   See Problem List for Assessment and Plan of chronic medical problems.    This visit occurred during the SARS-CoV-2 public health emergency.  Safety protocols were in place, including screening questions prior to the visit, additional usage of staff PPE, and extensive cleaning of exam room while observing appropriate contact time as indicated for disinfecting solutions.

## 2021-04-22 ENCOUNTER — Encounter: Payer: Self-pay | Admitting: Internal Medicine

## 2021-04-22 ENCOUNTER — Other Ambulatory Visit: Payer: Self-pay | Admitting: Internal Medicine

## 2021-04-22 ENCOUNTER — Ambulatory Visit (INDEPENDENT_AMBULATORY_CARE_PROVIDER_SITE_OTHER): Payer: Medicare Other | Admitting: Internal Medicine

## 2021-04-22 VITALS — BP 126/72 | HR 71 | Temp 98.4°F | Ht 63.0 in | Wt 176.6 lb

## 2021-04-22 DIAGNOSIS — Z1331 Encounter for screening for depression: Secondary | ICD-10-CM | POA: Diagnosis not present

## 2021-04-22 DIAGNOSIS — R600 Localized edema: Secondary | ICD-10-CM

## 2021-04-22 DIAGNOSIS — E785 Hyperlipidemia, unspecified: Secondary | ICD-10-CM

## 2021-04-22 DIAGNOSIS — R7303 Prediabetes: Secondary | ICD-10-CM

## 2021-04-22 DIAGNOSIS — I10 Essential (primary) hypertension: Secondary | ICD-10-CM | POA: Diagnosis not present

## 2021-04-22 DIAGNOSIS — Z1231 Encounter for screening mammogram for malignant neoplasm of breast: Secondary | ICD-10-CM

## 2021-04-22 DIAGNOSIS — E559 Vitamin D deficiency, unspecified: Secondary | ICD-10-CM

## 2021-04-22 DIAGNOSIS — F1010 Alcohol abuse, uncomplicated: Secondary | ICD-10-CM

## 2021-04-22 NOTE — Assessment & Plan Note (Signed)
Chronic Taking vitamin D daily Check vitamin D level  

## 2021-04-22 NOTE — Assessment & Plan Note (Signed)
Chronic Controlled Continue hydrochlorothiazide 25 mg daily 

## 2021-04-22 NOTE — Assessment & Plan Note (Signed)
Chronic Check lipid panel  Continue atorvastatin 20 mg daily Regular exercise and healthy diet encouraged  

## 2021-04-22 NOTE — Assessment & Plan Note (Signed)
Chronic Drinking 1-2 glasses of wine nightly Discussed that 1 glass of wine per night is the maximum recommended for women and ideally not drinking on a nightly basis

## 2021-04-22 NOTE — Assessment & Plan Note (Signed)
Chronic Blood pressure well controlled Continue Norvasc 2.5 mg daily, HCTZ 25 mg daily, metoprolol 75 mg twice daily CMP

## 2021-04-22 NOTE — Patient Instructions (Addendum)
    Blood work was ordered.      Medications changes include :   none     Please followup in 6 months  

## 2021-04-22 NOTE — Assessment & Plan Note (Signed)
Chronic Check a1c Low sugar / carb diet Stressed regular exercise-she is getting 10,000 steps a day when she works Stressed [employer] loss-discussed that she needs to cut out the candy and decrease her portions.  She also drinks 1-2 glasses of wine nightly and that is contributing to her weight and sugar

## 2021-04-30 ENCOUNTER — Other Ambulatory Visit (INDEPENDENT_AMBULATORY_CARE_PROVIDER_SITE_OTHER): Payer: Medicare Other

## 2021-04-30 DIAGNOSIS — E559 Vitamin D deficiency, unspecified: Secondary | ICD-10-CM | POA: Diagnosis not present

## 2021-04-30 DIAGNOSIS — E785 Hyperlipidemia, unspecified: Secondary | ICD-10-CM

## 2021-04-30 DIAGNOSIS — R7303 Prediabetes: Secondary | ICD-10-CM | POA: Diagnosis not present

## 2021-04-30 DIAGNOSIS — I10 Essential (primary) hypertension: Secondary | ICD-10-CM | POA: Diagnosis not present

## 2021-04-30 LAB — LIPID PANEL
Cholesterol: 265 mg/dL — ABNORMAL HIGH (ref 0–200)
HDL: 47.4 mg/dL (ref >39.00)
Total CHOL/HDL Ratio: 6
Triglycerides: 449 mg/dL — ABNORMAL HIGH (ref 0.0–149.0)

## 2021-04-30 LAB — COMPREHENSIVE METABOLIC PANEL
ALT: 22 U/L (ref 0–35)
AST: 23 U/L (ref 0–37)
Albumin: 4.4 g/dL (ref 3.5–5.2)
Alkaline Phosphatase: 54 U/L (ref 39–117)
BUN: 15 mg/dL (ref 6–23)
CO2: 23 mEq/L (ref 19–32)
Calcium: 9.6 mg/dL (ref 8.4–10.5)
Chloride: 103 mEq/L (ref 96–112)
Creatinine, Ser: 0.68 mg/dL (ref 0.40–1.20)
GFR: 90.36 mL/min (ref >60.00)
Glucose, Bld: 110 mg/dL — ABNORMAL HIGH (ref 70–99)
Potassium: 4.3 mEq/L (ref 3.5–5.1)
Sodium: 138 mEq/L (ref 135–145)
Total Bilirubin: 0.4 mg/dL (ref 0.2–1.2)
Total Protein: 7.1 g/dL (ref 6.0–8.3)

## 2021-04-30 LAB — HEMOGLOBIN A1C: Hgb A1c MFr Bld: 6.4 % (ref 4.6–6.5)

## 2021-04-30 LAB — VITAMIN D 25 HYDROXY (VIT D DEFICIENCY, FRACTURES): VITD: 26.81 ng/mL — ABNORMAL LOW (ref 30.00–100.00)

## 2021-04-30 LAB — LDL CHOLESTEROL, DIRECT: Direct LDL: 133 mg/dL

## 2021-05-22 ENCOUNTER — Other Ambulatory Visit: Payer: Self-pay | Admitting: Internal Medicine

## 2021-05-23 ENCOUNTER — Ambulatory Visit: Payer: Medicare Other | Admitting: Physician Assistant

## 2021-05-28 ENCOUNTER — Ambulatory Visit: Payer: Medicare Other | Admitting: Physician Assistant

## 2021-05-28 ENCOUNTER — Other Ambulatory Visit: Payer: Self-pay

## 2021-05-28 DIAGNOSIS — Z1283 Encounter for screening for malignant neoplasm of skin: Secondary | ICD-10-CM

## 2021-05-28 DIAGNOSIS — L900 Lichen sclerosus et atrophicus: Secondary | ICD-10-CM | POA: Diagnosis not present

## 2021-05-28 DIAGNOSIS — D239 Other benign neoplasm of skin, unspecified: Secondary | ICD-10-CM

## 2021-05-28 DIAGNOSIS — D225 Melanocytic nevi of trunk: Secondary | ICD-10-CM | POA: Diagnosis not present

## 2021-05-28 DIAGNOSIS — I879 Disorder of vein, unspecified: Secondary | ICD-10-CM

## 2021-05-28 DIAGNOSIS — L988 Other specified disorders of the skin and subcutaneous tissue: Secondary | ICD-10-CM | POA: Diagnosis not present

## 2021-05-28 DIAGNOSIS — D485 Neoplasm of uncertain behavior of skin: Secondary | ICD-10-CM

## 2021-05-28 HISTORY — DX: Other benign neoplasm of skin, unspecified: D23.9

## 2021-05-28 MED ORDER — TRETINOIN 0.1 % EX CREA: TOPICAL_CREAM | Freq: Every day | CUTANEOUS | 6 refills | Status: AC

## 2021-05-28 MED ORDER — FLUCONAZOLE 200 MG PO TABS: 200.0000 mg | ORAL_TABLET | Freq: Every day | ORAL | 0 refills | Status: DC

## 2021-05-28 MED ORDER — DOXYCYCLINE HYCLATE 100 MG PO TABS: 100.0000 mg | ORAL_TABLET | Freq: Two times a day (BID) | ORAL | 0 refills | Status: DC

## 2021-05-28 MED ORDER — TACROLIMUS 0.1 % EX OINT
TOPICAL_OINTMENT | Freq: Two times a day (BID) | CUTANEOUS | 2 refills | Status: AC
Start: 2021-05-28 — End: ?

## 2021-05-28 NOTE — Patient Instructions (Addendum)
  [place] vein specialist   Biopsy, Surgery (Curettage) & Surgery (Excision) Aftercare Instructions  1. Okay to remove bandage in 24 hours  2. Wash area with soap and water  3. Apply Vaseline to area twice daily until healed (Not Neosporin)  4. Okay to cover with a Band-Aid to decrease the chance of infection or prevent irritation from clothing; also it's okay to uncover lesion at home.  5. Suture instructions: return to our office in 7-10 or 10-14 days for a nurse visit for suture removal. Variable healing with sutures, if pain or itching occurs call our office. It's okay to shower or bathe 24 hours after sutures are given.  6. The following risks may occur after a biopsy, curettage or excision: bleeding, scarring, discoloration, recurrence, infection (redness, yellow drainage, pain or swelling).  7. For questions, concerns and results call our office at [place] before 4pm & Friday before 3pm. Biopsy results will be available in 1 week.

## 2021-05-29 ENCOUNTER — Encounter: Payer: Self-pay | Admitting: Physician Assistant

## 2021-05-30 ENCOUNTER — Ambulatory Visit
Admission: RE | Admit: 2021-05-30 | Discharge: 2021-05-30 | Disposition: A | Discharge status: Home / Self Care | Payer: Medicare Other | Source: Ambulatory Visit | Attending: Internal Medicine | Admitting: Internal Medicine

## 2021-05-30 ENCOUNTER — Other Ambulatory Visit: Payer: Self-pay

## 2021-05-30 DIAGNOSIS — Z1231 Encounter for screening mammogram for malignant neoplasm of breast: Secondary | ICD-10-CM | POA: Diagnosis not present

## 2021-06-02 ENCOUNTER — Telehealth: Payer: Self-pay | Admitting: *Deleted

## 2021-06-02 ENCOUNTER — Other Ambulatory Visit: Payer: Self-pay | Admitting: Internal Medicine

## 2021-06-02 LAB — ANAEROBIC AND AEROBIC CULTURE
MICRO NUMBER:: 12464244
MICRO NUMBER:: 12464245
SPECIMEN QUALITY:: ADEQUATE
SPECIMEN QUALITY:: ADEQUATE

## 2021-06-02 NOTE — Telephone Encounter (Signed)
-----   Message from [name], [place] sent at 06/02/2021 12:03 PM EDT ----- Check status. If not doing well Rx: Augmentin 500/125. 1 po bid with food #14 no rf.

## 2021-06-02 NOTE — Telephone Encounter (Signed)
Left message to call back for results

## 2021-06-03 NOTE — Progress Notes (Signed)
   Follow-Up Visit   Subjective  Desiree Andrews is a 65 y.o. female who presents for the following: Annual Exam (Here for annual skin exam. Concerns are under belly she gets red and itching/irritation. Also she has lichen sclerosis on her vagina.  Gyn gave her clobetasol and she has betamethasone Cream it helps sometimes but she is always itching. Periodontist said for [place] to check the inside of her lip area its a white patch on lower lip. ).   The following portions of the chart were reviewed this encounter and updated as appropriate:  Tobacco  Allergies  Meds  Problems  Med Hx  Surg Hx  Fam Hx      Objective  Well appearing patient in no apparent distress; mood and affect are within normal limits.  A full examination was performed including scalp, head, eyes, ears, nose, lips, neck, chest, axillae, abdomen, back, buttocks, bilateral upper extremities, bilateral lower extremities, hands, feet, fingers, toes, fingernails, and toenails. All findings within normal limits unless otherwise noted below.  Mid Back Hyperkeratotic scale with pink base      Labia Severe erythema with white scarring on the outer labia majora. Labia minora and clitoral hood are scarred in.     Head - Anterior (Face) Fine lines  Left Lower Leg - Anterior, Right Lower Leg - Anterior 2 large bulging areas on the shins when she stands up. They disappear when they are not gravity dependent.   Assessment & Plan  Neoplasm of uncertain behavior of skin Mid Back  Skin / nail biopsy Type of biopsy: tangential   Informed consent: discussed and consent obtained   Timeout: patient name, date of birth, surgical site, and procedure verified   Anesthesia: the lesion was anesthetized in a standard fashion   Anesthetic:  1% lidocaine w/ epinephrine 1-100,000 local infiltration Instrument used: flexible razor blade   Hemostasis achieved with: ferric subsulfate   Outcome: patient tolerated procedure well    Post-procedure details: wound care instructions given    Specimen 1 - Surgical pathology Differential Diagnosis: atypia  Check Margins: yes  Lichen sclerosus Labia  tacrolimus (PROTOPIC) 0.1 % ointment - Labia Apply topically 2 (two) times daily.  doxycycline (VIBRA-TABS) 100 MG tablet - Labia Take 1 tablet (100 mg total) by mouth 2 (two) times daily.  Anaerobic and Aerobic Culture - Labia  Age-related facial wrinkles Head - Anterior (Face)  tretinoin (RETIN-A) 0.1 % cream - Head - Anterior (Face) Apply topically at bedtime.  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     I, Xara Paulding, PA-C, have reviewed all documentation's for this visit.  The documentation on 06/03/21 for the exam, diagnosis, procedures and orders are all accurate and complete.

## 2021-06-03 NOTE — Telephone Encounter (Signed)
Patient is returning telephone call from yesterday about results.

## 2021-06-03 NOTE — Telephone Encounter (Signed)
Bacteria results to patient- patient states area is getting better and didn't need another antibiotic- told patient to call us if needed. Also informed patient that biopsy results are not back yet to call back next week.

## 2021-06-04 DIAGNOSIS — R2241 Localized swelling, mass and lump, right lower limb: Secondary | ICD-10-CM | POA: Diagnosis not present

## 2021-06-05 ENCOUNTER — Other Ambulatory Visit: Payer: Self-pay | Admitting: Internal Medicine

## 2021-06-05 DIAGNOSIS — R928 Other abnormal and inconclusive findings on diagnostic imaging of breast: Secondary | ICD-10-CM

## 2021-06-20 ENCOUNTER — Ambulatory Visit
Admission: RE | Admit: 2021-06-20 | Discharge: 2021-06-20 | Disposition: A | Discharge status: Home / Self Care | Payer: Medicare Other | Source: Ambulatory Visit | Attending: Internal Medicine | Admitting: Internal Medicine

## 2021-06-20 ENCOUNTER — Ambulatory Visit: Payer: Medicare Other

## 2021-06-20 DIAGNOSIS — R928 Other abnormal and inconclusive findings on diagnostic imaging of breast: Secondary | ICD-10-CM

## 2021-06-20 DIAGNOSIS — N6489 Other specified disorders of breast: Secondary | ICD-10-CM | POA: Diagnosis not present

## 2021-06-20 DIAGNOSIS — R922 Inconclusive mammogram: Secondary | ICD-10-CM | POA: Diagnosis not present

## 2021-06-30 ENCOUNTER — Telehealth: Payer: Self-pay | Admitting: Physician Assistant

## 2021-06-30 NOTE — Telephone Encounter (Signed)
She's going to call pharmacy for refill of doxy; wants a call back to let her know if it will be refilled.

## 2021-07-07 ENCOUNTER — Other Ambulatory Visit: Payer: Self-pay | Admitting: Physician Assistant

## 2021-07-07 DIAGNOSIS — L900 Lichen sclerosus et atrophicus: Secondary | ICD-10-CM

## 2021-07-08 DIAGNOSIS — R2241 Localized swelling, mass and lump, right lower limb: Secondary | ICD-10-CM | POA: Diagnosis not present

## 2021-07-24 DIAGNOSIS — K625 Hemorrhage of anus and rectum: Secondary | ICD-10-CM | POA: Diagnosis not present

## 2021-07-24 DIAGNOSIS — E669 Obesity, unspecified: Secondary | ICD-10-CM | POA: Diagnosis not present

## 2021-07-24 DIAGNOSIS — K5904 Chronic idiopathic constipation: Secondary | ICD-10-CM | POA: Diagnosis not present

## 2021-07-24 DIAGNOSIS — Z1211 Encounter for screening for malignant neoplasm of colon: Secondary | ICD-10-CM | POA: Diagnosis not present

## 2021-07-28 DIAGNOSIS — R2241 Localized swelling, mass and lump, right lower limb: Secondary | ICD-10-CM | POA: Diagnosis not present

## 2021-08-03 NOTE — Progress Notes (Signed)
Subjective:    Patient ID: Desiree Andrews, female    DOB: [birth year], 65 y.o.   MRN: [id number]   This visit occurred during the SARS-CoV-2 public health emergency.  Safety protocols were in place, including screening questions prior to the visit, additional usage of staff PPE, and extensive cleaning of exam room while observing appropriate contact time as indicated for disinfecting solutions.    HPI She is here for a physical exam.     Pain in left lower back.  It started yesterday.  She does not recall doing anything that may have caused it.  No radiation. No pain with being still.  Hurts with changes in position and turning over in bed.   She is taking ibuprofen.   Medications and allergies reviewed with patient and updated if appropriate.  Patient Active Problem List   Diagnosis Date Noted   Bilateral leg edema 12/20/2019   Alcohol abuse 12/75/1700   Lichen sclerosus 17/49/4496   Prediabetes 02/17/2017   Obese 02/17/2017   Disturbance in sleep behavior 08/06/2010   PARESTHESIA, HANDS 05/20/2009   Vitamin D deficiency 12/18/2008   Osteopenia 12/18/2008   BREAST CANCER, HX OF 12/18/2008   Dyslipidemia 10/19/2007   Hepatitis C antibody test positive 10/19/2007   Essential hypertension 06/07/2007    Current Outpatient Medications on File Prior to Visit  Medication Sig Dispense Refill   amLODipine (NORVASC) 2.5 MG tablet TAKE 1 TABLET(2.5 MG) BY MOUTH DAILY 90 tablet 1   amphetamine-dextroamphetamine (ADDERALL) 20 MG tablet Take 10 mg by mouth 2 (two) times daily.   0   atorvastatin (LIPITOR) 20 MG tablet Take 1 tablet (20 mg total) by mouth daily. 90 tablet 3   betamethasone dipropionate (DIPROLENE) 0.05 % ointment Apply topically 2 (two) times daily. 30 g 0   cetirizine (ZYRTEC) 10 MG tablet Take 10 mg by mouth as needed for allergies.      clobetasol cream (TEMOVATE) 7.59 % Apply 1 application topically 2 (two) times daily as needed. 30 g 2   fenofibrate  (TRICOR) 145 MG tablet TAKE 1 TABLET(145 MG) BY MOUTH DAILY 90 tablet 1   hydrochlorothiazide (HYDRODIURIL) 25 MG tablet TAKE 1 TABLET(25 MG) BY MOUTH DAILY 90 tablet 0   ibuprofen (ADVIL) 600 MG tablet Take 600 mg by mouth every 8 (eight) hours as needed.     metoprolol tartrate (LOPRESSOR) 50 MG tablet TAKE 1 AND 1/2 TABLETS(75 MG) BY MOUTH TWICE DAILY 270 tablet 1   tacrolimus (PROTOPIC) 0.1 % ointment Apply topically 2 (two) times daily. 100 g 2   tretinoin (RETIN-A) 0.1 % cream Apply topically at bedtime. 45 g 6   No current facility-administered medications on file prior to visit.    Past Medical History:  Diagnosis Date   Allergy    Anxiety    Cancer ([place])    breast-left    Cardiopulmonary arrest ([place]) 1991   Postpartum,? Pulmonary thromboembolism   Depression    off cymbalta now   Hyperlipidemia    Hypertension     Past Surgical History:  Procedure Laterality Date   BREAST LUMPECTOMY  2003   snbx-Left, Dr.Rubin; Radiation & Chemotherapy X3   BREAST LUMPECTOMY WITH NEEDLE LOCALIZATION Right 11/30/2012   Procedure: BREAST LUMPECTOMY WITH NEEDLE LOCALIZATION;  Surgeon: Marcello Moores A. Cornett, MD;  Location: [facility];  Service: General;  Laterality: Right;  needle localization at [facility] [place] 8:00    CARDIOVASCULAR STRESS TEST  2005   Neg  COLONOSCOPY  2006   Normal   ORIF ANKLE FRACTURE Right 08/22/2019   Procedure: OPEN REDUCTION INTERNAL FIXATION (ORIF) RIGHT ANKLE FRACTURE;  Surgeon: Erle Crocker, MD;  Location: [facility];  Service: Orthopedics;  Laterality: Right;   WRIST SURGERY  2012   Left, Dr.Rowen    Social History   Socioeconomic History   Marital status: Divorced    Spouse name: Not on file   Number of children: Not on file   Years of education: Not on file   Highest education level: Not on file  Occupational History   Not on file  Tobacco Use   Smoking status: Former    Types: Cigarettes    Quit date:  08/24/1990    Years since quitting: 30.9   Smokeless tobacco: Never   Tobacco comments:    smoked [place], up to < 1 ppd  Vaping Use   Vaping Use: Never used  Substance and Sexual Activity   Alcohol use: Yes    Alcohol/week: 14.0 standard drinks    Types: 14 Glasses of wine per week    Comment: Socially   Drug use: No   Sexual activity: Not on file  Other Topics Concern   Not on file  Social History Narrative   Not on file   Social Determinants of Health   Financial Resource Strain: Not on file  Food Insecurity: Not on file  Transportation Needs: Not on file  Physical Activity: Not on file  Stress: Not on file  Social Connections: Not on file    Family History  Problem Relation Age of Onset   Diabetes Mother    Hypertension Mother    Hyperlipidemia Mother    Stroke Mother 85       02/2009   Heart attack Maternal Grandmother        in her 32s   Stomach cancer Maternal Grandfather 59   Colon cancer Neg Hx    Rectal cancer Neg Hx    Colon polyps Neg Hx    Esophageal cancer Neg Hx     Review of Systems  Constitutional:  Negative for chills and fever.  Eyes:  Negative for visual disturbance.  Respiratory:  Negative for cough, shortness of breath and wheezing.   Cardiovascular:  Negative for chest pain, palpitations and leg swelling.  Gastrointestinal:  Positive for constipation. Negative for abdominal pain, blood in stool, diarrhea and nausea.       No gerd  Genitourinary:  Negative for dysuria.  Musculoskeletal:  Positive for back pain. Negative for arthralgias.  Skin:  Negative for rash.  Neurological:  Negative for light-headedness and headaches.  Psychiatric/Behavioral:  Positive for sleep disturbance. Negative for dysphoric mood. The patient is not nervous/anxious.       Objective:   Vitals:   08/04/21 1013  BP: 130/84  Pulse: 65  Temp: 98.1 F (36.7 C)  SpO2: 97%   Filed Weights   08/04/21 1013  Weight: 171 lb (77.6 kg)   Body mass index is  30.29 kg/m.  BP Readings from Last 3 Encounters:  08/04/21 130/84  04/22/21 126/72  07/31/20 140/82    Wt Readings from Last 3 Encounters:  08/04/21 171 lb (77.6 kg)  04/22/21 176 lb 9.6 oz (80.1 kg)  07/31/20 172 lb (78 kg)    Depression screen [facility] 2/9 04/22/2021 08/26/2018  Decreased Interest 0 0  Down, Depressed, Hopeless 0 0  PHQ - 2 Score 0 0  Altered sleeping 2 -  Tired, decreased energy 1 -  Change in appetite 0 -  Feeling bad or failure about yourself  0 -  Trouble concentrating 0 -  Moving slowly or fidgety/restless 0 -  Suicidal thoughts 0 -  PHQ-9 Score 3 -  Difficult doing work/chores Somewhat difficult -     GAD 7 : Generalized Anxiety Score 04/22/2021  Nervous, Anxious, on Edge 0  Control/stop worrying 1  Worry too much - different things 0  Trouble relaxing 1  Restless 0  Easily annoyed or irritable 0  Afraid - awful might happen 0  Total GAD 7 Score 2  Anxiety Difficulty Not difficult at all       Physical Exam Constitutional: She appears well-developed and well-nourished. No distress.  HENT:  Head: Normocephalic and atraumatic.  Right Ear: External ear normal. Normal ear canal and TM Left Ear: External ear normal.  Normal ear canal and TM Mouth/Throat: Oropharynx is clear and moist.  Eyes: Conjunctivae and EOM are normal.  Neck: Neck supple. No tracheal deviation present. No thyromegaly present.  No carotid bruit  Cardiovascular: Normal rate, regular rhythm and normal heart sounds.   No murmur heard.  No edema. Pulmonary/Chest: Effort normal and breath sounds normal. No respiratory distress. She has no wheezes. She has no rales.  Breast: deferred   Abdominal: Soft.  Obese.  She exhibits no distension. There is no tenderness.  Lymphadenopathy: She has no cervical adenopathy.  Skin: Skin is warm and dry. She is not diaphoretic.  Psychiatric: She has a normal mood and affect. Her behavior is normal.     Lab Results  Component Value Date    WBC 7.9 08/09/2020   HGB 14.1 08/09/2020   HCT 41.0 08/09/2020   PLT 334.0 08/09/2020   GLUCOSE 110 (H) 04/30/2021   CHOL 265 (H) 04/30/2021   TRIG (H) 04/30/2021    449.0 Triglyceride is over 400; calculations on Lipids are invalid.   HDL 47.40 04/30/2021   LDLDIRECT 133.0 04/30/2021   LDLCALC 184 (H) 10/26/2008   ALT 22 04/30/2021   AST 23 04/30/2021   NA 138 04/30/2021   K 4.3 04/30/2021   CL 103 04/30/2021   CREATININE 0.68 04/30/2021   BUN 15 04/30/2021   CO2 23 04/30/2021   TSH 1.37 08/09/2020   HGBA1C 6.4 04/30/2021         Assessment & Plan:   Physical exam: Screening blood work  ordered Exercise  none - walks a lot at work - 8000 step / day on average Weight  advised weight loss-gust decreasing portions Substance abuse drinks 2 glasses of wine a night-discussed she needs to cut down on this and should not be drinking on a nightly basis and should only limit herself to 1 glass   Reviewed recommended immunizations.   Health Maintenance  Topic Date Due   Pneumonia Vaccine 91+ Years old (2 - PPSV23 if available, else PCV20) 07/23/2020   COVID-19 Vaccine (4 - Booster for Pfizer series) 07/26/2020   TETANUS/TDAP  05/05/2021   INFLUENZA VACCINE  11/22/2021 (Originally 03/24/2021)   DEXA SCAN  01/03/2023   MAMMOGRAM  05/31/2023   COLONOSCOPY (Pts 45-57yrs Insurance coverage will need to be confirmed)  06/02/2023   Hepatitis C Screening  Completed   Zoster Vaccines- Shingrix  Completed   HPV VACCINES  Aged Out          See Problem List for Assessment and Plan of chronic medical problems.

## 2021-08-03 NOTE — Patient Instructions (Addendum)
Blood work was ordered.  Have it done at [facility]    Medications changes include stop the fenofibrate.  Start vascepa     Your prescription(s) have been submitted to your pharmacy. Please take as directed and contact our office if you believe you are having problem(s) with the medication(s).    Please followup in 6 months    Health Maintenance, Female Adopting a healthy lifestyle and getting preventive care are important in promoting health and wellness. Ask your health care provider about: The right schedule for you to have regular tests and exams. Things you can do on your own to prevent diseases and keep yourself healthy. What should I know about diet, weight, and exercise? Eat a healthy diet  Eat a diet that includes plenty of vegetables, fruits, low-fat dairy products, and lean protein. Do not eat a lot of foods that are high in solid fats, added sugars, or sodium. Maintain a healthy weight Body mass index (BMI) is used to identify weight problems. It estimates body fat based on height and weight. Your health care provider can help determine your BMI and help you achieve or maintain a healthy weight. Get regular exercise Get regular exercise. This is one of the most important things you can do for your health. Most adults should: Exercise for at least 150 minutes each week. The exercise should increase your heart rate and make you sweat (moderate-intensity exercise). Do strengthening exercises at least twice a week. This is in addition to the moderate-intensity exercise. Spend less time sitting. Even light physical activity can be beneficial. Watch cholesterol and blood lipids Have your blood tested for lipids and cholesterol at 67 years of age, then have this test every 5 years. Have your cholesterol levels checked more often if: Your lipid or cholesterol levels are high. You are older than 67 years of age. You are at high risk for heart disease. What should I know about cancer  screening? Depending on your health history and family history, you may need to have cancer screening at various ages. This may include screening for: Breast cancer. Cervical cancer. Colorectal cancer. Skin cancer. Lung cancer. What should I know about heart disease, diabetes, and high blood pressure? Blood pressure and heart disease High blood pressure causes heart disease and increases the risk of stroke. This is more likely to develop in people who have high blood pressure readings or are overweight. Have your blood pressure checked: Every 3-5 years if you are 40-106 years of age. Every year if you are 58 years old or older. Diabetes Have regular diabetes screenings. This checks your fasting blood sugar level. Have the screening done: Once every three years after age 88 if you are at a normal weight and have a low risk for diabetes. More often and at a younger age if you are overweight or have a high risk for diabetes. What should I know about preventing infection? Hepatitis B If you have a higher risk for hepatitis B, you should be screened for this virus. Talk with your health care provider to find out if you are at risk for hepatitis B infection. Hepatitis C Testing is recommended for: Everyone born from [birth year] through 1965. Anyone with known risk factors for hepatitis C. Sexually transmitted infections (STIs) Get screened for STIs, including gonorrhea and chlamydia, if: You are sexually active and are younger than 67 years of age. You are older than 67 years of age and your health care provider tells you that you are at  risk for this type of infection. Your sexual activity has changed since you were last screened, and you are at increased risk for chlamydia or gonorrhea. Ask your health care provider if you are at risk. Ask your health care provider about whether you are at high risk for HIV. Your health care provider may recommend a prescription medicine to help prevent HIV  infection. If you choose to take medicine to prevent HIV, you should first get tested for HIV. You should then be tested every 3 months for as long as you are taking the medicine. Pregnancy If you are about to stop having your period (premenopausal) and you may become pregnant, seek counseling before you get pregnant. Take 400 to 800 micrograms (mcg) of folic acid every day if you become pregnant. Ask for birth control (contraception) if you want to prevent pregnancy. Osteoporosis and menopause Osteoporosis is a disease in which the bones lose minerals and strength with aging. This can result in bone fractures. If you are 41 years old or older, or if you are at risk for osteoporosis and fractures, ask your health care provider if you should: Be screened for bone loss. Take a calcium or vitamin D supplement to lower your risk of fractures. Be given hormone replacement therapy (HRT) to treat symptoms of menopause. Follow these instructions at home: Alcohol use Do not drink alcohol if: Your health care provider tells you not to drink. You are pregnant, may be pregnant, or are planning to become pregnant. If you drink alcohol: Limit how much you have to: 0-1 drink a day. Know how much alcohol is in your drink. In the [place] one drink equals one 12 oz bottle of beer (355 mL), one 5 oz glass of wine (148 mL), or one 1 oz glass of hard liquor (44 mL). Lifestyle Do not use any products that contain nicotine or tobacco. These products include cigarettes, chewing tobacco, and vaping devices, such as e-cigarettes. If you need help quitting, ask your health care provider. Do not use street drugs. Do not share needles. Ask your health care provider for help if you need support or information about quitting drugs. General instructions Schedule regular health, dental, and eye exams. Stay current with your vaccines. Tell your health care provider if: You often feel depressed. You have ever been abused  or do not feel safe at home. Summary Adopting a healthy lifestyle and getting preventive care are important in promoting health and wellness. Follow your health care provider's instructions about healthy diet, exercising, and getting tested or screened for diseases. Follow your health care provider's instructions on monitoring your cholesterol and blood pressure. This information is not intended to replace advice given to you by your health care provider. Make sure you discuss any questions you have with your health care provider. Document Revised: 12/30/2020 Document Reviewed: 12/30/2020 Elsevier Patient Education  Desiree Andrews.

## 2021-08-04 ENCOUNTER — Ambulatory Visit (INDEPENDENT_AMBULATORY_CARE_PROVIDER_SITE_OTHER): Payer: Medicare Other | Admitting: Internal Medicine

## 2021-08-04 ENCOUNTER — Encounter: Payer: Self-pay | Admitting: Internal Medicine

## 2021-08-04 ENCOUNTER — Other Ambulatory Visit: Payer: Self-pay

## 2021-08-04 VITALS — BP 130/84 | HR 65 | Temp 98.1°F | Ht 63.0 in | Wt 171.0 lb

## 2021-08-04 DIAGNOSIS — Z0000 Encounter for general adult medical examination without abnormal findings: Secondary | ICD-10-CM | POA: Diagnosis not present

## 2021-08-04 DIAGNOSIS — R7303 Prediabetes: Secondary | ICD-10-CM

## 2021-08-04 DIAGNOSIS — E785 Hyperlipidemia, unspecified: Secondary | ICD-10-CM

## 2021-08-04 DIAGNOSIS — E6609 Other obesity due to excess calories: Secondary | ICD-10-CM

## 2021-08-04 DIAGNOSIS — Z23 Encounter for immunization: Secondary | ICD-10-CM

## 2021-08-04 DIAGNOSIS — G479 Sleep disorder, unspecified: Secondary | ICD-10-CM

## 2021-08-04 DIAGNOSIS — R768 Other specified abnormal immunological findings in serum: Secondary | ICD-10-CM

## 2021-08-04 DIAGNOSIS — F1010 Alcohol abuse, uncomplicated: Secondary | ICD-10-CM

## 2021-08-04 DIAGNOSIS — E66811 Other obesity due to excess calories: Secondary | ICD-10-CM

## 2021-08-04 DIAGNOSIS — Z6830 Body mass index (BMI) 30.0-30.9, adult: Secondary | ICD-10-CM

## 2021-08-04 DIAGNOSIS — I10 Essential (primary) hypertension: Secondary | ICD-10-CM

## 2021-08-04 DIAGNOSIS — M8589 Other specified disorders of bone density and structure, multiple sites: Secondary | ICD-10-CM

## 2021-08-04 MED ORDER — ICOSAPENT ETHYL 1 G PO CAPS: 2.0000 g | ORAL_CAPSULE | Freq: Two times a day (BID) | ORAL | 5 refills | Status: DC

## 2021-08-04 NOTE — Assessment & Plan Note (Signed)
Chronic Blood pressure well controlled CMP Continue amlodipine 2.5 mg daily, HCTZ 25 mg daily, metoprolol 75 mg twice daily

## 2021-08-04 NOTE — Assessment & Plan Note (Signed)
Chronic She is drinking 2 glasses of wine a night and we discussed that this is likely negatively affecting her sleep and that she should decrease this and stop drinking on a nightly basis, which may help her sleep She is interested in taking Ambien, but because of her chronic alcohol intake I have advised that I will not prescribe it

## 2021-08-04 NOTE — Assessment & Plan Note (Signed)
Chronic She does drink 2 glasses of wine at night Discussed increased risk of liver cirrhosis Discussed that this is likely affecting her sleep, which is not good Advised decreasing alcohol intake

## 2021-08-04 NOTE — Assessment & Plan Note (Signed)
Chronic Encouraged weight loss She is walking a good amount of steps at work on a daily basis-discussed decreasing caloric intake

## 2021-08-04 NOTE — Assessment & Plan Note (Signed)
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

## 2021-08-04 NOTE — Assessment & Plan Note (Signed)
Chronic DEXA up-to-date Encouraged regular exercise Advised calcium and vitamin D daily

## 2021-08-04 NOTE — Assessment & Plan Note (Addendum)
Chronic Regular exercise and healthy diet encouraged Check lipid panel  Continue atorvastatin 20 mg daily Discontinue fenofibrate since triglycerides were over 400 while taking it Start Vascepa 2 g twice daily Discussed causes of high triglycerides-discussed diet changes, exercise and weight loss.  Discussed alcohol cessation

## 2021-08-04 NOTE — Assessment & Plan Note (Signed)
Chronic Check a1c Low sugar / carb diet Stressed regular exercise  

## 2021-08-12 ENCOUNTER — Other Ambulatory Visit (INDEPENDENT_AMBULATORY_CARE_PROVIDER_SITE_OTHER): Payer: Medicare Other

## 2021-08-12 DIAGNOSIS — E785 Hyperlipidemia, unspecified: Secondary | ICD-10-CM | POA: Diagnosis not present

## 2021-08-12 DIAGNOSIS — R768 Other specified abnormal immunological findings in serum: Secondary | ICD-10-CM | POA: Diagnosis not present

## 2021-08-12 DIAGNOSIS — R7303 Prediabetes: Secondary | ICD-10-CM | POA: Diagnosis not present

## 2021-08-12 DIAGNOSIS — Z0000 Encounter for general adult medical examination without abnormal findings: Secondary | ICD-10-CM

## 2021-08-12 DIAGNOSIS — I10 Essential (primary) hypertension: Secondary | ICD-10-CM | POA: Diagnosis not present

## 2021-08-12 LAB — LIPID PANEL
Cholesterol: 253 mg/dL — ABNORMAL HIGH (ref 0–200)
HDL: 48 mg/dL (ref >39.00)
NonHDL: 204.76
Total CHOL/HDL Ratio: 5
Triglycerides: 350 mg/dL — ABNORMAL HIGH (ref 0.0–149.0)
VLDL: 70 mg/dL — ABNORMAL HIGH (ref 0.0–40.0)

## 2021-08-12 LAB — COMPREHENSIVE METABOLIC PANEL
ALT: 21 U/L (ref 0–35)
AST: 22 U/L (ref 0–37)
Albumin: 4.3 g/dL (ref 3.5–5.2)
Alkaline Phosphatase: 68 U/L (ref 39–117)
BUN: 13 mg/dL (ref 6–23)
CO2: 27 mEq/L (ref 19–32)
Calcium: 10.1 mg/dL (ref 8.4–10.5)
Chloride: 101 mEq/L (ref 96–112)
Creatinine, Ser: 0.68 mg/dL (ref 0.40–1.20)
GFR: 90.18 mL/min (ref >60.00)
Glucose, Bld: 111 mg/dL — ABNORMAL HIGH (ref 70–99)
Potassium: 4.2 mEq/L (ref 3.5–5.1)
Sodium: 138 mEq/L (ref 135–145)
Total Bilirubin: 0.6 mg/dL (ref 0.2–1.2)
Total Protein: 7.2 g/dL (ref 6.0–8.3)

## 2021-08-12 LAB — CBC WITH DIFFERENTIAL/PLATELET
Basophils Absolute: 0.1 10*3/uL (ref 0.0–0.1)
Basophils Relative: 1.1 % (ref 0.0–3.0)
Eosinophils Absolute: 0.4 10*3/uL (ref 0.0–0.7)
Eosinophils Relative: 6.4 % — ABNORMAL HIGH (ref 0.0–5.0)
HCT: 42.3 % (ref 36.0–46.0)
Hemoglobin: 14.4 g/dL (ref 12.0–15.0)
Lymphocytes Relative: 40.1 % (ref 12.0–46.0)
Lymphs Abs: 2.4 10*3/uL (ref 0.7–4.0)
MCHC: 34.1 g/dL (ref 30.0–36.0)
MCV: 91.3 fl (ref 78.0–100.0)
Monocytes Absolute: 0.6 10*3/uL (ref 0.1–1.0)
Monocytes Relative: 9.3 % (ref 3.0–12.0)
Neutro Abs: 2.6 10*3/uL (ref 1.4–7.7)
Neutrophils Relative %: 43.1 % (ref 43.0–77.0)
Platelets: 273 10*3/uL (ref 150.0–400.0)
RBC: 4.63 Mil/uL (ref 3.87–5.11)
RDW: 13.6 % (ref 11.5–15.5)
WBC: 6.1 10*3/uL (ref 4.0–10.5)

## 2021-08-12 LAB — TSH: TSH: 1.32 u[IU]/mL (ref 0.35–5.50)

## 2021-08-12 LAB — HEMOGLOBIN A1C: Hgb A1c MFr Bld: 6.3 % (ref 4.6–6.5)

## 2021-08-12 LAB — LDL CHOLESTEROL, DIRECT: Direct LDL: 159 mg/dL

## 2021-08-13 ENCOUNTER — Telehealth: Payer: Self-pay | Admitting: Internal Medicine

## 2021-08-13 NOTE — Telephone Encounter (Signed)
Patient states she received her lab results and has some concerns  Patient is requesting a call back to discuss her concerns

## 2021-08-14 LAB — HCV RNA,QUANTITATIVE REAL TIME PCR
HCV Quantitative Log: <1.18 Log IU/mL
HCV RNA, PCR, QN: <15 IU/mL

## 2021-08-14 LAB — HEPATITIS C ANTIBODY
Hepatitis C Ab: REACTIVE — AB
SIGNAL TO CUT-OFF: 7.59 — ABNORMAL HIGH (ref <1.00)

## 2021-08-19 NOTE — Telephone Encounter (Signed)
Sent mychart message

## 2021-08-31 ENCOUNTER — Other Ambulatory Visit: Payer: Self-pay | Admitting: Internal Medicine

## 2021-09-19 IMAGING — CR DG FOOT COMPLETE 3+V*R*
3 series · 3 of 3 positions shown · non-contrast
Comparison: None.

CLINICAL DATA: Fall.  Ankle pain and deformity.

EXAM:
RIGHT FOOT COMPLETE - 3+ VIEW

[x foot lat right]
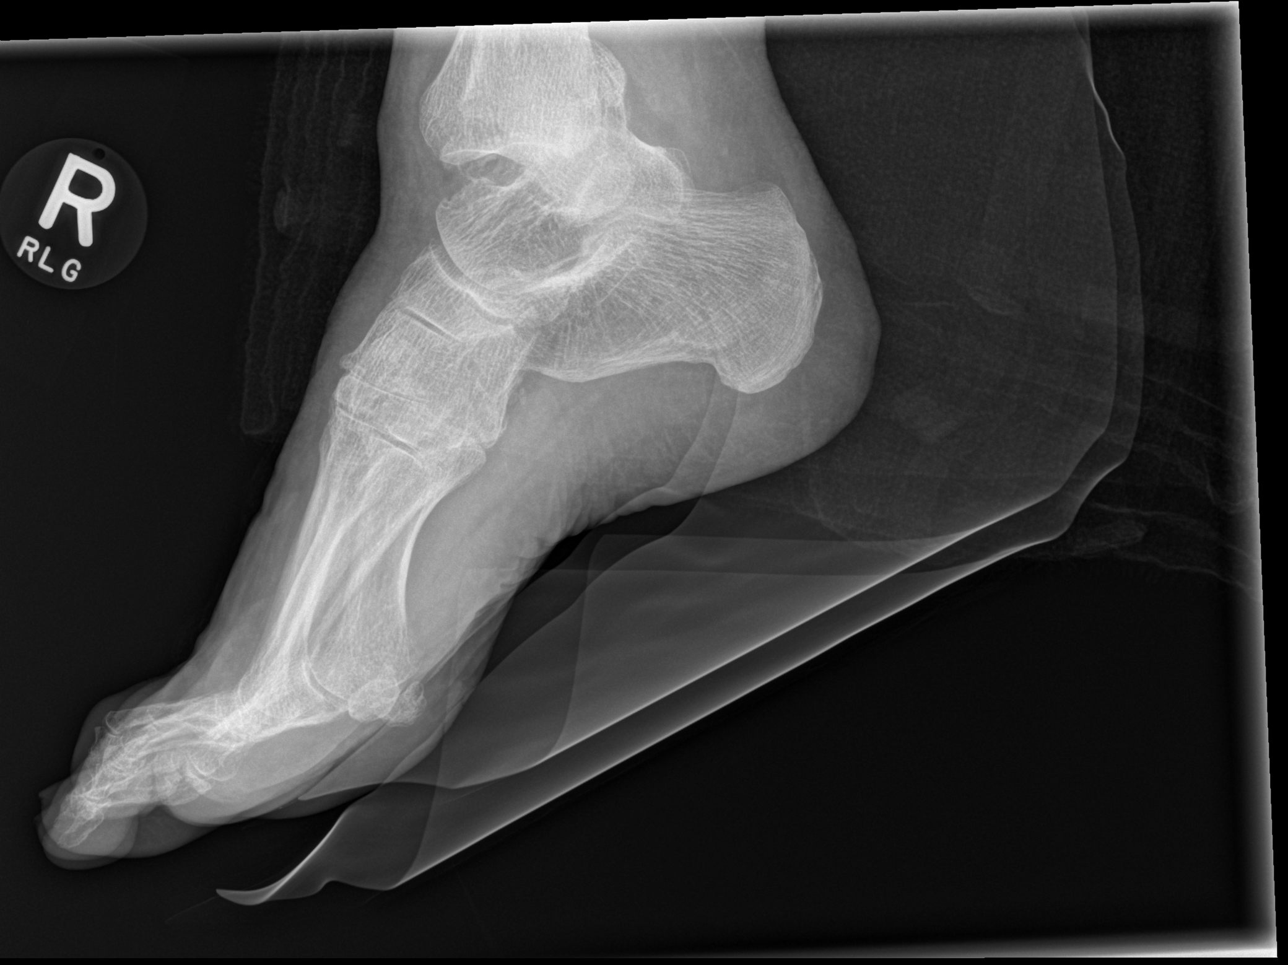

[x foot ap right]
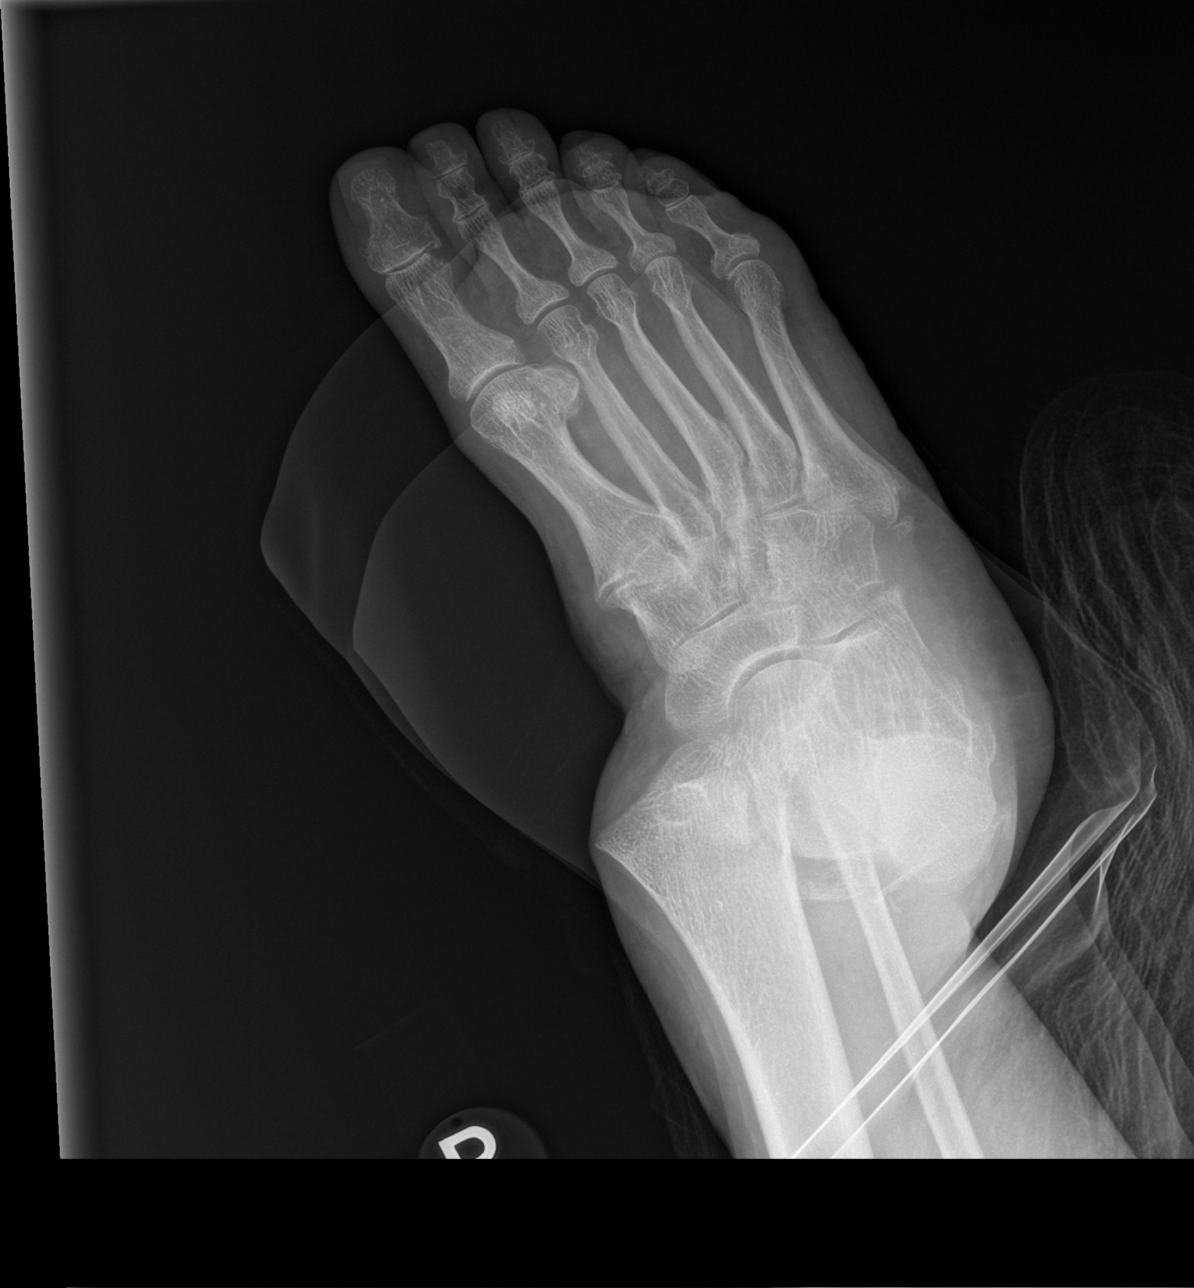

[x foot obl right]
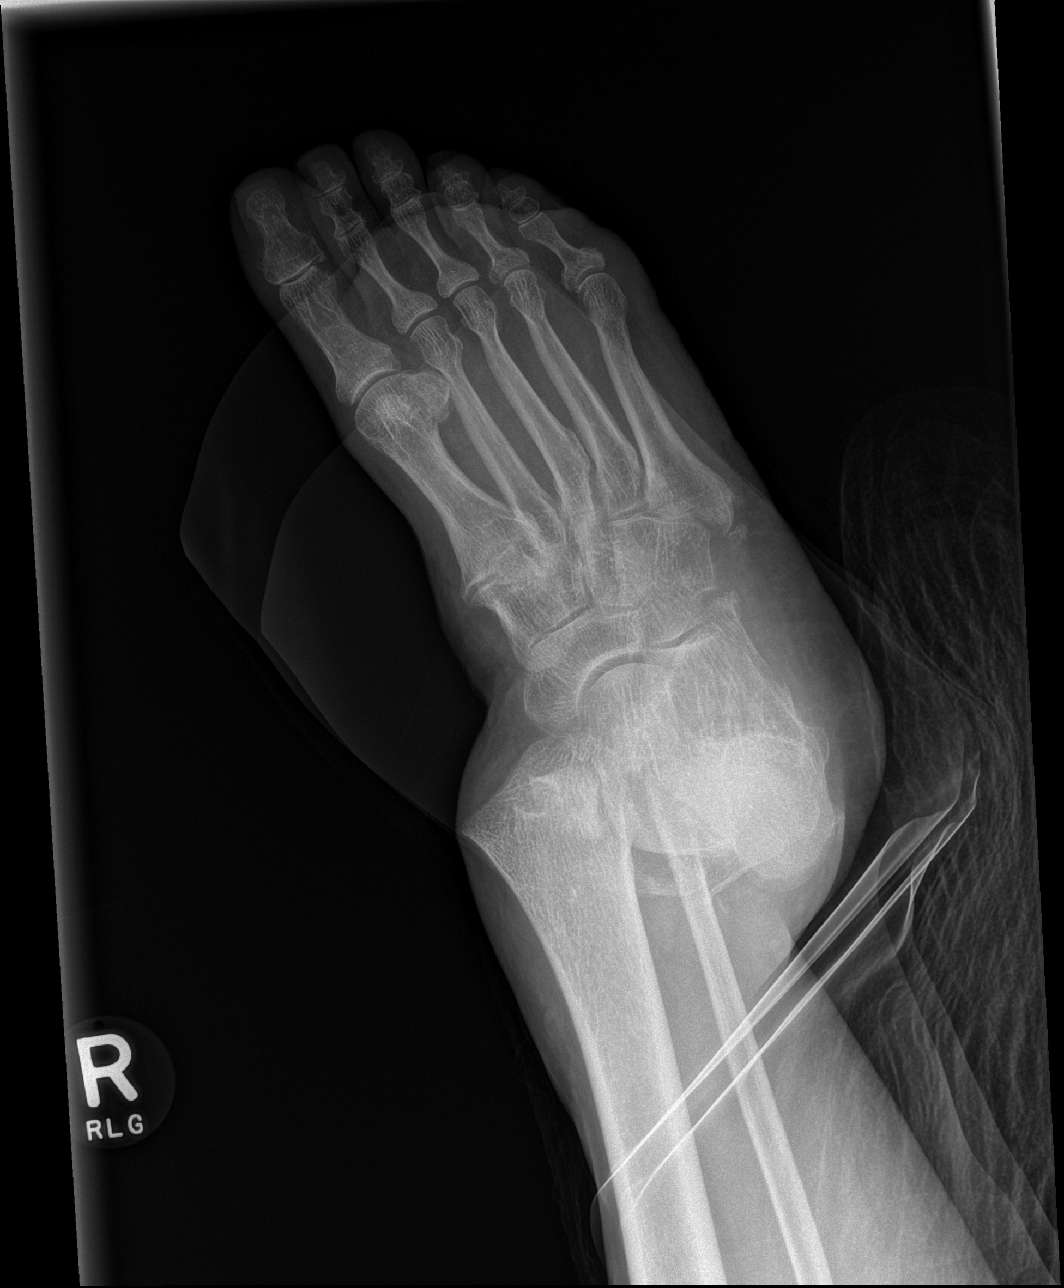

[3 of 3 positions shown; findings below may reference images not displayed]

FINDINGS: Trimalleolar fracture dislocation noted at the right ankle is seen
on ankle series. Bone fragment adjacent to the base of the 5th
metatarsal appears corticated and may be related to old injury. No
definite acute fracture within the foot.
IMPRESSION: Right ankle trimalleolar fracture dislocation.

No definite acute findings in the right foot.

## 2021-09-19 IMAGING — DX DG ANKLE COMPLETE 3+V*R*
4 series · 4 of 4 positions shown · non-contrast
Comparison: Earlier same day.

CLINICAL DATA: Post reduction right ankle fracture dislocation.

EXAM:
RIGHT ANKLE - COMPLETE 3+ VIEW

[ankle ap]
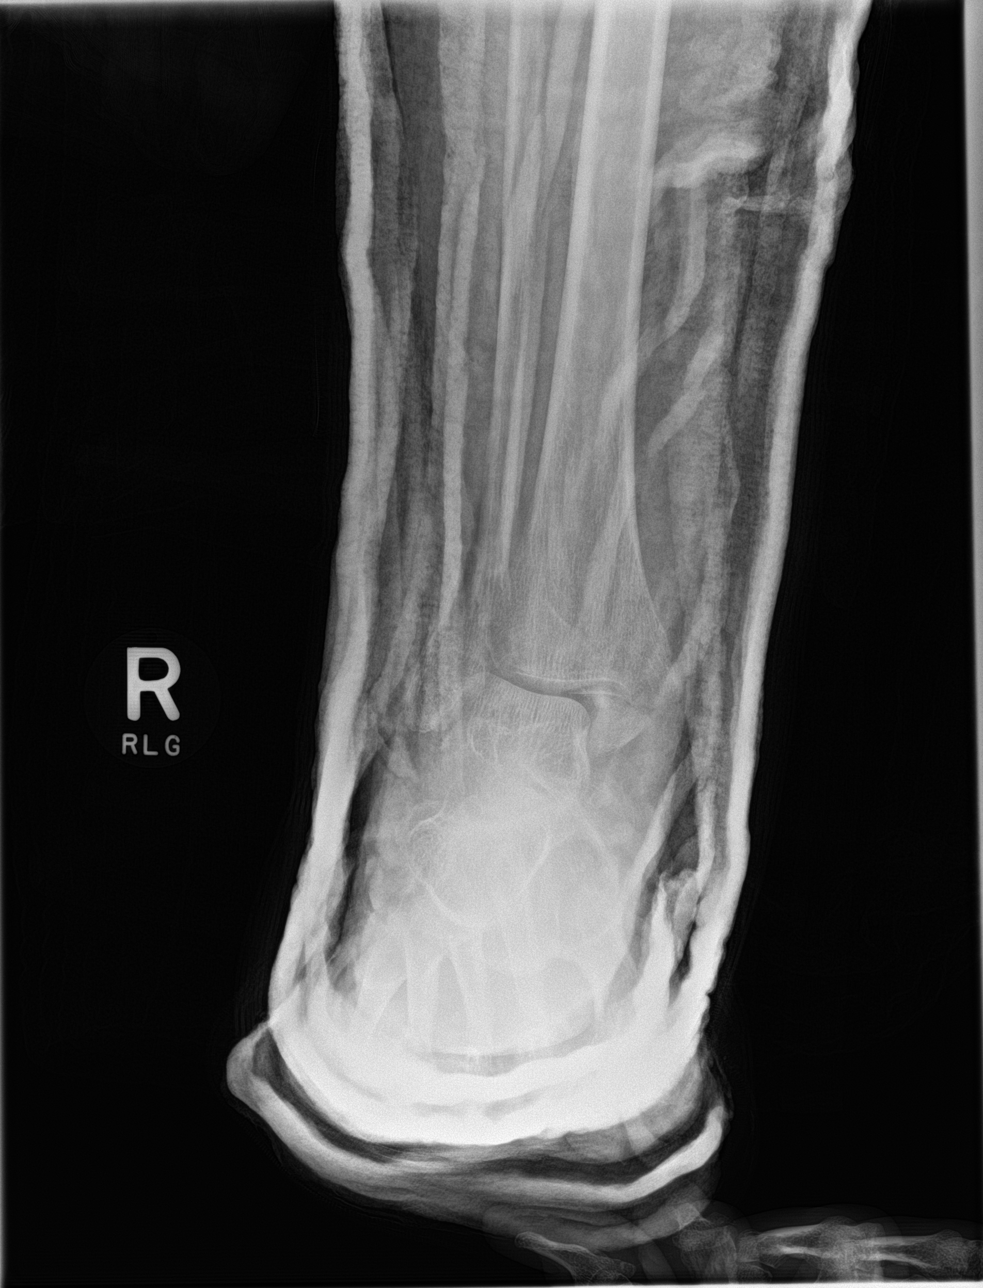

[ankle obl]
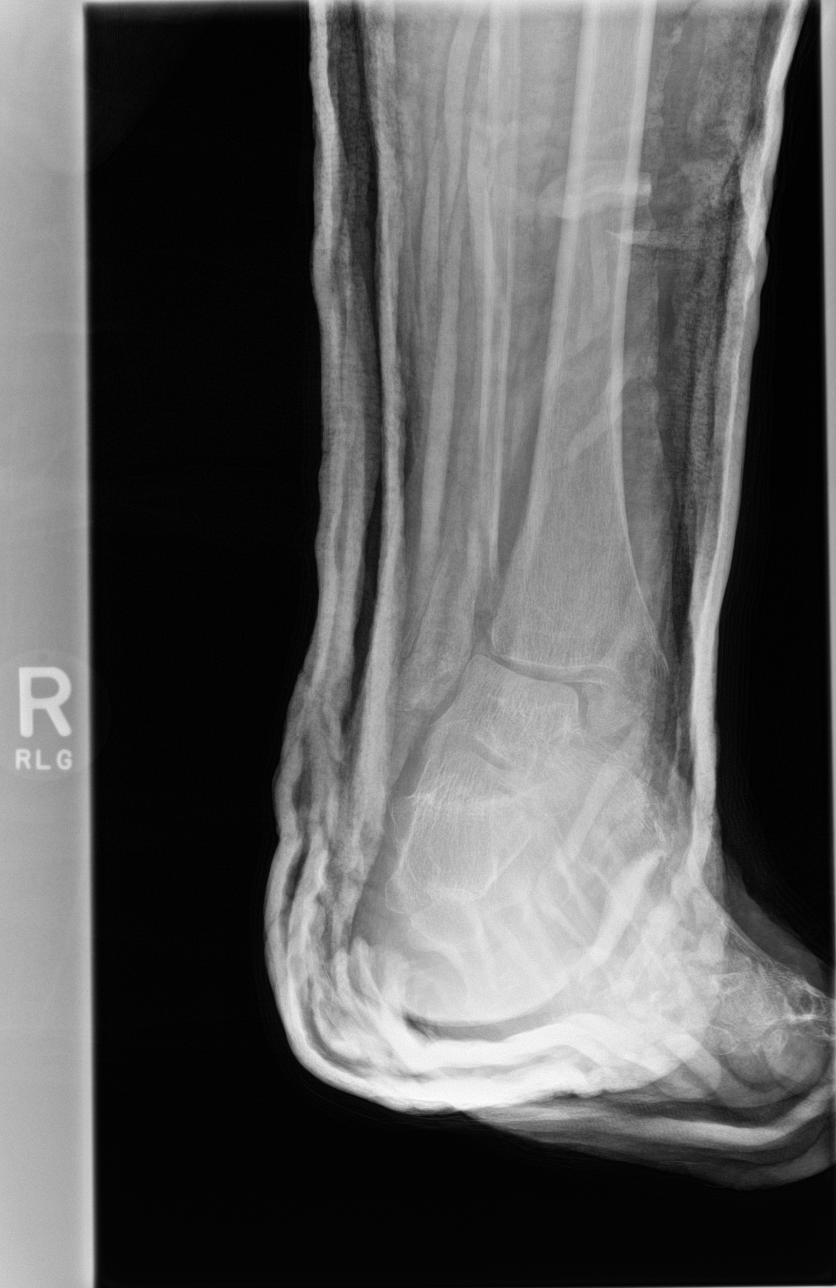

[ankle lat (1 of 2)]
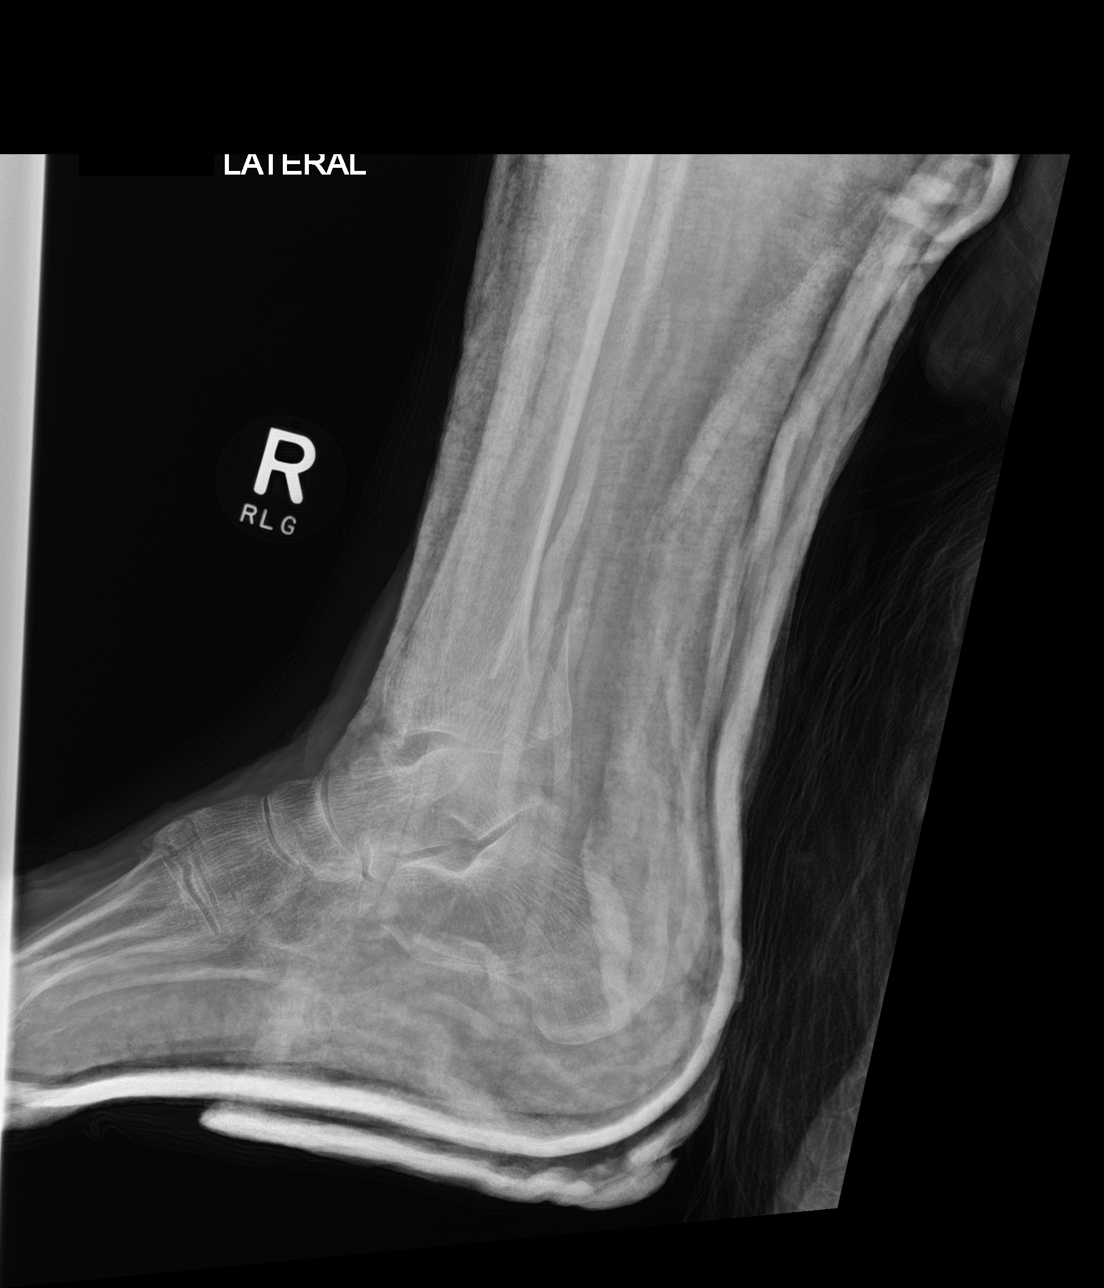

[ankle lat (2 of 2)]
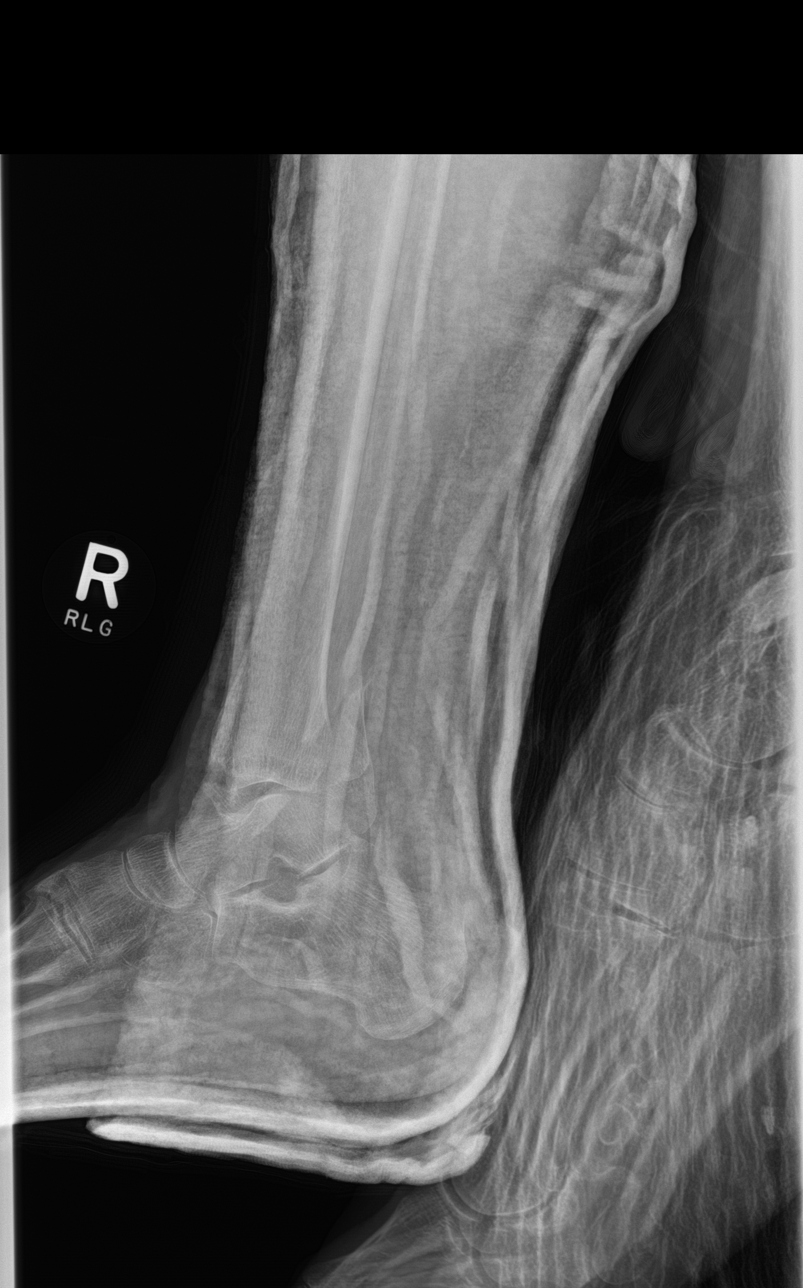

[4 of 4 positions shown; findings below may reference images not displayed]

FINDINGS: Overlying cast obscures evaluation of bony detail. There is mild
residual anteromedial subluxation of the tibia on the talus. Mild
persistent displacement of the distal fibular fracture and medial
malleolar fracture. Suggestion of minimally displaced posterior
malleolar fracture. Remainder the exam is unchanged
IMPRESSION: Mild residual displacement over patient's known distal fibular,
medial malleolar and posterior malleolar fractures. Mild residual
anteromedial subluxation of the tibia on the talus. Overlying cast.

## 2021-09-19 IMAGING — CR DG ANKLE COMPLETE 3+V*R*
3 series · 3 of 3 positions shown · non-contrast
Comparison: None.

CLINICAL DATA: Fall.  Pain and deformity

EXAM:
RIGHT ANKLE - COMPLETE 3+ VIEW

[x ankle lat right]
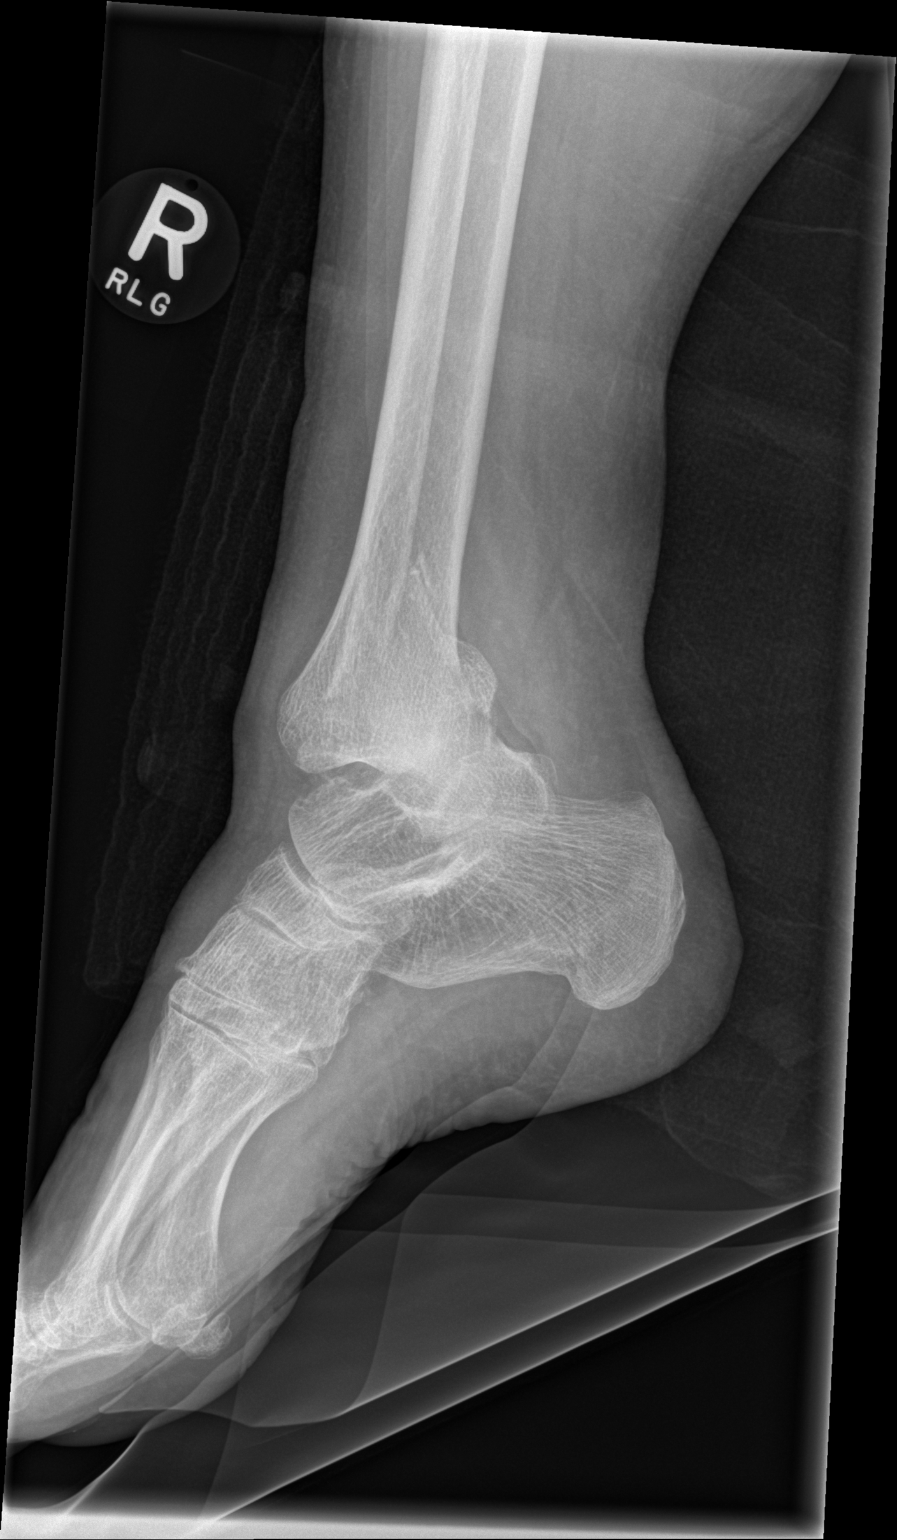

[x ankle ap right]
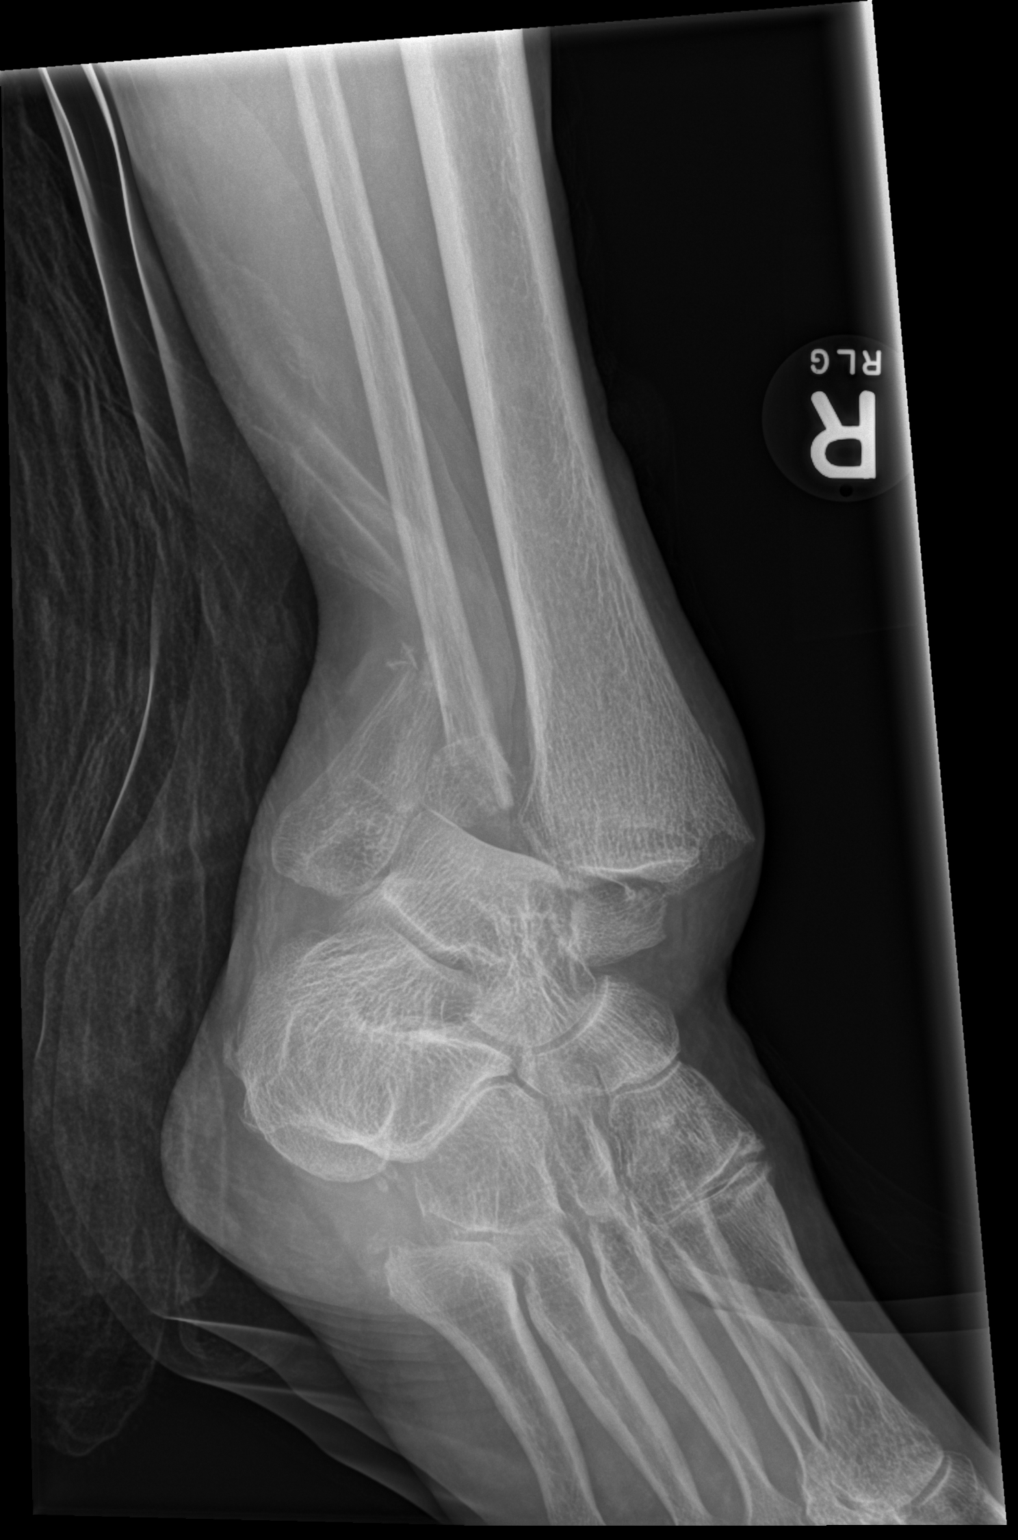

[x ankle obl right]
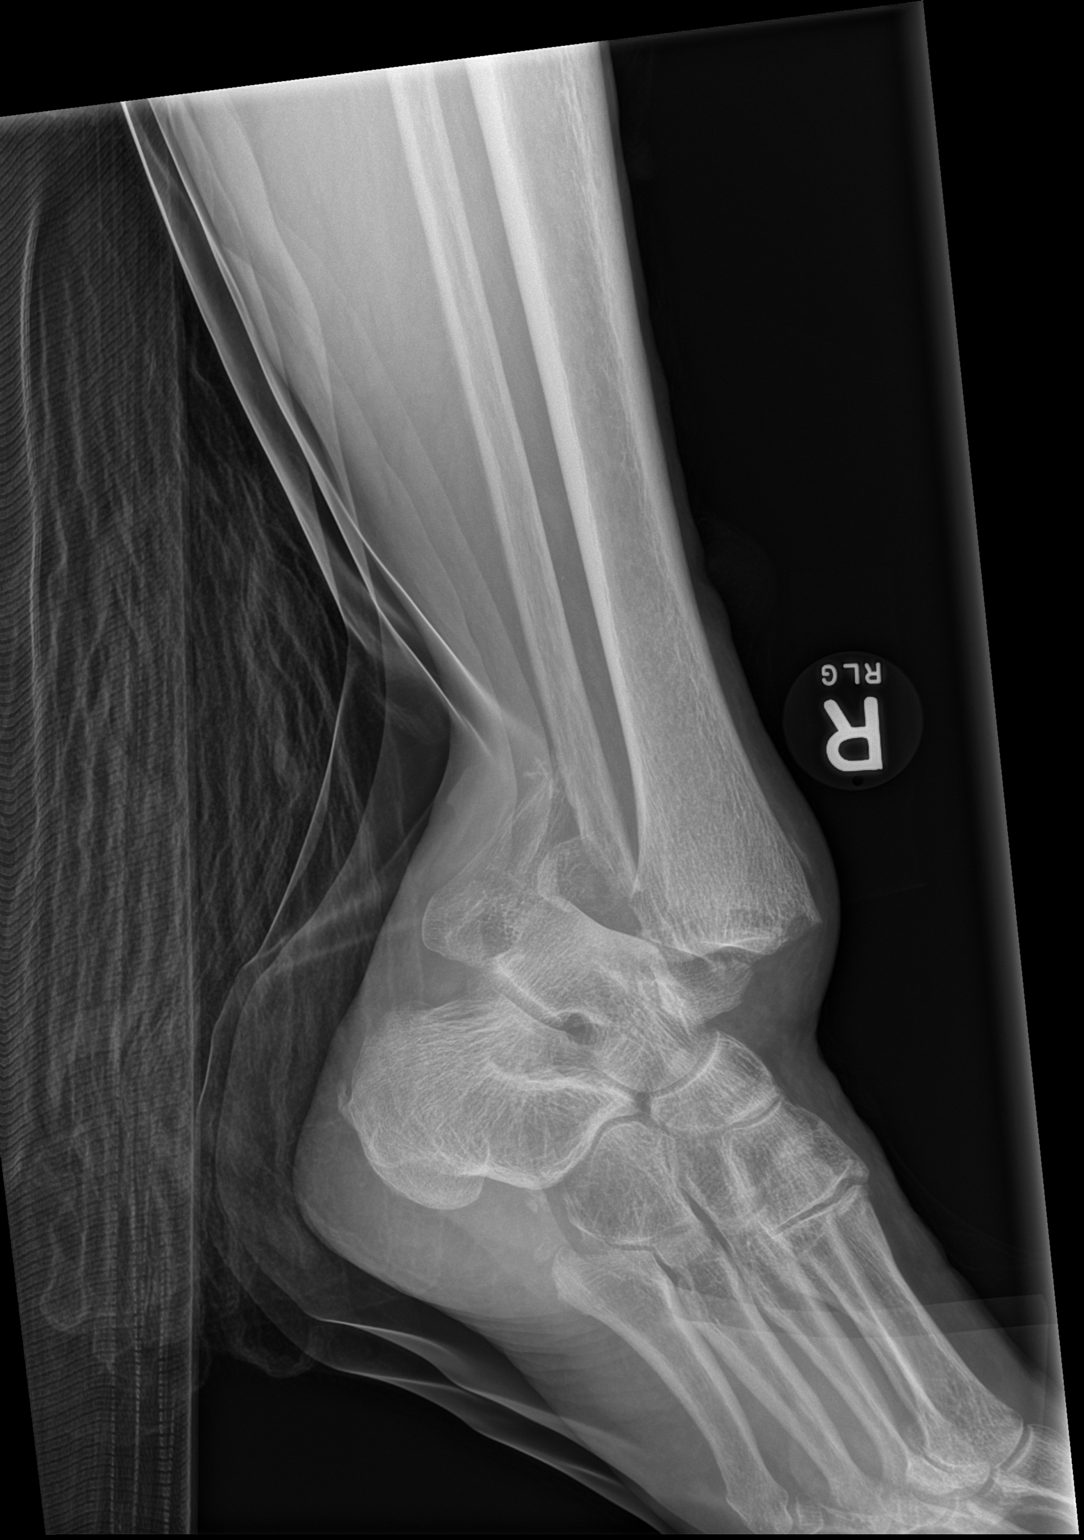

[3 of 3 positions shown; findings below may reference images not displayed]

FINDINGS: There is a comminuted displaced fracture dislocation at the right
ankle, likely trimalleolar fracture. The talus is dislocated
laterally relative to the tibia.
IMPRESSION: Trimalleolar fracture dislocation.

## 2021-09-28 ENCOUNTER — Other Ambulatory Visit: Payer: Self-pay | Admitting: Internal Medicine

## 2021-09-29 ENCOUNTER — Other Ambulatory Visit: Payer: Self-pay

## 2021-09-29 ENCOUNTER — Telehealth: Payer: Self-pay

## 2021-09-29 MED ORDER — ATORVASTATIN CALCIUM 40 MG PO TABS: ORAL_TABLET | ORAL | 2 refills | Status: DC

## 2021-09-29 NOTE — Telephone Encounter (Signed)
Pt was advised to increase atorvastatin (LIPITOR) 20 MG tablet to 40 mg daily but a refill was called in for 20 mg daily.  Please advise Pt [phone]

## 2021-09-29 NOTE — Telephone Encounter (Signed)
Left message for patient today and refill for 40 sent in today.

## 2021-10-06 ENCOUNTER — Other Ambulatory Visit: Payer: Self-pay | Admitting: Internal Medicine

## 2021-10-28 DIAGNOSIS — J0100 Acute maxillary sinusitis, unspecified: Secondary | ICD-10-CM | POA: Diagnosis not present

## 2021-11-06 ENCOUNTER — Telehealth: Payer: Self-pay

## 2021-11-06 NOTE — Progress Notes (Signed)
? ? ?Chronic Care Management ?Pharmacy Assistant  ? ?Name: Desiree Andrews  MRN: [id number] DOB: 1956/03/18 ? ?Desiree Andrews is an 66 y.o. year old female who presents for his follow-up CCM visit with the clinical pharmacist. ? ?Reason for Encounter: Disease State-General ?  ?Recent office visits:  ?08/04/21 [name], MD-PCP (Annual Exam) Blood work was ordered,Medications changes include stop the fenofibrate.  Start vascepa   ?  ?04/22/21 [name], MD-PCP (hypertension) Blood work was ordered.   ?Medications changes include : none ? ?Recent consult visits:  ?05/28/21 [name], PA-C-Dermatology (Annual exam) Anaerobic and Aerobic Culture, Med changes: tacrolimus (PROTOPIC) 0.1 % ointment ,doxycycline (VIBRA-TABS) 100 MG tablet, tretinoin (RETIN-A) 0.1 % cream  ? ?Hospital visits:  ?None in previous 6 months ? ?Medications: ?Outpatient Encounter Medications as of 11/06/2021  ?Medication Sig  ? amLODipine (NORVASC) 2.5 MG tablet TAKE 1 TABLET(2.5 MG) BY MOUTH DAILY  ? amphetamine-dextroamphetamine (ADDERALL) 20 MG tablet Take 10 mg by mouth 2 (two) times daily.   ? atorvastatin (LIPITOR) 40 MG tablet TAKE 1 TABLET(20 MG) BY MOUTH DAILY  ? betamethasone dipropionate (DIPROLENE) 0.05 % ointment Apply topically 2 (two) times daily.  ? cetirizine (ZYRTEC) 10 MG tablet Take 10 mg by mouth as needed for allergies.   ? clobetasol cream (TEMOVATE) 4.10 % Apply 1 application topically 2 (two) times daily as needed.  ? hydrochlorothiazide (HYDRODIURIL) 25 MG tablet TAKE 1 TABLET(25 MG) BY MOUTH DAILY  ? ibuprofen (ADVIL) 600 MG tablet Take 600 mg by mouth every 8 (eight) hours as needed.  ? icosapent Ethyl (VASCEPA) 1 g capsule Take 2 capsules (2 g total) by mouth 2 (two) times daily.  ? metoprolol tartrate (LOPRESSOR) 50 MG tablet TAKE 1 AND 1/2 TABLETS(75 MG) BY MOUTH TWICE DAILY  ? tacrolimus (PROTOPIC) 0.1 % ointment Apply topically 2 (two) times daily.  ? tretinoin (RETIN-A) 0.1 % cream Apply  topically at bedtime.  ? ?No facility-administered encounter medications on file as of 11/06/2021.  ? ? ?Recent Office Vitals: ?BP Readings from Last 3 Encounters:  ?08/04/21 130/84  ?04/22/21 126/72  ?07/31/20 140/82  ? ?Pulse Readings from Last 3 Encounters:  ?08/04/21 65  ?04/22/21 71  ?07/31/20 84  ?  ?Wt Readings from Last 3 Encounters:  ?08/04/21 171 lb (77.6 kg)  ?04/22/21 176 lb 9.6 oz (80.1 kg)  ?07/31/20 172 lb (78 kg)  ?  ? ?Kidney Function ?Lab Results  ?Component Value Date/Time  ? CREATININE 0.68 08/12/2021 07:57 AM  ? CREATININE 0.68 04/30/2021 07:50 AM  ? GFR 90.18 08/12/2021 07:57 AM  ? GFRNONAA >60 08/17/2019 10:27 AM  ? GFRAA >60 08/17/2019 10:27 AM  ? ? ?BMP Latest Ref Rng & Units 08/12/2021 04/30/2021 08/09/2020  ?Glucose 70 - 99 mg/dL 111(H) 110(H) 118(H)  ?BUN 6 - 23 mg/dL '13 15 19  '$ ?Creatinine 0.40 - 1.20 mg/dL 0.68 0.68 0.84  ?Sodium 135 - 145 mEq/L 138 138 138  ?Potassium 3.5 - 5.1 mEq/L 4.2 4.3 3.5  ?Chloride 96 - 112 mEq/L 101 103 102  ?CO2 19 - 32 mEq/L '27 23 26  '$ ?Calcium 8.4 - 10.5 mg/dL 10.1 9.6 9.8  ? ?Reviewed chart prior to disease state call. Spoke with patient regarding BP ? ?Current antihypertensive regimen:  ?Metoprolol tartrate 50 mg 1 and 1/2 tab BID ? ?How often are you checking your Blood Pressure?  Patient states that she does not take her blood pressure at home. She does have a cuff but states  that she just have not taken  ? ?Current home BP readings: Last bp reading was 134/80 on 08/04/21 at DR. Burns ? ?What recent interventions/DTPs have been made by any provider to improve Blood Pressure control since last CPP Visit: none noted ? ?Any recent hospitalizations or ED visits since last visit with CPP? No ? ?What diet changes have been made to improve Blood Pressure Control?  ?Patient stated that she is trying to eat better so that she can lose weight ? ?What exercise is being done to improve your Blood Pressure Control?  ?Patient states that she is active some days but could  do more ? ?Adherence Review: ?Is the patient currently on ACE/ARB medication? No ?Does the patient have >5 day gap between last estimated fill dates? No ? ? ? ?Care Gaps: ?Colonoscopy-06/01/18 31 ?Diabetic Foot Exam-NA ?Mammogram-05/30/21 ?Ophthalmology-NA ?Dexa Scan - 01/03/20 ?Annual Well Visit - 08/04/21 ?Micro albumin-NA ?Hemoglobin A1c- 08/12/21 ? ?Star Rating Drugs: ?Atorvastatin 40 mg-last fill 09/29/21 ? ?Ethelene Hal ?Clinical Pharmacist Assistant ?[phone]  ?

## 2021-12-25 ENCOUNTER — Other Ambulatory Visit: Payer: Self-pay | Admitting: Internal Medicine

## 2022-01-04 ENCOUNTER — Encounter: Payer: Self-pay | Admitting: Internal Medicine

## 2022-01-04 NOTE — Progress Notes (Signed)
? ? ?  Subjective:  ? ? Patient ID: Desiree Andrews, female    DOB: 1956/01/11, 66 y.o.   MRN: [id number] ? ? ? ? ? ?HPI ?Desiree Andrews is here for  ?Chief Complaint  ?Patient presents with  ? Medication discussion  ? ? ?Would like to discuss wegovy.   ? ? ?She does not tolerate the vascepa.   ? ? ?Calories 5 oz wine 125,  8 oz   200 ? ?She eats candy at work.  She does not want to cook at night - fixes something easy.   ? ? ?Medications and allergies reviewed with patient and updated if appropriate. ? ?Current Outpatient Medications on File Prior to Visit  ?Medication Sig Dispense Refill  ? amLODipine (NORVASC) 2.5 MG tablet TAKE 1 TABLET(2.5 MG) BY MOUTH DAILY 90 tablet 1  ? amphetamine-dextroamphetamine (ADDERALL) 20 MG tablet Take 10 mg by mouth 2 (two) times daily.   0  ? atorvastatin (LIPITOR) 40 MG tablet TAKE 1 TABLET(20 MG) BY MOUTH DAILY 90 tablet 2  ? betamethasone dipropionate (DIPROLENE) 0.05 % ointment Apply topically 2 (two) times daily. 30 g 0  ? cetirizine (ZYRTEC) 10 MG tablet Take 10 mg by mouth as needed for allergies.     ? clobetasol cream (TEMOVATE) 8.36 % Apply 1 application topically 2 (two) times daily as needed. 30 g 2  ? hydrochlorothiazide (HYDRODIURIL) 25 MG tablet TAKE 1 TABLET(25 MG) BY MOUTH DAILY 90 tablet 0  ? ibuprofen (ADVIL) 600 MG tablet Take 600 mg by mouth every 8 (eight) hours as needed.    ? metoprolol tartrate (LOPRESSOR) 50 MG tablet TAKE 1 AND 1/2 TABLETS(75 MG) BY MOUTH TWICE DAILY 270 tablet 1  ? tacrolimus (PROTOPIC) 0.1 % ointment Apply topically 2 (two) times daily. 100 g 2  ? tretinoin (RETIN-A) 0.1 % cream Apply topically at bedtime. 45 g 6  ? ?No current facility-administered medications on file prior to visit.  ? ? ?Review of Systems ? ?   ?Objective:  ? ?Vitals:  ? 01/05/22 0909  ?BP: 132/68  ?Pulse: 80  ?Temp: 97.9 ?F (36.6 ?C)  ?SpO2: 97%  ? ?BP Readings from Last 3 Encounters:  ?01/05/22 132/68  ?08/04/21 130/84  ?04/22/21 126/72  ? ?Wt Readings from Last 3  Encounters:  ?01/05/22 173 lb 6.4 oz (78.7 kg)  ?08/04/21 171 lb (77.6 kg)  ?04/22/21 176 lb 9.6 oz (80.1 kg)  ? ?Body mass index is 30.72 kg/m?. ? ?  ?Physical Exam ?Constitutional:   ?   General: She is not in acute distress. ?   Appearance: Normal appearance.  ?HENT:  ?   Head: Normocephalic.  ?Skin: ?   General: Skin is warm and dry.  ?Neurological:  ?   Mental Status: She is alert. Mental status is at baseline.  ?Psychiatric:     ?   Mood and Affect: Mood normal.  ? ?   ? ? ? ? ? ?Assessment & Plan:  ? ? ?See Problem List for Assessment and Plan of chronic medical problems.  ? ? ? ? ?

## 2022-01-05 ENCOUNTER — Ambulatory Visit: Payer: Medicare Other | Admitting: Internal Medicine

## 2022-01-05 VITALS — BP 132/68 | HR 80 | Temp 97.9°F | Ht 63.0 in | Wt 173.4 lb

## 2022-01-05 DIAGNOSIS — I10 Essential (primary) hypertension: Secondary | ICD-10-CM

## 2022-01-05 DIAGNOSIS — E6609 Other obesity due to excess calories: Secondary | ICD-10-CM | POA: Diagnosis not present

## 2022-01-05 DIAGNOSIS — Z6830 Body mass index (BMI) 30.0-30.9, adult: Secondary | ICD-10-CM | POA: Diagnosis not present

## 2022-01-05 DIAGNOSIS — E785 Hyperlipidemia, unspecified: Secondary | ICD-10-CM | POA: Diagnosis not present

## 2022-01-05 MED ORDER — SEMAGLUTIDE-WEIGHT MANAGEMENT 0.25 MG/0.5ML ~~LOC~~ SOAJ: 0.2500 mg | SUBCUTANEOUS | 0 refills | Status: DC

## 2022-01-05 NOTE — Patient Instructions (Signed)
? ? ? ?  Medications changes include :   wegovy 0.25 mg daily ? ? ?Your prescription(s) have been sent to your pharmacy.  ? ? ? ?

## 2022-01-05 NOTE — Assessment & Plan Note (Signed)
Chronic Blood pressure well controlled Continue amlodipine 2.5 mg daily, HCTZ 25 mg daily, metoprolol 75 mg twice daily 

## 2022-01-05 NOTE — Assessment & Plan Note (Signed)
Chronic ?She is here today because she wants to lose weight-she is more uncomfortable and low she needs to lose weight ?Her daughter is taking [place] and she was interested in trying not ?Discussed that it was likely this medication may not be covered by her insurance ?Encourage regular exercise.  She is very active at work often is getting more than 10,000 steps during the day. ?Gust the T4 is losing weight is decreased calorie intake ?Advised 1200 cal a day ?She currently drinks 2 glasses of wine nightly and discussed with her that it is almost impossible to lose weight when doing this.  Advised at least cutting down to 1 glass a night.  Discussed she will need to compensate with her food caloric intake ?Discussed lean protein, lots of vegetables.  Keep sugars and carbohydrates to a minimum ?She may try [place] if Mancel Parsons is not covered ?

## 2022-01-14 ENCOUNTER — Telehealth: Payer: Self-pay | Admitting: Internal Medicine

## 2022-01-14 NOTE — Telephone Encounter (Signed)
LVM for pt to rtn my call to schedule AWV-I with [facility]. Call back # [phone]

## 2022-01-26 DIAGNOSIS — Z86010 Personal history of colonic polyps: Secondary | ICD-10-CM | POA: Diagnosis not present

## 2022-01-26 DIAGNOSIS — K625 Hemorrhage of anus and rectum: Secondary | ICD-10-CM | POA: Diagnosis not present

## 2022-01-26 DIAGNOSIS — Z1211 Encounter for screening for malignant neoplasm of colon: Secondary | ICD-10-CM | POA: Diagnosis not present

## 2022-01-26 DIAGNOSIS — K5730 Diverticulosis of large intestine without perforation or abscess without bleeding: Secondary | ICD-10-CM | POA: Diagnosis not present

## 2022-01-26 LAB — HM COLONOSCOPY

## 2022-02-01 NOTE — Progress Notes (Unsigned)
      Subjective:    Patient ID: Desiree Andrews, female    DOB: Aug 28, 1955, 66 y.o.   MRN: [id number]     HPI Desiree Andrews is here for follow up of her chronic medical problems, including htn, hld, b/l leg edema,, prediabetes, vit d def     Medications and allergies reviewed with patient and updated if appropriate.  Current Outpatient Medications on File Prior to Visit  Medication Sig Dispense Refill   amLODipine (NORVASC) 2.5 MG tablet TAKE 1 TABLET(2.5 MG) BY MOUTH DAILY 90 tablet 1   amphetamine-dextroamphetamine (ADDERALL) 20 MG tablet Take 10 mg by mouth 2 (two) times daily.   0   atorvastatin (LIPITOR) 40 MG tablet TAKE 1 TABLET(20 MG) BY MOUTH DAILY 90 tablet 2   betamethasone dipropionate (DIPROLENE) 0.05 % ointment Apply topically 2 (two) times daily. 30 g 0   cetirizine (ZYRTEC) 10 MG tablet Take 10 mg by mouth as needed for allergies.      clobetasol cream (TEMOVATE) 1.32 % Apply 1 application topically 2 (two) times daily as needed. 30 g 2   hydrochlorothiazide (HYDRODIURIL) 25 MG tablet TAKE 1 TABLET(25 MG) BY MOUTH DAILY 90 tablet 0   ibuprofen (ADVIL) 600 MG tablet Take 600 mg by mouth every 8 (eight) hours as needed.     metoprolol tartrate (LOPRESSOR) 50 MG tablet TAKE 1 AND 1/2 TABLETS(75 MG) BY MOUTH TWICE DAILY 270 tablet 1   Semaglutide-Weight Management 0.25 MG/0.5ML SOAJ Inject 0.25 mg into the skin once a week for 28 days. 2 mL 0   tacrolimus (PROTOPIC) 0.1 % ointment Apply topically 2 (two) times daily. 100 g 2   tretinoin (RETIN-A) 0.1 % cream Apply topically at bedtime. 45 g 6   No current facility-administered medications on file prior to visit.     Review of Systems     Objective:  There were no vitals filed for this visit. BP Readings from Last 3 Encounters:  01/05/22 132/68  08/04/21 130/84  04/22/21 126/72   Wt Readings from Last 3 Encounters:  01/05/22 173 lb 6.4 oz (78.7 kg)  08/04/21 171 lb (77.6 kg)  04/22/21 176 lb 9.6 oz (80.1 kg)    There is no height or weight on file to calculate BMI.    Physical Exam     Lab Results  Component Value Date   WBC 6.1 08/12/2021   HGB 14.4 08/12/2021   HCT 42.3 08/12/2021   PLT 273.0 08/12/2021   GLUCOSE 111 (H) 08/12/2021   CHOL 253 (H) 08/12/2021   TRIG 350.0 (H) 08/12/2021   HDL 48.00 08/12/2021   LDLDIRECT 159.0 08/12/2021   LDLCALC 184 (H) 10/26/2008   ALT 21 08/12/2021   AST 22 08/12/2021   NA 138 08/12/2021   K 4.2 08/12/2021   CL 101 08/12/2021   CREATININE 0.68 08/12/2021   BUN 13 08/12/2021   CO2 27 08/12/2021   TSH 1.32 08/12/2021   HGBA1C 6.3 08/12/2021     Assessment & Plan:    See Problem List for Assessment and Plan of chronic medical problems.

## 2022-02-02 ENCOUNTER — Ambulatory Visit (INDEPENDENT_AMBULATORY_CARE_PROVIDER_SITE_OTHER): Payer: Medicare Other | Admitting: Internal Medicine

## 2022-02-02 ENCOUNTER — Encounter: Payer: Self-pay | Admitting: Internal Medicine

## 2022-02-02 VITALS — BP 130/74 | HR 71 | Temp 98.0°F | Ht 63.0 in | Wt 172.0 lb

## 2022-02-02 DIAGNOSIS — E785 Hyperlipidemia, unspecified: Secondary | ICD-10-CM

## 2022-02-02 DIAGNOSIS — R600 Localized edema: Secondary | ICD-10-CM

## 2022-02-02 DIAGNOSIS — I10 Essential (primary) hypertension: Secondary | ICD-10-CM | POA: Diagnosis not present

## 2022-02-02 DIAGNOSIS — R7303 Prediabetes: Secondary | ICD-10-CM

## 2022-02-02 DIAGNOSIS — Z6830 Body mass index (BMI) 30.0-30.9, adult: Secondary | ICD-10-CM

## 2022-02-02 DIAGNOSIS — E6609 Other obesity due to excess calories: Secondary | ICD-10-CM

## 2022-02-02 DIAGNOSIS — E559 Vitamin D deficiency, unspecified: Secondary | ICD-10-CM

## 2022-02-02 NOTE — Assessment & Plan Note (Signed)
Chronic Blood pressure well controlled CMP Continue amlodipine 2.5 mg daily, HCTZ 25 mg daily, metoprolol 75 mg twice daily

## 2022-02-02 NOTE — Assessment & Plan Note (Addendum)
Chronic Desiree Andrews is not covered by insurance She will try [facility] Continue walking/exercise-typically gets 10,000 steps a day Decrease wine intake and candy Decrease portions-concentrate on lean protein, vegetables Referred to healthy weight and wellness

## 2022-02-02 NOTE — Assessment & Plan Note (Signed)
Chronic Regular exercise and healthy diet encouraged Check lipid panel  Continue atorvastatin 40 mg daily 

## 2022-02-02 NOTE — Assessment & Plan Note (Signed)
Chronic Taking vitamin D daily Check vitamin D level  

## 2022-02-02 NOTE — Assessment & Plan Note (Signed)
Chronic Controlled Continue hydrochlorothiazide 25 mg daily Stressed regular exercise, weight loss

## 2022-02-02 NOTE — Patient Instructions (Addendum)
     Blood work was ordered.     Medications changes include :   none    A referral was ordered for healthy weight and wellness clinic.     Someone from that office will call you to schedule an appointment.    Return in about 6 months (around 08/04/2022) for follow up.

## 2022-02-02 NOTE — Assessment & Plan Note (Signed)
Chronic Check a1c Low sugar / carb diet Stressed regular exercise  

## 2022-02-03 ENCOUNTER — Other Ambulatory Visit (INDEPENDENT_AMBULATORY_CARE_PROVIDER_SITE_OTHER): Payer: Medicare Other

## 2022-02-03 DIAGNOSIS — L237 Allergic contact dermatitis due to plants, except food: Secondary | ICD-10-CM | POA: Diagnosis not present

## 2022-02-03 DIAGNOSIS — E559 Vitamin D deficiency, unspecified: Secondary | ICD-10-CM | POA: Diagnosis not present

## 2022-02-03 DIAGNOSIS — I10 Essential (primary) hypertension: Secondary | ICD-10-CM | POA: Diagnosis not present

## 2022-02-03 DIAGNOSIS — E785 Hyperlipidemia, unspecified: Secondary | ICD-10-CM

## 2022-02-03 DIAGNOSIS — R7303 Prediabetes: Secondary | ICD-10-CM | POA: Diagnosis not present

## 2022-02-03 LAB — COMPREHENSIVE METABOLIC PANEL WITH GFR
ALT: 20 U/L (ref 0–35)
AST: 23 U/L (ref 0–37)
Albumin: 4.4 g/dL (ref 3.5–5.2)
Alkaline Phosphatase: 82 U/L (ref 39–117)
BUN: 13 mg/dL (ref 6–23)
CO2: 21 meq/L (ref 19–32)
Calcium: 10.1 mg/dL (ref 8.4–10.5)
Chloride: 99 meq/L (ref 96–112)
Creatinine, Ser: 0.84 mg/dL (ref 0.40–1.20)
GFR: 71.71 mL/min
Glucose, Bld: 98 mg/dL (ref 70–99)
Potassium: 4.1 meq/L (ref 3.5–5.1)
Sodium: 134 meq/L — ABNORMAL LOW (ref 135–145)
Total Bilirubin: 0.7 mg/dL (ref 0.2–1.2)
Total Protein: 7.2 g/dL (ref 6.0–8.3)

## 2022-02-03 LAB — LDL CHOLESTEROL, DIRECT: Direct LDL: 123 mg/dL

## 2022-02-03 LAB — VITAMIN D 25 HYDROXY (VIT D DEFICIENCY, FRACTURES): VITD: 38.96 ng/mL (ref 30.00–100.00)

## 2022-02-03 LAB — LIPID PANEL
Cholesterol: 260 mg/dL — ABNORMAL HIGH (ref 0–200)
HDL: 49.3 mg/dL (ref >39.00)
Total CHOL/HDL Ratio: 5
Triglycerides: 614 mg/dL — ABNORMAL HIGH (ref 0.0–149.0)

## 2022-02-03 LAB — HEMOGLOBIN A1C: Hgb A1c MFr Bld: 6 % (ref 4.6–6.5)

## 2022-02-11 ENCOUNTER — Encounter: Payer: Self-pay | Admitting: Family Medicine

## 2022-02-11 ENCOUNTER — Ambulatory Visit (INDEPENDENT_AMBULATORY_CARE_PROVIDER_SITE_OTHER): Payer: Medicare Other | Admitting: Family Medicine

## 2022-02-11 VITALS — BP 130/82 | HR 75 | Temp 97.5°F | Ht 63.0 in | Wt 173.0 lb

## 2022-02-11 DIAGNOSIS — L299 Pruritus, unspecified: Secondary | ICD-10-CM | POA: Diagnosis not present

## 2022-02-11 DIAGNOSIS — L237 Allergic contact dermatitis due to plants, except food: Secondary | ICD-10-CM

## 2022-02-11 MED ORDER — TRIAMCINOLONE ACETONIDE 0.1 % EX CREA: 1.0000 | TOPICAL_CREAM | Freq: Two times a day (BID) | CUTANEOUS | 0 refills | Status: AC

## 2022-02-11 NOTE — Progress Notes (Signed)
Subjective:     Patient ID: Desiree Andrews, female    DOB: Oct 18, 1955, 66 y.o.   MRN: [id number]  Chief Complaint  Patient presents with   Poison Desiree Andrews    [facility] and they gave her prednisone. States that it seems like it is spreading.    HPI Patient is in today for poison ivy dermatitis.  Is currently on oral steroids, started them after going to urgent care.   No new symptoms.   No fever, chills, oropharyngeal edema, cough, wheezing, shortness of breath, N/V/D.   She plans to call and schedule with gynecologist for hx of lichen sclerosis of vulva. She has clobetasol at home for this.     There are no preventive care reminders to display for this patient.   Past Medical History:  Diagnosis Date   Allergy    Anxiety    Cancer ([place])    breast-left    Cardiopulmonary arrest ([place]) 1991   Postpartum,? Pulmonary thromboembolism   Depression    off cymbalta now   Hyperlipidemia    Hypertension     Past Surgical History:  Procedure Laterality Date   BREAST LUMPECTOMY  2003   snbx-Left, Dr.Rubin; Radiation & Chemotherapy X3   BREAST LUMPECTOMY WITH NEEDLE LOCALIZATION Right 11/30/2012   Procedure: BREAST LUMPECTOMY WITH NEEDLE LOCALIZATION;  Surgeon: Marcello Moores A. Cornett, MD;  Location: [facility];  Service: General;  Laterality: Right;  needle localization at [place] 8:00    CARDIOVASCULAR STRESS TEST  2005   Neg   COLONOSCOPY  2006   Normal   ORIF ANKLE FRACTURE Right 08/22/2019   Procedure: OPEN REDUCTION INTERNAL FIXATION (ORIF) RIGHT ANKLE FRACTURE;  Surgeon: Erle Crocker, MD;  Location: [facility];  Service: Orthopedics;  Laterality: Right;   WRIST SURGERY  2012   Left, Dr.Rowen    Family History  Problem Relation Age of Onset   Diabetes Mother    Hypertension Mother    Hyperlipidemia Mother    Stroke Mother 29       02/2009   Heart attack Maternal Grandmother        in  her 73s   Stomach cancer Maternal Grandfather 2   Colon cancer Neg Hx    Rectal cancer Neg Hx    Colon polyps Neg Hx    Esophageal cancer Neg Hx     Social History   Socioeconomic History   Marital status: Divorced    Spouse name: Not on file   Number of children: Not on file   Years of education: Not on file   Highest education level: Not on file  Occupational History   Not on file  Tobacco Use   Smoking status: Former    Types: Cigarettes    Quit date: 08/24/1990    Years since quitting: 31.4   Smokeless tobacco: Never   Tobacco comments:    smoked [place], up to < 1 ppd  Vaping Use   Vaping Use: Never used  Substance and Sexual Activity   Alcohol use: Yes    Alcohol/week: 14.0 standard drinks of alcohol    Types: 14 Glasses of wine per week    Comment: Socially   Drug use: No   Sexual activity: Not on file  Other Topics Concern   Not on file  Social History Narrative   Not on file   Social Determinants of Health   Financial Resource Strain:  Low Risk  (02/23/2020)   Overall Financial Resource Strain (CARDIA)    Difficulty of Paying Living Expenses: Not very hard  Food Insecurity: Not on file  Transportation Needs: Not on file  Physical Activity: Not on file  Stress: Not on file  Social Connections: Not on file  Intimate Partner Violence: Not on file    Outpatient Medications Prior to Visit  Medication Sig Dispense Refill   amLODipine (NORVASC) 2.5 MG tablet TAKE 1 TABLET(2.5 MG) BY MOUTH DAILY 90 tablet 1   amphetamine-dextroamphetamine (ADDERALL) 20 MG tablet Take 10 mg by mouth 2 (two) times daily.   0   atorvastatin (LIPITOR) 40 MG tablet TAKE 1 TABLET(20 MG) BY MOUTH DAILY 90 tablet 2   betamethasone dipropionate (DIPROLENE) 0.05 % ointment Apply topically 2 (two) times daily. 30 g 0   cetirizine (ZYRTEC) 10 MG tablet Take 10 mg by mouth as needed for allergies.      clobetasol cream (TEMOVATE) 8.65 % Apply 1 application topically 2 (two) times daily  as needed. 30 g 2   hydrochlorothiazide (HYDRODIURIL) 25 MG tablet TAKE 1 TABLET(25 MG) BY MOUTH DAILY 90 tablet 0   ibuprofen (ADVIL) 600 MG tablet Take 600 mg by mouth every 8 (eight) hours as needed.     metoprolol tartrate (LOPRESSOR) 50 MG tablet TAKE 1 AND 1/2 TABLETS(75 MG) BY MOUTH TWICE DAILY 270 tablet 1   predniSONE (DELTASONE) 10 MG tablet Take by mouth.     tacrolimus (PROTOPIC) 0.1 % ointment Apply topically 2 (two) times daily. 100 g 2   tretinoin (RETIN-A) 0.1 % cream Apply topically at bedtime. 45 g 6   No facility-administered medications prior to visit.    Allergies  Allergen Reactions   Codeine     Other reaction(s): Unknown   Pravastatin     Sleep disturbance    ROS     Objective:    Physical Exam Constitutional:      General: She is not in acute distress.    Appearance: She is not ill-appearing.  HENT:     Mouth/Throat:     Pharynx: Oropharynx is clear.  Cardiovascular:     Rate and Rhythm: Normal rate.  Pulmonary:     Effort: Pulmonary effort is normal.  Skin:    General: Skin is warm and dry.     Comments: Raised patches of erythema on right forearm with some dryness and scaling   Neurological:     Mental Status: She is alert and oriented to person, place, and time.  Psychiatric:        Mood and Affect: Mood normal.     BP 130/82 (BP Location: Right Arm, Patient Position: Sitting, Cuff Size: Large)   Pulse 75   Temp (!) 97.5 F (36.4 C) (Temporal)   Ht '5\' 3"'$  (1.6 m)   Wt 173 lb (78.5 kg)   SpO2 96%   BMI 30.65 kg/m  Wt Readings from Last 3 Encounters:  02/11/22 173 lb (78.5 kg)  02/02/22 172 lb (78 kg)  01/05/22 173 lb 6.4 oz (78.7 kg)       Assessment & Plan:   Problem List Items Addressed This Visit       Musculoskeletal and Integument   Allergic contact dermatitis due to plants, except food - Primary    Continue oral steroid prescribed by urgent care. May use topical triamcinolone prescribed by me today for the next week.  Increase Zyrtec to twice daily. Use cool baths, compresses to help with  itching. Follow up if any sign of secondary bacterial infection.       Relevant Medications   triamcinolone cream (KENALOG) 0.1 %   Other Visit Diagnoses     Pruritus       Relevant Medications   triamcinolone cream (KENALOG) 0.1 %       I am having Careli W. Froio start on triamcinolone cream. I am also having her maintain her cetirizine, amphetamine-dextroamphetamine, betamethasone dipropionate, ibuprofen, clobetasol cream, tacrolimus, tretinoin, hydrochlorothiazide, amLODipine, atorvastatin, metoprolol tartrate, and predniSONE.  Meds ordered this encounter  Medications   triamcinolone cream (KENALOG) 0.1 %    Sig: Apply 1 Application topically 2 (two) times daily.    Dispense:  30 g    Refill:  0    Order Specific Question:   Supervising Provider    Answer:   Pricilla Holm A [[id number]]

## 2022-02-11 NOTE — Patient Instructions (Signed)
Continue oral steroids.   Use the topical steroid cream twice daily as needed for the next week.   You may take Zyrtec twice daily for the next few days.   Use cool baths, showers and compresses to help with itching.   Follow up if you think you may be developing a secondary bacterial infection.    Poison Ivy Dermatitis Poison ivy dermatitis is redness and soreness of the skin caused by chemicals in the leaves of the poison ivy plant. You may have very bad itching, swelling, a rash, and blisters. What are the causes? Touching a poison ivy plant. Touching something that has the chemical on it. This may include animals or objects that have come in contact with the plant. What increases the risk? Going outdoors often in wooded or [place] areas. Going outdoors without wearing protective clothing, such as closed shoes, long pants, and a long-sleeved shirt. What are the signs or symptoms?  Skin redness. Very bad itching. A rash that often includes bumps and blisters. The rash usually appears 48 hours after exposure, if you have been exposed before. If this is the first time you have been exposed, the rash may not appear until a week after exposure. Swelling. This may occur if the reaction is very bad. Symptoms usually last for 1-2 weeks. The first time you develop this condition, symptoms may last 3-4 weeks. How is this treated? This condition may be treated with: Hydrocortisone cream or calamine lotion to relieve itching. Oatmeal baths to soothe the skin. Medicines, such as over-the-counter antihistamine tablets. Oral steroid medicine for more severe reactions. Follow these instructions at home: Medicines Take or apply over-the-counter and prescription medicines only as told by your doctor. Use hydrocortisone cream or calamine lotion as needed to help with itching. General instructions Do not scratch or rub your skin. Put a cold, wet cloth (cold compress) on the affected areas or  take baths in cool water. This will help with itching. Avoid hot baths and showers. Take oatmeal baths as needed. Use colloidal oatmeal. You can get this at a pharmacy or grocery store. Follow the instructions on the package. While you have the rash, wash your clothes right after you wear them. Keep all follow-up visits as told by your health care provider. This is important. How is this prevented?  Know what poison ivy looks like, so you can avoid it. This plant has three leaves with flowering branches on a single stem. The leaves are glossy. The leaves have uneven edges that come to a point at the front. If you touch poison ivy, wash your skin with soap and water right away. Be sure to wash under your fingernails. When hiking or camping, wear long pants, a long-sleeved shirt, tall socks, and hiking boots. You can also use a lotion on your skin that helps to prevent contact with poison ivy. If you think that your clothes or outdoor gear came in contact with poison ivy, rinse them off with a garden hose before you bring them inside your house. When doing yard work or gardening, wear gloves, long sleeves, long pants, and boots. Wash your garden tools and gloves if they come in contact with poison ivy. If you think that your pet has come into contact with poison ivy, wash him or her with pet shampoo and water. Make sure to wear gloves while washing your pet. Contact a doctor if: You have open sores in the rash area. You have more redness, swelling, or pain in the  rash area. You have redness that spreads beyond the rash area. You have fluid, blood, or pus coming from the rash area. You have a fever. You have a rash over a large area of your body. You have a rash on your eyes, mouth, or genitals. Your rash does not get better after a few weeks. Get help right away if: Your face swells or your eyes swell shut. You have trouble breathing. You have trouble swallowing. These symptoms may be an  emergency. Do not wait to see if the symptoms will go away. Get medical help right away. Call your local emergency services (911 in the [place] Do not drive yourself to the hospital. Summary Poison ivy dermatitis is redness and soreness of the skin caused by chemicals in the leaves of the poison ivy plant. You may have skin redness, very bad itching, swelling, and a rash. Do not scratch or rub your skin. Take or apply over-the-counter and prescription medicines only as told by your doctor. This information is not intended to replace advice given to you by your health care provider. Make sure you discuss any questions you have with your health care provider. Document Revised: 05/26/2021 Document Reviewed: 05/26/2021 Elsevier Patient Education  [place].

## 2022-02-11 NOTE — Assessment & Plan Note (Signed)
Continue oral steroid prescribed by urgent care. May use topical triamcinolone prescribed by me today for the next week. Increase Zyrtec to twice daily. Use cool baths, compresses to help with itching. Follow up if any sign of secondary bacterial infection.

## 2022-02-25 ENCOUNTER — Encounter: Payer: Self-pay | Admitting: Internal Medicine

## 2022-02-25 NOTE — Progress Notes (Signed)
Outside notes received. Information abstracted. Notes sent to scan.  

## 2022-05-15 ENCOUNTER — Telehealth: Payer: Self-pay | Admitting: *Deleted

## 2022-05-15 NOTE — Telephone Encounter (Signed)
Called patient and left message for patient to call office to schedule her medicare AWV,  Please schedule patient as a initial AWV, if patient calls back to schedule

## 2022-05-28 ENCOUNTER — Ambulatory Visit: Payer: Medicare Other | Admitting: Physician Assistant

## 2022-06-16 ENCOUNTER — Encounter (INDEPENDENT_AMBULATORY_CARE_PROVIDER_SITE_OTHER): Payer: Self-pay | Admitting: Family Medicine

## 2022-06-16 ENCOUNTER — Ambulatory Visit (INDEPENDENT_AMBULATORY_CARE_PROVIDER_SITE_OTHER): Payer: Medicare Other | Admitting: Family Medicine

## 2022-06-16 VITALS — BP 140/86 | HR 74 | Temp 97.8°F | Ht 62.0 in | Wt 170.0 lb

## 2022-06-16 DIAGNOSIS — R7303 Prediabetes: Secondary | ICD-10-CM | POA: Diagnosis not present

## 2022-06-16 DIAGNOSIS — I10 Essential (primary) hypertension: Secondary | ICD-10-CM

## 2022-06-16 DIAGNOSIS — Z6831 Body mass index (BMI) 31.0-31.9, adult: Secondary | ICD-10-CM

## 2022-06-16 DIAGNOSIS — E7849 Other hyperlipidemia: Secondary | ICD-10-CM

## 2022-06-16 DIAGNOSIS — E669 Obesity, unspecified: Secondary | ICD-10-CM

## 2022-06-16 DIAGNOSIS — Z0289 Encounter for other administrative examinations: Secondary | ICD-10-CM

## 2022-06-20 ENCOUNTER — Other Ambulatory Visit: Payer: Self-pay | Admitting: Internal Medicine

## 2022-06-22 DIAGNOSIS — E7849 Other hyperlipidemia: Secondary | ICD-10-CM | POA: Insufficient documentation

## 2022-06-22 DIAGNOSIS — R7303 Prediabetes: Secondary | ICD-10-CM | POA: Insufficient documentation

## 2022-06-29 NOTE — Progress Notes (Signed)
Office: [phone]  /  Fax: [phone] .  Initial Visit  Desiree Andrews was seen in clinic today to evaluate for obesity. She is interested in losing weight to improve overall health and reduce the risk of weight related complications. She presents today to review program treatment options, initial physical assessment, and evaluation.     She was referred by: PCP, also a is a friend of Desiree Andrews, who is my patient.   When asked what else they would like to accomplish? She states:improve quality of life.  When asked how has your weight affected you? She states: Has affected self-esteem, Having fatigue, and Having poor endurance  Some associated conditions: Hypertension, Hyperlipidemia, and Prediabetes  Contributing factors: Reduced physical activity  Weight promoting medications identified: Beta-blockers, she is taking Lopressor.   Current nutrition plan: None  Current level of physical activity: Walking  Current or previous pharmacotherapy: None  Response to medication: Never tried medications   Past medical history includes:   Past Medical History:  Diagnosis Date   Allergy    Anxiety    Cancer ([place])    breast-left    Cardiopulmonary arrest ([place]) 1991   Postpartum,? Pulmonary thromboembolism   Depression    off cymbalta now   Hyperlipidemia    Hypertension      Objective:   BP (!) 140/86   Pulse 74   Temp 97.8 F (36.6 C)   Ht '5\' 2"'$  (1.575 m)   Wt 170 lb (77.1 kg)   SpO2 97%   BMI 31.09 kg/m  She was weighed on the bioimpedance scale: Body mass index is 31.09 kg/m.  Peak Weight:177 lbs ,Visceral Fat Rating: 11, Body Fat%: 40.3  General:  Alert, oriented and cooperative. Patient is in no acute distress.  Respiratory: Normal respiratory effort, no problems with respiration noted  Extremities: Normal range of motion.    Mental Status: Normal mood and affect. Normal behavior. Normal judgment and thought content.   Assessment and Plan:  1.  Pre-diabetes She is currently not taking any medications.  A1c 6.3, 08/12/21 A1c 6.0, 02/03/22  Discussed with patient how 5-7% of weight loss can positively affect her multiple medication problems.  Briefly discussed how foods affect hunger and cravings.   2. Essential hypertension Recent serum creatinine=0.84 on 01/2022.  She is taking Norvasc, HCTZ, Lopressor.  Blood pressure is high normal, continue current medications per PCP for now.  If she decides to join the program, will start PNP low in salt.   3. Other hyperlipidemia Prior cholesterol panel 06/23 with PCP.  HCW237, Triglycerides 614, HDL 49.  She is taking Lipitor.    Continue current medications per PCP.  Discussed with patient how the foods that she eats is affecting her cholesterol levels.  Severity of the triglycerides greater than 500.  We will obtain labs in the future as needed.   4. Obesity,current BMI 31.1 Patient will join program today.  Anticipatory guidance provided.   We reviewed weight, biometrics, associated medical conditions and contributing factors with patient. She would benefit from weight loss therapy via a modified calorie, low-carb, high-protein nutritional plan tailored to their REE (resting energy expenditure) which will be determined by indirect calorimetry.  We will also assess for cardiometabolic risk and nutritional derangements via fasting serologies at her next appointment.      Obesity Treatment / Action Plan:  Patient will work on garnering support from family and friends to begin weight loss journey. Will work on eliminating or reducing the presence  of highly palatable, calorie dense foods in the home. Will complete provided nutritional and psychosocial assessment questionnaire before the next appointment. Will be scheduled for indirect calorimetry to determine resting energy expenditure in a fasting state.  This will allow [place] to create a reduced calorie, high-protein meal plan to promote  loss of fat mass while preserving muscle mass.  Obesity Education Performed Today:  She was weighed on the bioimpedance scale and results were discussed and documented in the synopsis.  We discussed obesity as a disease and the importance of a more detailed evaluation of all the factors contributing to the disease.  We discussed the importance of long term lifestyle changes which include nutrition, exercise and behavioral modifications as well as the importance of customizing this to her specific health and social needs.  We discussed the benefits of reaching a healthier weight to alleviate the symptoms of existing conditions and reduce the risks of the biomechanical, metabolic and psychological effects of obesity.  Desiree Andrews appears to be in the action stage of change and states they are ready to start intensive lifestyle modifications and behavioral modifications.  40 minutes was spent today on this visit including the above counseling, pre-visit chart review, and post-visit documentation.  Reviewed by clinician on day of visit: allergies, medications, problem list, medical history, surgical history, family history, social history, and previous encounter notes.  I, Davy Pique, RMA, am acting as Location manager for [employer], DO.    I have reviewed the above documentation for accuracy and completeness, and I agree with the above. Desiree Andrews, D.O.  The [place] was signed into law in 2016 which includes the topic of electronic health records.  This provides immediate access to information in MyChart.  This includes consultation notes, operative notes, office notes, lab results and pathology reports.  If you have any questions about what you read please let us know at your next visit so we can discuss your concerns and take corrective action if need be.  We are right here with you.

## 2022-06-30 ENCOUNTER — Encounter (INDEPENDENT_AMBULATORY_CARE_PROVIDER_SITE_OTHER): Payer: Self-pay | Admitting: Family Medicine

## 2022-06-30 ENCOUNTER — Ambulatory Visit (INDEPENDENT_AMBULATORY_CARE_PROVIDER_SITE_OTHER): Payer: Medicare Other | Admitting: Family Medicine

## 2022-06-30 VITALS — BP 154/83 | HR 116 | Temp 98.2°F | Ht 62.0 in | Wt 170.8 lb

## 2022-06-30 DIAGNOSIS — Z1331 Encounter for screening for depression: Secondary | ICD-10-CM | POA: Diagnosis not present

## 2022-06-30 DIAGNOSIS — R0602 Shortness of breath: Secondary | ICD-10-CM | POA: Insufficient documentation

## 2022-06-30 DIAGNOSIS — I10 Essential (primary) hypertension: Secondary | ICD-10-CM | POA: Diagnosis not present

## 2022-06-30 DIAGNOSIS — E7849 Other hyperlipidemia: Secondary | ICD-10-CM

## 2022-06-30 DIAGNOSIS — R5383 Other fatigue: Secondary | ICD-10-CM | POA: Insufficient documentation

## 2022-06-30 DIAGNOSIS — R7303 Prediabetes: Secondary | ICD-10-CM

## 2022-06-30 DIAGNOSIS — E669 Obesity, unspecified: Secondary | ICD-10-CM | POA: Diagnosis not present

## 2022-06-30 DIAGNOSIS — Z6831 Body mass index (BMI) 31.0-31.9, adult: Secondary | ICD-10-CM

## 2022-07-01 LAB — COMPREHENSIVE METABOLIC PANEL
ALT: 28 IU/L (ref 0–32)
AST: 26 IU/L (ref 0–40)
Albumin/Globulin Ratio: 2 (ref 1.2–2.2)
Albumin: 4.6 g/dL (ref 3.9–4.9)
Alkaline Phosphatase: 85 IU/L (ref 44–121)
BUN/Creatinine Ratio: 26 (ref 12–28)
BUN: 14 mg/dL (ref 8–27)
Bilirubin Total: 0.4 mg/dL (ref 0.0–1.2)
CO2: 22 mmol/L (ref 20–29)
Calcium: 9.7 mg/dL (ref 8.7–10.3)
Chloride: 101 mmol/L (ref 96–106)
Creatinine, Ser: 0.54 mg/dL — ABNORMAL LOW (ref 0.57–1.00)
Globulin, Total: 2.3 g/dL (ref 1.5–4.5)
Glucose: 110 mg/dL — ABNORMAL HIGH (ref 70–99)
Potassium: 4.5 mmol/L (ref 3.5–5.2)
Sodium: 141 mmol/L (ref 134–144)
Total Protein: 6.9 g/dL (ref 6.0–8.5)
eGFR: 100 mL/min/{1.73_m2} (ref >59)

## 2022-07-01 LAB — VITAMIN D 25 HYDROXY (VIT D DEFICIENCY, FRACTURES): Vit D, 25-Hydroxy: 42.5 ng/mL (ref 30.0–100.0)

## 2022-07-01 LAB — CBC WITH DIFFERENTIAL/PLATELET
Basophils Absolute: 0.1 10*3/uL (ref 0.0–0.2)
Basos: 1 %
EOS (ABSOLUTE): 0.2 10*3/uL (ref 0.0–0.4)
Eos: 4 %
Hematocrit: 43.7 % (ref 34.0–46.6)
Hemoglobin: 14.8 g/dL (ref 11.1–15.9)
Immature Grans (Abs): 0 10*3/uL (ref 0.0–0.1)
Immature Granulocytes: 0 %
Lymphocytes Absolute: 2.5 10*3/uL (ref 0.7–3.1)
Lymphs: 42 %
MCH: 31.4 pg (ref 26.6–33.0)
MCHC: 33.9 g/dL (ref 31.5–35.7)
MCV: 93 fL (ref 79–97)
Monocytes Absolute: 0.5 10*3/uL (ref 0.1–0.9)
Monocytes: 8 %
Neutrophils Absolute: 2.6 10*3/uL (ref 1.4–7.0)
Neutrophils: 45 %
Platelets: 251 10*3/uL (ref 150–450)
RBC: 4.71 x10E6/uL (ref 3.77–5.28)
RDW: 12.1 % (ref 11.7–15.4)
WBC: 6 10*3/uL (ref 3.4–10.8)

## 2022-07-01 LAB — VITAMIN B12: Vitamin B-12: 628 pg/mL (ref 232–1245)

## 2022-07-01 LAB — LIPID PANEL WITH LDL/HDL RATIO
Cholesterol, Total: 216 mg/dL — ABNORMAL HIGH (ref 100–199)
HDL: 52 mg/dL (ref >39)
LDL Chol Calc (NIH): 110 mg/dL — ABNORMAL HIGH (ref 0–99)
LDL/HDL Ratio: 2.1 ratio (ref 0.0–3.2)
Triglycerides: 316 mg/dL — ABNORMAL HIGH (ref 0–149)
VLDL Cholesterol Cal: 54 mg/dL — ABNORMAL HIGH (ref 5–40)

## 2022-07-01 LAB — FOLATE: Folate: 18 ng/mL (ref >3.0)

## 2022-07-01 LAB — HEMOGLOBIN A1C
Est. average glucose Bld gHb Est-mCnc: 134 mg/dL
Hgb A1c MFr Bld: 6.3 % — ABNORMAL HIGH (ref 4.8–5.6)

## 2022-07-01 LAB — INSULIN, RANDOM: INSULIN: 14.7 u[IU]/mL (ref 2.6–24.9)

## 2022-07-01 LAB — TSH: TSH: 0.942 u[IU]/mL (ref 0.450–4.500)

## 2022-07-01 LAB — T4, FREE: Free T4: 1.17 ng/dL (ref 0.82–1.77)

## 2022-07-14 ENCOUNTER — Ambulatory Visit (INDEPENDENT_AMBULATORY_CARE_PROVIDER_SITE_OTHER): Payer: Medicare Other | Admitting: Family Medicine

## 2022-07-14 ENCOUNTER — Encounter (INDEPENDENT_AMBULATORY_CARE_PROVIDER_SITE_OTHER): Payer: Self-pay | Admitting: Family Medicine

## 2022-07-14 VITALS — BP 143/75 | HR 72 | Temp 98.1°F | Ht 62.0 in | Wt 168.6 lb

## 2022-07-14 DIAGNOSIS — I10 Essential (primary) hypertension: Secondary | ICD-10-CM

## 2022-07-14 DIAGNOSIS — E559 Vitamin D deficiency, unspecified: Secondary | ICD-10-CM | POA: Diagnosis not present

## 2022-07-14 DIAGNOSIS — E66811 Obesity, class 1: Secondary | ICD-10-CM

## 2022-07-14 DIAGNOSIS — R7303 Prediabetes: Secondary | ICD-10-CM | POA: Diagnosis not present

## 2022-07-14 DIAGNOSIS — E7849 Other hyperlipidemia: Secondary | ICD-10-CM | POA: Diagnosis not present

## 2022-07-14 DIAGNOSIS — Z6830 Body mass index (BMI) 30.0-30.9, adult: Secondary | ICD-10-CM

## 2022-07-14 DIAGNOSIS — E669 Obesity, unspecified: Secondary | ICD-10-CM

## 2022-07-14 MED ORDER — VITAMIN D (CHOLECALCIFEROL) 25 MCG (1000 UT) PO CAPS: ORAL_CAPSULE | ORAL | Status: DC

## 2022-07-18 NOTE — Progress Notes (Signed)
Chief Complaint:   OBESITY Desiree Andrews (MR# [id number]) is a 66 y.o. female who presents for evaluation and treatment of obesity and related comorbidities. Current Body mass index is 31.24 kg/m. Desiree Andrews has been struggling with her weight for many years and has been unsuccessful in either losing weight, maintaining weight loss, or reaching her healthy weight goal.  Desiree Andrews is currently in the action stage of change and ready to dedicate time achieving and maintaining a healthier weight. [name] is interested in becoming our patient and working on intensive lifestyle modifications including (but not limited to) diet and exercise for weight loss.  Desiree Andrews is divorced, single, and doesn't cook. She drinks 2-3 glasses of wine a day. Pt says, "I make poor choices with food." She is always in a rush and skips meals at times.  Desiree Andrews's habits were reviewed today and are as follows: her desired weight loss is 30 lbs, her heaviest weight ever was 176 pounds, she is frequently drinking liquids with calories, and she struggles with emotional eating.  Depression Screen Maddie's Food and Mood (modified PHQ-9) score was 2.     01/05/2022    9:20 AM  Depression screen PHQ 2/9  Decreased Interest 0  Down, Depressed, Hopeless 0  PHQ - 2 Score 0  Altered sleeping 1  Tired, decreased energy 1  Change in appetite 0  Feeling bad or failure about yourself  0  Trouble concentrating 0  Moving slowly or fidgety/restless 0  Suicidal thoughts 0  PHQ-9 Score 2  Difficult doing work/chores Not difficult at all   Subjective:   1. Other fatigue Desiree Andrews admits to daytime somnolence and admits to waking up still tired. Patient has a history of symptoms of daytime fatigue and morning fatigue. [name] generally gets 7 hours of sleep per night, and states that she has poor sleep quality. Snoring is present. Apneic episodes are present. Epworth Sleepiness Score is 5. Measured IC much higher than calculated at  1814  ECG done today, essentially unchanged from previous ECG 04/2020. No acute findings.  2. SOB (shortness of breath) Desiree Andrews notes increasing shortness of breath with exercising and seems to be worsening over time with weight gain. She notes getting out of breath sooner with activity than she used to. This has gotten worse recently. Desiree Andrews denies shortness of breath at rest or orthopnea.  3. Prediabetes Pt has family history of diabetes. She is not on medication.  4. Hypertension, essential Ryn didn't take her BP meds yet today, again. She also didn't take them last time. Medication: Norvasc, HCTZ, lopressor  5. Other hyperlipidemia Pt takes Lipitor.  Assessment/Plan:   Orders Placed This Encounter  Procedures   CBC with Differential/Platelet   Comprehensive metabolic panel   Folate   Hemoglobin A1c   Insulin, random   VITAMIN D 25 Hydroxy (Vit-D Deficiency, Fractures)   Vitamin B12   TSH   T4, free   Lipid Panel With LDL/HDL Ratio   EKG 12-Lead    There are no discontinued medications.   No orders of the defined types were placed in this encounter.    1. Other fatigue Desiree Andrews does feel that her weight is causing her energy to be lower than it should be. Fatigue may be related to obesity, depression or many other causes. Labs will be ordered, and in the meanwhile, Desiree Andrews will focus on self care including making healthy food choices, increasing physical activity and focusing on stress reduction. Results all reviewed with  pt and education provided.  Lab/Orders today or future: - EKG 12-Lead - Folate - Insulin, random - VITAMIN D 25 Hydroxy (Vit-D Deficiency, Fractures) - Vitamin B12 - TSH - T4, free  2. SOB (shortness of breath) Desiree Andrews does feel that she gets out of breath more easily that she used to when she exercises. Desiree Andrews's shortness of breath appears to be obesity related and exercise induced. She has agreed to work on weight loss and gradually increase exercise to  treat her exercise induced shortness of breath. Will continue to monitor closely.  3. Prediabetes Desiree Andrews will continue to work on weight loss, exercise, and decreasing simple carbohydrates to help decrease the risk of diabetes. Follow prudent nutritional plan.  4. Hypertension, essential BP still elevated today and not at goal. We will follow closely. Pt encouraged to always take meds wit ha sip of water each morning.  5. Other hyperlipidemia Continue meds. Decrease saturated and trans fats by following prudent nutritional plan.  6. Depression screening Behavior modification techniques were discussed today to help [name] deal with her emotional/non-hunger eating behaviors.  Orders and follow up as documented in patient record.   7. Class 1 obesity without serious comorbidity with body mass index (BMI) of 31.0 to 31.9 in adult, unspecified obesity type - CBC with Differential/Platelet - Comprehensive metabolic panel - Hemoglobin A1c - Lipid Panel With LDL/HDL Ratio  [name] is currently in the action stage of change and her goal is to continue with weight loss efforts. I recommend Desiree Andrews begin the structured treatment plan as follows:  She has agreed to the Category 2 Plan with breakfast options.  Exercise goals:  As is    Behavioral modification strategies: increasing lean protein intake, decreasing simple carbohydrates, and no skipping meals.  She was informed of the importance of frequent follow-up visits to maximize her success with intensive lifestyle modifications for her multiple health conditions. She was informed we would discuss her lab results at her next visit unless there is a critical issue that needs to be addressed sooner. Desiree Andrews agreed to keep her next visit at the agreed upon time to discuss these results.  Objective:   Blood pressure (!) 154/83, pulse (!) 116, temperature 98.2 F (36.8 C), height '5\' 2"'$  (1.575 m), weight 170 lb 12.8 oz (77.5 kg), SpO2 94 %. Body mass index  is 31.24 kg/m.  EKG: Normal sinus rhythm, rate 75.  Indirect Calorimeter completed today shows a VO2 of 263 and a REE of 1814.  Her calculated basal metabolic rate is 8185 thus her basal metabolic rate is better than expected.  General: Cooperative, alert, well developed, in no acute distress. HEENT: Conjunctivae and lids unremarkable. Cardiovascular: Regular rhythm.  Lungs: Normal work of breathing. Neurologic: No focal deficits.   Lab Results  Component Value Date   CREATININE 0.54 (L) 06/30/2022   BUN 14 06/30/2022   NA 141 06/30/2022   K 4.5 06/30/2022   CL 101 06/30/2022   CO2 22 06/30/2022   Lab Results  Component Value Date   ALT 28 06/30/2022   AST 26 06/30/2022   ALKPHOS 85 06/30/2022   BILITOT 0.4 06/30/2022   Lab Results  Component Value Date   HGBA1C 6.3 (H) 06/30/2022   HGBA1C 6.0 02/03/2022   HGBA1C 6.3 08/12/2021   HGBA1C 6.4 04/30/2021   HGBA1C 6.4 08/09/2020   Lab Results  Component Value Date   INSULIN 14.7 06/30/2022   Lab Results  Component Value Date   TSH 0.942 06/30/2022  Lab Results  Component Value Date   CHOL 216 (H) 06/30/2022   HDL 52 06/30/2022   LDLCALC 110 (H) 06/30/2022   LDLDIRECT 123.0 02/03/2022   TRIG 316 (H) 06/30/2022   CHOLHDL 5 02/03/2022   Lab Results  Component Value Date   WBC 6.0 06/30/2022   HGB 14.8 06/30/2022   HCT 43.7 06/30/2022   MCV 93 06/30/2022   PLT 251 06/30/2022    Attestation Statements:   Reviewed by clinician on day of visit: allergies, medications, problem list, medical history, surgical history, family history, social history, and previous encounter notes.  I, Kathlene November, BS, CMA, am acting as transcriptionist for [employer], DO.  I have reviewed the above documentation for accuracy and completeness, and I agree with the above. Marjory Sneddon, D.O.  The [place] was signed into law in 2016 which includes the topic of electronic health records.  This provides  immediate access to information in MyChart.  This includes consultation notes, operative notes, office notes, lab results and pathology reports.  If you have any questions about what you read please let us know at your next visit so we can discuss your concerns and take corrective action if need be.  We are right here with you.

## 2022-07-21 ENCOUNTER — Other Ambulatory Visit: Payer: Self-pay | Admitting: Internal Medicine

## 2022-07-21 DIAGNOSIS — Z1231 Encounter for screening mammogram for malignant neoplasm of breast: Secondary | ICD-10-CM

## 2022-07-28 ENCOUNTER — Ambulatory Visit (INDEPENDENT_AMBULATORY_CARE_PROVIDER_SITE_OTHER): Payer: Medicare Other | Admitting: Family Medicine

## 2022-08-03 ENCOUNTER — Ambulatory Visit (INDEPENDENT_AMBULATORY_CARE_PROVIDER_SITE_OTHER): Payer: Medicare Other | Admitting: Physician Assistant

## 2022-08-03 ENCOUNTER — Encounter: Payer: Self-pay | Admitting: Internal Medicine

## 2022-08-03 ENCOUNTER — Encounter (INDEPENDENT_AMBULATORY_CARE_PROVIDER_SITE_OTHER): Payer: Self-pay | Admitting: Physician Assistant

## 2022-08-03 VITALS — BP 148/85 | HR 67 | Temp 97.7°F | Ht 62.0 in | Wt 169.0 lb

## 2022-08-03 DIAGNOSIS — I10 Essential (primary) hypertension: Secondary | ICD-10-CM

## 2022-08-03 DIAGNOSIS — R7303 Prediabetes: Secondary | ICD-10-CM | POA: Diagnosis not present

## 2022-08-03 DIAGNOSIS — Z6831 Body mass index (BMI) 31.0-31.9, adult: Secondary | ICD-10-CM

## 2022-08-03 DIAGNOSIS — E669 Obesity, unspecified: Secondary | ICD-10-CM

## 2022-08-03 MED ORDER — METFORMIN HCL 500 MG PO TABS: 500.0000 mg | ORAL_TABLET | Freq: Every day | ORAL | 0 refills | Status: DC

## 2022-08-03 NOTE — Progress Notes (Unsigned)
Subjective:    Patient ID: Desiree Andrews, female    DOB: Oct 19, 1955, 66 y.o.   MRN: [id number]     HPI Desiree Andrews is here for follow up of her chronic medical problems, including htn, hld, b/l leg edema, prediabetes, vit d def  Had blood work done on 11/7  To be started on metformin by the [facility].  Has improved her diet - cut out sugars, eating more protein.  She is walking about 10000 steps a day.      Medications and allergies reviewed with patient and updated if appropriate.  Current Outpatient Medications on File Prior to Visit  Medication Sig Dispense Refill   amLODipine (NORVASC) 2.5 MG tablet TAKE 1 TABLET(2.5 MG) BY MOUTH DAILY 90 tablet 1   amphetamine-dextroamphetamine (ADDERALL) 20 MG tablet Take 10 mg by mouth 2 (two) times daily.   0   atorvastatin (LIPITOR) 40 MG tablet TAKE 1 TABLET(20 MG) BY MOUTH DAILY 90 tablet 2   betamethasone dipropionate (DIPROLENE) 0.05 % ointment Apply topically 2 (two) times daily. 30 g 0   cetirizine (ZYRTEC) 10 MG tablet Take 10 mg by mouth as needed for allergies.      clobetasol cream (TEMOVATE) 9.89 % Apply 1 application topically 2 (two) times daily as needed. 30 g 2   hydrochlorothiazide (HYDRODIURIL) 25 MG tablet TAKE 1 TABLET(25 MG) BY MOUTH DAILY 90 tablet 0   ibuprofen (ADVIL) 600 MG tablet Take 600 mg by mouth every 8 (eight) hours as needed.     metFORMIN (GLUCOPHAGE) 500 MG tablet Take 1 tablet (500 mg total) by mouth daily. 30 tablet 0   metoprolol tartrate (LOPRESSOR) 50 MG tablet TAKE 1 AND 1/2 TABLETS(75 MG) BY MOUTH TWICE DAILY 270 tablet 1   tacrolimus (PROTOPIC) 0.1 % ointment Apply topically 2 (two) times daily. 100 g 2   triamcinolone cream (KENALOG) 0.1 % Apply 1 Application topically 2 (two) times daily. 30 g 0   Vitamin D, Cholecalciferol, 25 MCG (1000 UT) CAPS 1000 IU vit D daily 60 capsule    No current facility-administered medications on file prior to visit.     Review  of Systems  Constitutional:  Negative for appetite change and fever.  Respiratory:  Negative for cough, shortness of breath and wheezing.   Cardiovascular:  Negative for chest pain, palpitations and leg swelling.  Neurological:  Negative for light-headedness and headaches.       Objective:   Vitals:   08/04/22 0914  BP: 124/76  Pulse: 77  Temp: 98 F (36.7 C)  SpO2: 96%   BP Readings from Last 3 Encounters:  08/04/22 124/76  08/03/22 (!) 148/85  07/14/22 (!) 143/75   Wt Readings from Last 3 Encounters:  08/04/22 169 lb (76.7 kg)  08/03/22 169 lb (76.7 kg)  07/14/22 168 lb 9.6 oz (76.5 kg)   Body mass index is 30.91 kg/m.    Physical Exam Constitutional:      General: She is not in acute distress.    Appearance: Normal appearance.  HENT:     Head: Normocephalic and atraumatic.  Eyes:     Conjunctiva/sclera: Conjunctivae normal.  Cardiovascular:     Rate and Rhythm: Normal rate and regular rhythm.     Heart sounds: Normal heart sounds. No murmur heard. Pulmonary:     Effort: Pulmonary effort is normal. No respiratory distress.     Breath sounds: Normal breath sounds. No wheezing.  Musculoskeletal:  Cervical back: Neck supple.     Right lower leg: No edema.     Left lower leg: No edema.  Lymphadenopathy:     Cervical: No cervical adenopathy.  Skin:    General: Skin is warm and dry.     Findings: No rash.  Neurological:     Mental Status: She is alert. Mental status is at baseline.  Psychiatric:        Mood and Affect: Mood normal.        Behavior: Behavior normal.        Lab Results  Component Value Date   WBC 6.0 06/30/2022   HGB 14.8 06/30/2022   HCT 43.7 06/30/2022   PLT 251 06/30/2022   GLUCOSE 110 (H) 06/30/2022   CHOL 216 (H) 06/30/2022   TRIG 316 (H) 06/30/2022   HDL 52 06/30/2022   LDLDIRECT 123.0 02/03/2022   LDLCALC 110 (H) 06/30/2022   ALT 28 06/30/2022   AST 26 06/30/2022   NA 141 06/30/2022   K 4.5 06/30/2022   CL 101  06/30/2022   CREATININE 0.54 (L) 06/30/2022   BUN 14 06/30/2022   CO2 22 06/30/2022   TSH 0.942 06/30/2022   HGBA1C 6.3 (H) 06/30/2022     Assessment & Plan:    See Problem List for Assessment and Plan of chronic medical problems.

## 2022-08-03 NOTE — Patient Instructions (Addendum)
      Blood work was ordered.   The lab is on the first floor.    Medications changes include :       A referral was ordered for XXX.     Someone will call you to schedule an appointment.    Return in about 6 months (around 02/03/2023) for Physical Exam.

## 2022-08-04 ENCOUNTER — Ambulatory Visit (INDEPENDENT_AMBULATORY_CARE_PROVIDER_SITE_OTHER): Payer: Medicare Other | Admitting: Internal Medicine

## 2022-08-04 VITALS — BP 124/76 | HR 77 | Temp 98.0°F | Ht 62.0 in | Wt 169.0 lb

## 2022-08-04 DIAGNOSIS — I10 Essential (primary) hypertension: Secondary | ICD-10-CM

## 2022-08-04 DIAGNOSIS — E785 Hyperlipidemia, unspecified: Secondary | ICD-10-CM | POA: Diagnosis not present

## 2022-08-04 DIAGNOSIS — E7849 Other hyperlipidemia: Secondary | ICD-10-CM

## 2022-08-04 DIAGNOSIS — R600 Localized edema: Secondary | ICD-10-CM

## 2022-08-04 DIAGNOSIS — E559 Vitamin D deficiency, unspecified: Secondary | ICD-10-CM

## 2022-08-04 DIAGNOSIS — R7303 Prediabetes: Secondary | ICD-10-CM

## 2022-08-04 NOTE — Assessment & Plan Note (Signed)
Chronic A1c 6.3% Will be starting metformin soon Working with [facility] for weight loss and lifestyle changes

## 2022-08-04 NOTE — Assessment & Plan Note (Signed)
Chronic Controlled Continue hydrochlorothiazide 25 mg daily

## 2022-08-04 NOTE — Assessment & Plan Note (Signed)
Chronic Last vitamin D level normal Continue vitamin D supplementation

## 2022-08-04 NOTE — Assessment & Plan Note (Signed)
Chronic Regular exercise and healthy diet encouraged LDL 110-ideally would like this lower, but I think with lifestyle changes this will improve Continue atorvastatin 40 mg daily

## 2022-08-04 NOTE — Assessment & Plan Note (Signed)
Chronic Blood pressure well controlled Continue amlodipine 2.5 mg daily, HCTZ 25 mg daily, metoprolol 75 mg twice daily

## 2022-08-09 ENCOUNTER — Other Ambulatory Visit: Payer: Self-pay | Admitting: Internal Medicine

## 2022-08-11 DIAGNOSIS — E782 Mixed hyperlipidemia: Secondary | ICD-10-CM | POA: Insufficient documentation

## 2022-08-11 DIAGNOSIS — I10 Essential (primary) hypertension: Secondary | ICD-10-CM | POA: Insufficient documentation

## 2022-08-11 DIAGNOSIS — E7849 Other hyperlipidemia: Secondary | ICD-10-CM | POA: Insufficient documentation

## 2022-08-11 NOTE — Progress Notes (Signed)
Chief Complaint:   OBESITY Desiree Andrews is here to discuss her progress with her obesity treatment plan along with follow-up of her obesity related diagnoses. Desiree Andrews is on the Category 2 Plan with breakfast options and states she is following her eating plan approximately 0% of the time. Desiree Andrews states she is walking 10,000 steps 7 times per week.  Today's visit was #: 2 Starting weight: 170 LBS Starting date: 06/30/2022 Today's weight: 168 LBS Today's date: 07/14/2022 Total lbs lost to date: 2 LBS Total lbs lost since last in-office visit: 2 LBS  Interim History: Desiree Andrews is here today for her first follow-up office visit since starting the program with [place].  All blood work/ lab tests that were recently ordered by myself or an outside provider were reviewed with patient today per their request.   Extended time was spent counseling her on all new disease processes that were discovered or preexisting ones that are affected by BMI.  she understands that many of these abnormalities will need to monitored regularly along with the current treatment plan of prudent dietary changes, in which we are making each and every office visit, to improve these health parameters.  Patient had a tooth extraction and could not eat much.  Patient had root canal surgery yesterday for it.  Patient is drinking 8 to 10 ounces of wine per day with unknown calories.  Also having powdered sugary coffee daily with questionable calories.  Patient snacks on 100-calorie snack packs with nuts or other items.  Also using a couple pads of butter on her toast.  Subjective:   1. Prediabetes Worsening.  Discussed labs with patient today. A1c on 02/03/2022 was 6.0, now it is worse at 6.3.  Positive strong family history of diabetes mellitus.  Patient denies meds for this condition.  2. Other hyperlipidemia Discussed labs with patient today. 02/03/2022, triglycerides 614, likely due to increased wine intake.  Now 316.  LDL  110, HDL 52.  Patient is taking Lipitor 40 mg nightly.  3. Essential hypertension Discussed labs with patient today. Patient denies headache, chest pain, dizziness.  Patient is taking Norvasc, HCTZ, and Lopressor,.  4. Vitamin D deficiency New diagnosis.  Discussed labs with patient today. Positive for osteopenia, takes multivitamin Gummies, takes calcium, vitamin D, magnesium on its own.  Assessment/Plan:  No orders of the defined types were placed in this encounter.   There are no discontinued medications.   Meds ordered this encounter  Medications   Vitamin D, Cholecalciferol, 25 MCG (1000 UT) CAPS    Sig: 1000 IU vit D daily    Dispense:  60 capsule     1. Prediabetes Handout given on prediabetes and insulin resistance.  Long discussion with patient and offered goals affect blood sugar levels, hands cravings.  2. Other hyperlipidemia Continue medications of Lipitor per PCP.  Continue PNP with decrease saturated and trans fat, decrease fatty carbs, decrease ETOH to improve triglycerides.  3. Essential hypertension Blood pressure is slightly up today, but not terrible for patient's age.  However, past several office visits it has been over 140/90.  Recommend follow-up with PCP regarding medication management.  Continue PNP, decrease salt intake, and weight loss.  4. Vitamin D deficiency Add 1000 IU OTC vitamin D.  Recheck in 3 to 4 months.  Counseling on vitamin D deficiency discussed with patient at.  Bone density exam should be checked every 2 years.  Start- Vitamin D, Cholecalciferol, 25 MCG (1000 UT) CAPS; 1000 IU  vit D daily  Dispense: 60 capsule  5. Obesity,current BMI 30.8 Desiree Andrews is currently in the action stage of change. As such, her goal is to continue with weight loss efforts. She has agreed to the Category 2 Plan with breakfast option.  Exercise goals:  As is.  Behavioral modification strategies: decreasing simple carbohydrates, decreasing alcohol intake,  avoiding temptations, and planning for success.  Desiree Andrews has agreed to follow-up with our clinic in 2-3 weeks. She was informed of the importance of frequent follow-up visits to maximize her success with intensive lifestyle modifications for her multiple health conditions.   Objective:   Blood pressure (!) 143/75, pulse 72, temperature 98.1 F (36.7 C), height '5\' 2"'$  (1.575 m), weight 168 lb 9.6 oz (76.5 kg), SpO2 99 %. Body mass index is 30.84 kg/m.  General: Cooperative, alert, well developed, in no acute distress. HEENT: Conjunctivae and lids unremarkable. Cardiovascular: Regular rhythm.  Lungs: Normal work of breathing. Neurologic: No focal deficits.   Lab Results  Component Value Date   CREATININE 0.54 (L) 06/30/2022   BUN 14 06/30/2022   NA 141 06/30/2022   K 4.5 06/30/2022   CL 101 06/30/2022   CO2 22 06/30/2022   Lab Results  Component Value Date   ALT 28 06/30/2022   AST 26 06/30/2022   ALKPHOS 85 06/30/2022   BILITOT 0.4 06/30/2022   Lab Results  Component Value Date   HGBA1C 6.3 (H) 06/30/2022   HGBA1C 6.0 02/03/2022   HGBA1C 6.3 08/12/2021   HGBA1C 6.4 04/30/2021   HGBA1C 6.4 08/09/2020   Lab Results  Component Value Date   INSULIN 14.7 06/30/2022   Lab Results  Component Value Date   TSH 0.942 06/30/2022   Lab Results  Component Value Date   CHOL 216 (H) 06/30/2022   HDL 52 06/30/2022   LDLCALC 110 (H) 06/30/2022   LDLDIRECT 123.0 02/03/2022   TRIG 316 (H) 06/30/2022   CHOLHDL 5 02/03/2022   Lab Results  Component Value Date   VD25OH 42.5 06/30/2022   VD25OH 38.96 02/03/2022   VD25OH 26.81 (L) 04/30/2021   Lab Results  Component Value Date   WBC 6.0 06/30/2022   HGB 14.8 06/30/2022   HCT 43.7 06/30/2022   MCV 93 06/30/2022   PLT 251 06/30/2022   No results found for: "IRON", "TIBC", "FERRITIN"  Attestation Statements:   Reviewed by clinician on day of visit: allergies, medications, problem list, medical history, surgical history,  family history, social history, and previous encounter notes.  I, Davy Pique, RMA, am acting as Location manager for [employer], DO.  I have reviewed the above documentation for accuracy and completeness, and I agree with the above. Marjory Sneddon, D.O.  The [place] was signed into law in 2016 which includes the topic of electronic health records.  This provides immediate access to information in MyChart.  This includes consultation notes, operative notes, office notes, lab results and pathology reports.  If you have any questions about what you read please let us know at your next visit so we can discuss your concerns and take corrective action if need be.  We are right here with you.

## 2022-08-19 NOTE — Progress Notes (Signed)
Chief Complaint:   OBESITY Desiree Andrews is here to discuss her progress with her obesity treatment plan along with follow-up of her obesity related diagnoses. Desiree Andrews is on the Category 2 Plan and states she is following her eating plan approximately 85% of the time. Desiree Andrews states she is walking 10,000 steps 6 times per week.  Today's visit was #: 3 Starting weight: 170 lbs Starting date: 06/30/2022 Today's weight: 169 lbs Today's date: 08/03/2022 Total lbs lost to date: 1 lb Total lbs lost since last in-office visit: 0  Interim History: Desiree Andrews struggled to stay on plan with daughter's wedding and Thanksgiving. Working to get back on track with healthy prescribed nutrition plan.  Subjective:   1. Prediabetes A1c at 6.3 increased, insulin 14.7-06/30/22. Discussed starting Metformin--strong family history: T2DM. Risks/benefits and handouts for Metformin/IR/Pre-DM.  Patient agreeable to consider starting metformin at this time.  2. Essential hypertension Taking Lopressor 75 mg twice a day, HCTZ '25mg'$  daily and Norvasc 2.5 mg daily. Blood pressure not at goal.  Reports no side effects with medications.   Assessment/Plan:   1. Prediabetes Start Metformin 500 mg daily for 1 month with 0 refills.  -Start metFORMIN (GLUCOPHAGE) 500 MG tablet; Take 1 tablet (500 mg total) by mouth daily.  Dispense: 30 tablet; Refill: 0 Continue prescribed nutrition plan to decrease simple carbohydrates, decrease saturated fats, increase lean proteins and exercise to promote weight loss. 2. Essential hypertension Monitor closely. Continue prescribed nutrition plan to decrease simple carbohydrates, decrease saturated fats, increase lean proteins and exercise to promote weight loss.  3. Obesity,current BMI 31.0 Desiree Andrews is currently in the action stage of change. As such, her goal is to continue with weight loss efforts. She has agreed to the Category 2 Plan.   Exercise goals: As is.   Behavioral modification  strategies: increasing lean protein intake, decreasing simple carbohydrates, meal planning and cooking strategies, and holiday eating strategies .  Desiree Andrews has agreed to follow-up with our clinic in 4 weeks. She was informed of the importance of frequent follow-up visits to maximize her success with intensive lifestyle modifications for her multiple health conditions.   Objective:   Blood pressure (!) 148/85, pulse 67, temperature 97.7 F (36.5 C), height '5\' 2"'$  (1.575 m), weight 169 lb (76.7 kg), SpO2 96 %. Body mass index is 30.91 kg/m.  General: Cooperative, alert, well developed, in no acute distress. HEENT: Conjunctivae and lids unremarkable. Cardiovascular: Regular rhythm.  Lungs: Normal work of breathing. Neurologic: No focal deficits.   Lab Results  Component Value Date   CREATININE 0.54 (L) 06/30/2022   BUN 14 06/30/2022   NA 141 06/30/2022   K 4.5 06/30/2022   CL 101 06/30/2022   CO2 22 06/30/2022   Lab Results  Component Value Date   ALT 28 06/30/2022   AST 26 06/30/2022   ALKPHOS 85 06/30/2022   BILITOT 0.4 06/30/2022   Lab Results  Component Value Date   HGBA1C 6.3 (H) 06/30/2022   HGBA1C 6.0 02/03/2022   HGBA1C 6.3 08/12/2021   HGBA1C 6.4 04/30/2021   HGBA1C 6.4 08/09/2020   Lab Results  Component Value Date   INSULIN 14.7 06/30/2022   Lab Results  Component Value Date   TSH 0.942 06/30/2022   Lab Results  Component Value Date   CHOL 216 (H) 06/30/2022   HDL 52 06/30/2022   LDLCALC 110 (H) 06/30/2022   LDLDIRECT 123.0 02/03/2022   TRIG 316 (H) 06/30/2022   CHOLHDL 5 02/03/2022   Lab Results  Component Value Date   VD25OH 42.5 06/30/2022   VD25OH 38.96 02/03/2022   VD25OH 26.81 (L) 04/30/2021   Lab Results  Component Value Date   WBC 6.0 06/30/2022   HGB 14.8 06/30/2022   HCT 43.7 06/30/2022   MCV 93 06/30/2022   PLT 251 06/30/2022   No results found for: "IRON", "TIBC", "FERRITIN"  Attestation Statements:   Reviewed by clinician  on day of visit: allergies, medications, problem list, medical history, surgical history, family history, social history, and previous encounter notes.  I, Brendell Tyus, am acting as transcriptionist for [employer], PA.  I have reviewed the above documentation for accuracy and completeness, and I agree with the above. -  Shannette Tabares,PA-C

## 2022-08-25 ENCOUNTER — Ambulatory Visit (INDEPENDENT_AMBULATORY_CARE_PROVIDER_SITE_OTHER): Payer: Medicare Other | Admitting: Family Medicine

## 2022-08-25 ENCOUNTER — Encounter (INDEPENDENT_AMBULATORY_CARE_PROVIDER_SITE_OTHER): Payer: Self-pay | Admitting: Family Medicine

## 2022-08-25 VITALS — BP 149/88 | HR 78 | Temp 97.9°F | Ht 62.0 in | Wt 170.2 lb

## 2022-08-25 DIAGNOSIS — E669 Obesity, unspecified: Secondary | ICD-10-CM | POA: Diagnosis not present

## 2022-08-25 DIAGNOSIS — I10 Essential (primary) hypertension: Secondary | ICD-10-CM

## 2022-08-25 DIAGNOSIS — Z6831 Body mass index (BMI) 31.0-31.9, adult: Secondary | ICD-10-CM

## 2022-08-25 DIAGNOSIS — R7303 Prediabetes: Secondary | ICD-10-CM | POA: Diagnosis not present

## 2022-08-25 DIAGNOSIS — E7849 Other hyperlipidemia: Secondary | ICD-10-CM

## 2022-08-26 ENCOUNTER — Ambulatory Visit (INDEPENDENT_AMBULATORY_CARE_PROVIDER_SITE_OTHER): Payer: Medicare Other | Admitting: Physician Assistant

## 2022-08-31 DIAGNOSIS — K080 Exfoliation of teeth due to systemic causes: Secondary | ICD-10-CM | POA: Diagnosis not present

## 2022-09-03 ENCOUNTER — Other Ambulatory Visit (INDEPENDENT_AMBULATORY_CARE_PROVIDER_SITE_OTHER): Payer: Self-pay | Admitting: Physician Assistant

## 2022-09-03 DIAGNOSIS — R7303 Prediabetes: Secondary | ICD-10-CM

## 2022-09-08 NOTE — Progress Notes (Signed)
Chief Complaint:   OBESITY Desiree Andrews is here to discuss her progress with her obesity treatment plan along with follow-up of her obesity related diagnoses. Desiree Andrews is on the Category 2 Plan and states she is following her eating plan approximately 90% of the time. Desiree Andrews states she is walking 60 minutes at work 7 times per week.  Today's visit was #: 4 Starting weight: 170 LBS Starting date: 06/30/2022 Today's weight: 170 LBS Today's date: 08/25/2022 Total lbs lost to date: 0 Total lbs lost since last in-office visit: +1 LB  Interim History: When patient is at work she gets in 10,000 steps daily.  On weekends trying to walk 1 hour.  Patient plans to cut out ETOH intake and have a "dry January".  Subjective:   1. Other hyperlipidemia Patient is on Lipitor 40 mg daily, tolerating well, no side effects, but patient is not drinking much water.  She denies myalgias.  2. Prediabetes Metformin started last office visit, patient is tolerating well and has some decreased cravings.  Patient is eating less candy now and less alcohol intake.  Patient has occasional loose stools if she deviates off plan.  3. Essential hypertension Last office visit blood pressure was 148/85.  Today 149/88 which is elevated.  Patient is not taking blood pressure at home but has a monitor.  Patient is asymptomatic and no concerns.  She takes her medications.  Assessment/Plan:  No orders of the defined types were placed in this encounter.   There are no discontinued medications.   No orders of the defined types were placed in this encounter.    1. Other hyperlipidemia Continue Lipitor per PCP.  Reminded patient of importance of decreasing butter and fatty meat intake to improve LDL.  Last LDL was 110 and triglycerides were 316, (614 prior).  Continue to avoid alcohol.  2. Prediabetes Continue metformin at 500 mg daily.  No need for refill today per patient.  Continue PNP.  Medication counseling done and  discussed how various foods will affect cravings or increase chance of side effects.  3. Essential hypertension She would check blood pressure 2 to 3 days/week to goal of<140/90.  Continue Lopressor, Norvasc, HCTZ.  Decrease salt, continue exercise, increase water and decrease alcohol intake.  4. Obesity, current BMI 31.1 Mindful eating handouts given to patient today and strategies discussed with patient.  We added and lunch options.  Recommending to avoid liquid calories.  Desiree Andrews is currently in the action stage of change. As such, her goal is to continue with weight loss efforts. She has agreed to the Category 2 Plan with breakfast and lunch options.  Exercise goals:  As is.  Behavioral modification strategies: increasing lean protein intake, decreasing simple carbohydrates, and decreasing liquid calories.  Desiree Andrews has agreed to follow-up with our clinic in 3-4 weeks. She was informed of the importance of frequent follow-up visits to maximize her success with intensive lifestyle modifications for her multiple health conditions.   Objective:   Blood pressure (!) 149/88, pulse 78, temperature 97.9 F (36.6 C), height '5\' 2"'$  (1.575 m), weight 170 lb 3.2 oz (77.2 kg), SpO2 97 %. Body mass index is 31.13 kg/m.  General: Cooperative, alert, well developed, in no acute distress. HEENT: Conjunctivae and lids unremarkable. Cardiovascular: Regular rhythm.  Lungs: Normal work of breathing. Neurologic: No focal deficits.   Lab Results  Component Value Date   CREATININE 0.54 (L) 06/30/2022   BUN 14 06/30/2022   NA 141 06/30/2022   K  4.5 06/30/2022   CL 101 06/30/2022   CO2 22 06/30/2022   Lab Results  Component Value Date   ALT 28 06/30/2022   AST 26 06/30/2022   ALKPHOS 85 06/30/2022   BILITOT 0.4 06/30/2022   Lab Results  Component Value Date   HGBA1C 6.3 (H) 06/30/2022   HGBA1C 6.0 02/03/2022   HGBA1C 6.3 08/12/2021   HGBA1C 6.4 04/30/2021   HGBA1C 6.4 08/09/2020   Lab  Results  Component Value Date   INSULIN 14.7 06/30/2022   Lab Results  Component Value Date   TSH 0.942 06/30/2022   Lab Results  Component Value Date   CHOL 216 (H) 06/30/2022   HDL 52 06/30/2022   LDLCALC 110 (H) 06/30/2022   LDLDIRECT 123.0 02/03/2022   TRIG 316 (H) 06/30/2022   CHOLHDL 5 02/03/2022   Lab Results  Component Value Date   VD25OH 42.5 06/30/2022   VD25OH 38.96 02/03/2022   VD25OH 26.81 (L) 04/30/2021   Lab Results  Component Value Date   WBC 6.0 06/30/2022   HGB 14.8 06/30/2022   HCT 43.7 06/30/2022   MCV 93 06/30/2022   PLT 251 06/30/2022   No results found for: "IRON", "TIBC", "FERRITIN"  Attestation Statements:   Reviewed by clinician on day of visit: allergies, medications, problem list, medical history, surgical history, family history, social history, and previous encounter notes.  I, Davy Pique, RMA, am acting as Location manager for [employer], DO.   I have reviewed the above documentation for accuracy and completeness, and I agree with the above. Marjory Sneddon, D.O.  The [place] was signed into law in 2016 which includes the topic of electronic health records.  This provides immediate access to information in MyChart.  This includes consultation notes, operative notes, office notes, lab results and pathology reports.  If you have any questions about what you read please let us know at your next visit so we can discuss your concerns and take corrective action if need be.  We are right here with you.

## 2022-09-15 DIAGNOSIS — K080 Exfoliation of teeth due to systemic causes: Secondary | ICD-10-CM | POA: Diagnosis not present

## 2022-09-17 ENCOUNTER — Ambulatory Visit
Admission: RE | Admit: 2022-09-17 | Discharge: 2022-09-17 | Disposition: A | Discharge status: Home / Self Care | Payer: Medicare Other | Source: Ambulatory Visit | Attending: Internal Medicine | Admitting: Internal Medicine

## 2022-09-17 DIAGNOSIS — Z1231 Encounter for screening mammogram for malignant neoplasm of breast: Secondary | ICD-10-CM | POA: Diagnosis not present

## 2022-09-23 ENCOUNTER — Encounter (INDEPENDENT_AMBULATORY_CARE_PROVIDER_SITE_OTHER): Payer: Self-pay | Admitting: Family Medicine

## 2022-09-23 ENCOUNTER — Ambulatory Visit (INDEPENDENT_AMBULATORY_CARE_PROVIDER_SITE_OTHER): Payer: Medicare Other | Admitting: Family Medicine

## 2022-09-23 ENCOUNTER — Encounter (INDEPENDENT_AMBULATORY_CARE_PROVIDER_SITE_OTHER): Payer: Self-pay

## 2022-09-23 VITALS — Ht 62.0 in

## 2022-09-23 DIAGNOSIS — Z91199 Patient's noncompliance with other medical treatment and regimen due to unspecified reason: Secondary | ICD-10-CM

## 2022-09-24 ENCOUNTER — Ambulatory Visit (INDEPENDENT_AMBULATORY_CARE_PROVIDER_SITE_OTHER): Payer: Medicare Other | Admitting: Family Medicine

## 2022-09-24 ENCOUNTER — Other Ambulatory Visit (INDEPENDENT_AMBULATORY_CARE_PROVIDER_SITE_OTHER): Payer: Self-pay | Admitting: Family Medicine

## 2022-09-24 ENCOUNTER — Encounter (INDEPENDENT_AMBULATORY_CARE_PROVIDER_SITE_OTHER): Payer: Self-pay | Admitting: Family Medicine

## 2022-09-24 VITALS — BP 138/82 | HR 71 | Temp 97.7°F | Ht 62.0 in | Wt 165.6 lb

## 2022-09-24 DIAGNOSIS — R7303 Prediabetes: Secondary | ICD-10-CM

## 2022-09-24 DIAGNOSIS — I10 Essential (primary) hypertension: Secondary | ICD-10-CM

## 2022-09-24 DIAGNOSIS — E669 Obesity, unspecified: Secondary | ICD-10-CM

## 2022-09-24 DIAGNOSIS — Z6830 Body mass index (BMI) 30.0-30.9, adult: Secondary | ICD-10-CM

## 2022-09-24 DIAGNOSIS — E559 Vitamin D deficiency, unspecified: Secondary | ICD-10-CM

## 2022-09-24 MED ORDER — METFORMIN HCL 500 MG PO TABS: 500.0000 mg | ORAL_TABLET | Freq: Every day | ORAL | 0 refills | Status: DC

## 2022-10-14 NOTE — Progress Notes (Signed)
Chief Complaint:   OBESITY Desiree Andrews is here to discuss her progress with her obesity treatment plan along with follow-up of her obesity related diagnoses. Desiree Andrews is on the Category 2 Plan with breakfast and lunch options and states she is following her eating plan approximately 70% of the time. Desiree Andrews states she is walking throughout the day 6 times per week.  Today's visit was #: 5 Starting weight: 170 LBS Starting date: 06/30/2022 Today's weight: 165 LBS Today's date: 09/24/2022 Total lbs lost to date: 5 LBS Total lbs lost since last in-office visit: 5 LBS  Interim History: Desiree Andrews is here for a follow up office visit.  We reviewed her meal plan and all questions were answered.  Patient's food recall appears to be accurate and consistent with what is on plan when she is following it.   When eating on plan, her hunger and cravings are well controlled.  Patient lost 4.4 LBS of fat mass since 08/25/2022 and is essentially maintained in muscle mass.  Patient is following plan more closely despite increased social events.  Subjective:   1. Prediabetes Not exercising.  Patient at one time had loose stools when eating off plan, but otherwise tolerating well.  Decreased cravings for chocolate and decrease hunger.  2. Essential hypertension Refill last office visit blood pressure was 149/88 and better today.  Blood pressure at home is 140s/80s in the a.m. before medications and in the p.m. 130s/80s.  3. Vitamin D deficiency Patient takes for 1000 IU daily OTC.  Patient denies issues or concerns.  Last vitamin D level was 42.5 on 06/30/2022.  Assessment/Plan:   Medications Discontinued During This Encounter  Medication Reason   metFORMIN (GLUCOPHAGE) 500 MG tablet Reorder     Meds ordered this encounter  Medications   DISCONTD: metFORMIN (GLUCOPHAGE) 500 MG tablet    Sig: Take 1 tablet (500 mg total) by mouth daily.    Dispense:  30 tablet    Refill:  0     1.  Prediabetes Continue metformin daily.  Continue PNP and mindful eating.  Strategies for social gatherings discussed with patient.  A1c on 06/30/2022 was 6.3 and consider recheck at next office visit.  Refill- metFORMIN (GLUCOPHAGE) 500 MG tablet; Take 1 tablet (500 mg total) by mouth daily.  Dispense: 30 tablet; Refill: 0  2. Essential hypertension Continue Norvasc, HCTZ, Lopressor.  Consider adjusting medication timing or dosing in the future if blood pressure remains above [id number] systolic even with weight loss and lifestyle changes.  3. Vitamin D deficiency Consider recheck of vitamin D and labs at next office visit if not done by PCP.  Continue to use OTC doses.  4. Obesity, current BMI 30.3-starting bmi 31.09 Patient's goal is 60 ounces of water per day.  Continue mindful eating practices.  Continue to walk 10,000-15,000 steps per day continue to watch alcohol intake and liquid calories.  Desiree Andrews is currently in the action stage of change. As such, her goal is to continue with weight loss efforts. She has agreed to the Category 2 Plan with breakfast and lunch options.  Exercise goals:  As is.  Behavioral modification strategies: celebration eating strategies.  Desiree Andrews has agreed to follow-up with our clinic in 4 weeks. She was informed of the importance of frequent follow-up visits to maximize her success with intensive lifestyle modifications for her multiple health conditions.   Objective:   Blood pressure 138/82, pulse 71, temperature 97.7 F (36.5 C), height '5\' 2"'$  (1.575 m),  weight 165 lb 9.6 oz (75.1 kg), SpO2 95 %. Body mass index is 30.29 kg/m.  General: Cooperative, alert, well developed, in no acute distress. HEENT: Conjunctivae and lids unremarkable. Cardiovascular: Regular rhythm.  Lungs: Normal work of breathing. Neurologic: No focal deficits.   Lab Results  Component Value Date   CREATININE 0.54 (L) 06/30/2022   BUN 14 06/30/2022   NA 141 06/30/2022   K 4.5 06/30/2022    CL 101 06/30/2022   CO2 22 06/30/2022   Lab Results  Component Value Date   ALT 28 06/30/2022   AST 26 06/30/2022   ALKPHOS 85 06/30/2022   BILITOT 0.4 06/30/2022   Lab Results  Component Value Date   HGBA1C 6.3 (H) 06/30/2022   HGBA1C 6.0 02/03/2022   HGBA1C 6.3 08/12/2021   HGBA1C 6.4 04/30/2021   HGBA1C 6.4 08/09/2020   Lab Results  Component Value Date   INSULIN 14.7 06/30/2022   Lab Results  Component Value Date   TSH 0.942 06/30/2022   Lab Results  Component Value Date   CHOL 216 (H) 06/30/2022   HDL 52 06/30/2022   LDLCALC 110 (H) 06/30/2022   LDLDIRECT 123.0 02/03/2022   TRIG 316 (H) 06/30/2022   CHOLHDL 5 02/03/2022   Lab Results  Component Value Date   VD25OH 42.5 06/30/2022   VD25OH 38.96 02/03/2022   VD25OH 26.81 (L) 04/30/2021   Lab Results  Component Value Date   WBC 6.0 06/30/2022   HGB 14.8 06/30/2022   HCT 43.7 06/30/2022   MCV 93 06/30/2022   PLT 251 06/30/2022   No results found for: "IRON", "TIBC", "FERRITIN"  Attestation Statements:   Reviewed by clinician on day of visit: allergies, medications, problem list, medical history, surgical history, family history, social history, and previous encounter notes.  I, Davy Pique, RMA, am acting as Location manager for [employer], DO.  I have reviewed the above documentation for completeness, and I agree with the above. Desiree Andrews, D.O.  The [place] was signed into law in 2016 which includes the topic of electronic health records.  This provides immediate access to information in MyChart.  This includes consultation notes, operative notes, office notes, lab results and pathology reports.  If you have any questions about what you read please let us know at your next visit so we can discuss your concerns and take corrective action if need be.  We are right here with you.

## 2022-10-15 ENCOUNTER — Ambulatory Visit (INDEPENDENT_AMBULATORY_CARE_PROVIDER_SITE_OTHER): Payer: Medicare Other | Admitting: Family Medicine

## 2022-10-21 ENCOUNTER — Other Ambulatory Visit (INDEPENDENT_AMBULATORY_CARE_PROVIDER_SITE_OTHER): Payer: Self-pay | Admitting: Family Medicine

## 2022-10-21 ENCOUNTER — Ambulatory Visit (INDEPENDENT_AMBULATORY_CARE_PROVIDER_SITE_OTHER): Payer: Medicare Other | Admitting: Family Medicine

## 2022-10-21 ENCOUNTER — Encounter (INDEPENDENT_AMBULATORY_CARE_PROVIDER_SITE_OTHER): Payer: Self-pay | Admitting: Family Medicine

## 2022-10-21 VITALS — BP 141/86 | HR 85 | Temp 97.7°F | Ht 62.0 in | Wt 165.0 lb

## 2022-10-21 DIAGNOSIS — E669 Obesity, unspecified: Secondary | ICD-10-CM | POA: Diagnosis not present

## 2022-10-21 DIAGNOSIS — R7303 Prediabetes: Secondary | ICD-10-CM | POA: Diagnosis not present

## 2022-10-21 DIAGNOSIS — Z6830 Body mass index (BMI) 30.0-30.9, adult: Secondary | ICD-10-CM

## 2022-10-21 DIAGNOSIS — I10 Essential (primary) hypertension: Secondary | ICD-10-CM | POA: Diagnosis not present

## 2022-10-21 MED ORDER — METFORMIN HCL 500 MG PO TABS: 500.0000 mg | ORAL_TABLET | Freq: Two times a day (BID) | ORAL | 0 refills | Status: DC

## 2022-10-21 NOTE — Assessment & Plan Note (Addendum)
BP elevated today She has not yet taken her medicine today Denies headache or chest pain  Continue metoprolol 75 mg twice daily, amlodipine 2.5 mg daily, HCTZ 25 mg daily as directed. Monitor home blood pressure readings 1-2 times a week at rest and bring in log next visit.

## 2022-10-21 NOTE — Assessment & Plan Note (Addendum)
Had one incident of diarrhea on metformin 500 mg once a day. Denies abdominal pain. Sugar cravings have reduced + fam hx of type II diabetes Actively working on prescribed meal plan which is low in added sugar. Has room for improvement with more regular exercise.  Will increase metformin to 500 mg twice daily.  Her last A1c 06/30/2022 was 6.3.  Look for improvements by rechecking A1c, chemistry panel, B12 in the next month.  Work on increasing walking time with a goal of 30 minutes 5 days a week.

## 2022-10-21 NOTE — Progress Notes (Signed)
Office: [phone]  /  Fax: [phone]  WEIGHT SUMMARY AND BIOMETRICS  Vitals Temp: 97.7 F (36.5 C) BP: (!) 141/86 Pulse Rate: 85 SpO2: 97 %   Anthropometric Measurements Height: '5\' 2"'$  (1.575 m) Weight: 165 lb (74.8 kg) BMI (Calculated): 30.17 Weight at Last Visit: 165lb Weight Lost Since Last Visit: 0 Starting Weight: 170lb Total Weight Loss (lbs): 5 lb (2.268 kg)   Body Composition  Body Fat %: 40.9 % Fat Mass (lbs): 67.6 lbs Muscle Mass (lbs): 92.8 lbs Total Body Water (lbs): 64.6 lbs Visceral Fat Rating : 11   Other Clinical Data Fasting: no Labs: no Today's Visit #: 6 Starting Date: 06/30/22     HPI  Chief Complaint: OBESITY  Desiree Andrews is here to discuss her progress with her obesity treatment plan. She is on the the Category 2 Plan and states she is following her eating plan approximately 85 % of the time. She states she is exercising 5-7 minutes 60 times per week.   Interval History:  Since last office visit she is the same weight as last visit She had some celebrations, meals out and some alcohol Drinks 2 glasses of [employer] fat free milk Denies big portion sizes Eating out less often Gets 10,000 steps/ day at work Bring berries to work Chocolate has been at work but she is trying not to eat it Metformin has reduced her sugar cravings  Pharmacotherapy: metformin  PHYSICAL EXAM:  Blood pressure (!) 141/86, pulse 85, temperature 97.7 F (36.5 C), height '5\' 2"'$  (1.575 m), weight 165 lb (74.8 kg), SpO2 97 %. Body mass index is 30.18 kg/m.  General: She is overweight, cooperative, alert, well developed, and in no acute distress. PSYCH: Has normal mood, affect and thought process.   Lungs: Normal breathing effort, no conversational dyspnea.  DIAGNOSTIC DATA REVIEWED:  BMET    Component Value Date/Time   NA 141 06/30/2022 1229   K 4.5 06/30/2022 1229   CL 101 06/30/2022 1229   CO2 22 06/30/2022 1229   GLUCOSE 110 (H) 06/30/2022 1229    GLUCOSE 98 02/03/2022 1247   BUN 14 06/30/2022 1229   CREATININE 0.54 (L) 06/30/2022 1229   CALCIUM 9.7 06/30/2022 1229   GFRNONAA >60 08/17/2019 1027   GFRAA >60 08/17/2019 1027   Lab Results  Component Value Date   HGBA1C 6.3 (H) 06/30/2022   HGBA1C 6.1 05/01/2009   Lab Results  Component Value Date   INSULIN 14.7 06/30/2022   Lab Results  Component Value Date   TSH 0.942 06/30/2022   CBC    Component Value Date/Time   WBC 6.0 06/30/2022 1229   WBC 6.1 08/12/2021 0757   RBC 4.71 06/30/2022 1229   RBC 4.63 08/12/2021 0757   HGB 14.8 06/30/2022 1229   HGB 15.1 06/12/2010 1439   HCT 43.7 06/30/2022 1229   HCT 43.2 06/12/2010 1439   PLT 251 06/30/2022 1229   MCV 93 06/30/2022 1229   MCV 94.5 06/12/2010 1439   MCH 31.4 06/30/2022 1229   MCH 31.7 08/17/2019 1027   MCHC 33.9 06/30/2022 1229   MCHC 34.1 08/12/2021 0757   RDW 12.1 06/30/2022 1229   RDW 12.6 06/12/2010 1439   Iron Studies No results found for: "IRON", "TIBC", "FERRITIN", "IRONPCTSAT" Lipid Panel     Component Value Date/Time   CHOL 216 (H) 06/30/2022 1229   TRIG 316 (H) 06/30/2022 1229   HDL 52 06/30/2022 1229   CHOLHDL 5 02/03/2022 1247   VLDL 70.0 (H) 08/12/2021  0757   LDLCALC 110 (H) 06/30/2022 1229   LDLDIRECT 123.0 02/03/2022 1247   Hepatic Function Panel     Component Value Date/Time   PROT 6.9 06/30/2022 1229   ALBUMIN 4.6 06/30/2022 1229   AST 26 06/30/2022 1229   ALT 28 06/30/2022 1229   ALKPHOS 85 06/30/2022 1229   BILITOT 0.4 06/30/2022 1229   BILIDIR 0.0 09/27/2013 0822      Component Value Date/Time   TSH 0.942 06/30/2022 1229   Nutritional Lab Results  Component Value Date   VD25OH 42.5 06/30/2022   VD25OH 38.96 02/03/2022   VD25OH 26.81 (L) 04/30/2021     ASSESSMENT AND PLAN  TREATMENT PLAN FOR OBESITY:  Recommended Dietary Goals  Desiree Andrews is currently in the action stage of change. As such, her goal is to continue weight management plan. She has agreed to  the Category 2 Plan.  Behavioral Intervention  We discussed the following Behavioral Modification Strategies today: increasing lean protein intake, increasing vegetables, increasing fiber rich foods, avoiding skipping meals, increasing water intake, and work on meal planning and easy cooking plans.  Additional resources provided today: NA  Recommended Physical Activity Goals  Desiree Andrews has been advised to work up to 150 minutes of moderate intensity aerobic activity a week and strengthening exercises 2-3 times per week for cardiovascular health, weight loss maintenance and preservation of muscle mass.   She has agreed to increase physical activity in their day and reduce sedentary time (increase NEAT).  and Will begin resistance exercise 15 minutes, 2-3 times per week. Chosen activity body weight training, resistance bands or free weights.   Pharmacotherapy We discussed various medication options to help Desiree Andrews with her weight loss efforts and we both agreed to metformin.  ASSOCIATED CONDITIONS ADDRESSED TODAY  Prediabetes Assessment & Plan: Had one incident of diarrhea on metformin 500 mg once a day. Denies abdominal pain. Sugar cravings have reduced + fam hx of type II diabetes Actively working on prescribed meal plan which is low in added sugar. Has room for improvement with more regular exercise.  Will increase metformin to 500 mg twice daily.  Her last A1c 06/30/2022 was 6.3.  Look for improvements by rechecking A1c, chemistry panel, B12 in the next month.  Work on increasing walking time with a goal of 30 minutes 5 days a week.  Orders: -     metFORMIN HCl; Take 1 tablet (500 mg total) by mouth 2 (two) times daily with a meal.  Dispense: 60 tablet; Refill: 0  Generalized obesity  BMI 30.0-30.9,adult  Essential hypertension Assessment & Plan: BP elevated today She has not yet taken her medicine today Denies headache or chest pain  Continue metoprolol 75 mg twice daily,  amlodipine 2.5 mg daily, HCTZ 25 mg daily as directed. Monitor home blood pressure readings 1-2 times a week at rest and bring in log next visit.       No follow-ups on file.Marland Kitchen She was informed of the importance of frequent follow up visits to maximize her success with intensive lifestyle modifications for her multiple health conditions.   ATTESTASTION STATEMENTS:  Reviewed by clinician on day of visit: allergies, medications, problem list, medical history, surgical history, family history, social history, and previous encounter notes.   I have personally spent 30 minutes total time today in preparation, patient care, nutritional counseling and documentation for this visit, including the following: review of clinical lab tests; review of medical tests/procedures/services.      Dell Ponto, DO

## 2022-10-26 NOTE — Progress Notes (Signed)
Pt was not seen today by me or my CMA

## 2022-11-10 ENCOUNTER — Telehealth (INDEPENDENT_AMBULATORY_CARE_PROVIDER_SITE_OTHER): Payer: Self-pay | Admitting: Family Medicine

## 2022-11-10 NOTE — Telephone Encounter (Signed)
Mrs. Desiree Andrews( [id number])  has a NO SHOW fee on her account for 1/31. she states that she left because the clinic was running be hide and she had an another appt to get to. are we able to fix that for her?

## 2022-11-12 ENCOUNTER — Ambulatory Visit (INDEPENDENT_AMBULATORY_CARE_PROVIDER_SITE_OTHER): Payer: Medicare Other | Admitting: Family Medicine

## 2022-11-12 ENCOUNTER — Other Ambulatory Visit (INDEPENDENT_AMBULATORY_CARE_PROVIDER_SITE_OTHER): Payer: Self-pay | Admitting: Family Medicine

## 2022-11-12 ENCOUNTER — Encounter (INDEPENDENT_AMBULATORY_CARE_PROVIDER_SITE_OTHER): Payer: Self-pay | Admitting: Family Medicine

## 2022-11-12 VITALS — BP 125/84 | HR 83 | Temp 98.4°F | Ht 62.0 in | Wt 165.0 lb

## 2022-11-12 DIAGNOSIS — E559 Vitamin D deficiency, unspecified: Secondary | ICD-10-CM | POA: Diagnosis not present

## 2022-11-12 DIAGNOSIS — R7303 Prediabetes: Secondary | ICD-10-CM | POA: Diagnosis not present

## 2022-11-12 DIAGNOSIS — Z6830 Body mass index (BMI) 30.0-30.9, adult: Secondary | ICD-10-CM | POA: Insufficient documentation

## 2022-11-12 DIAGNOSIS — J3089 Other allergic rhinitis: Secondary | ICD-10-CM | POA: Diagnosis not present

## 2022-11-12 DIAGNOSIS — E669 Obesity, unspecified: Secondary | ICD-10-CM | POA: Insufficient documentation

## 2022-11-12 MED ORDER — LEVOCETIRIZINE DIHYDROCHLORIDE 5 MG PO TABS: 5.0000 mg | ORAL_TABLET | Freq: Every evening | ORAL | 0 refills | Status: DC

## 2022-11-12 MED ORDER — VITAMIN D (CHOLECALCIFEROL) 25 MCG (1000 UT) PO CAPS: ORAL_CAPSULE | ORAL | Status: DC

## 2022-11-12 NOTE — Telephone Encounter (Addendum)
I have sent billing an email to void.   I have notified the patient.

## 2022-11-12 NOTE — Progress Notes (Signed)
**Note Desiree-Identified via Obfuscation** Desiree Andrews, D.O.  ABFM, ABOM Specializing in Clinical Bariatric Medicine  Office located at: [address], [place]  [address]     Assessment and Plan:   Orders Placed This Encounter  Procedures   VITAMIN D 25 Hydroxy (Vit-D Deficiency, Fractures)   Hemoglobin A1c    Medications Discontinued During This Encounter  Medication Reason   cetirizine (ZYRTEC) 10 MG tablet Change in therapy   Vitamin D, Cholecalciferol, 25 MCG (1000 UT) CAPS Reorder     Meds ordered this encounter  Medications   Vitamin D, Cholecalciferol, 25 MCG (1000 UT) CAPS    Sig: 1000 IU vit D daily    Dispense:  60 capsule   levocetirizine (XYZAL) 5 MG tablet    Sig: Take 1 tablet (5 mg total) by mouth every evening.    Dispense:  30 tablet    Refill:  0     Prediabetes Assessment: Condition is Not optimized. Labs were reviewed. Lab Results  Component Value Date   HGBA1C 6.3 (H) 06/30/2022   HGBA1C 6.0 02/03/2022   HGBA1C 6.3 08/12/2021   INSULIN 14.7 06/30/2022  She has been compliant with Metformin 500 mg BID. She states that her hunger and cravings have improved. She reports having tolerable loose stools occasionally.   Plan: Recheck A1c today. Continue with Metformin 500 mg BID, patient declines a decrease in dose. -I counseled the patient that her loose stools are likely due to skipping meals and eating off plan. - Desiree Andrews will continue to work on weight loss, exercise, via their meal plan we devised to help decrease the risk of progressing to diabetes.    Vitamin D deficiency Assessment: Condition is Improving, but not optimized.. Labs were reviewed.  Lab Results  Component Value Date   VD25OH 42.5 06/30/2022   VD25OH 38.96 02/03/2022   VD25OH 26.81 (L) 04/30/2021  She reports good compliance and tolerance with OTC Cholecalciferol 1K IU daily. No concerns.  Plan: Recheck today. Continue with OTC Cholecalciferol 1K IU daily.  - weight loss will likely improve  availability of vitamin D, thus encouraged Desiree Andrews to continue with meal plan and their weight loss efforts to further improve this condition.  Thus, we will need to monitor levels regularly (every 3-4 mo on average) to keep levels within normal limits and prevent over supplementation.   Environmental and Seasonal Allergies Assessment: Condition is Not optimized and worse this time of year. She is compliant with Zyrtec 10 mg daily. She reports having runny eyes, nasal congestion, and slight coughing. Her friend is on [place] and asked about alternative treatments  Plan: Discontinue Zyrtec and begin Xyzal We also discussed the possibility of using Singulair in the future for her  allergies. I also informed her that using sinus rinses can improve allergy symptoms, especially after prolonged exposure.     TREATMENT PLAN FOR OBESITY: Generalized obesity BMI 30.0-30.9,adult Assessment: Condition is Improving, but not optimized.. Biometric data collected today, was reviewed with patient.  Fat mass has decreased by 2lb. Muscle mass has increased by 1.6lb. Total body water has increased by 1lb.   Plan: Continue with Category 2 meal plan.    Behavioral Intervention Additional resources provided today: patient declined Evidence-based interventions for health behavior change were utilized today including the discussion of self monitoring techniques, problem-solving barriers and SMART goal setting techniques.   Regarding patient's less desirable eating habits and patterns, we employed the technique of small changes.  Pt will specifically work on: continuing category  2 meal plan for next visit.    Recommended Physical Activity Goals Desiree Andrews has been advised to work up to 150 minutes of moderate intensity aerobic activity a week and strengthening exercises 2-3 times per week for cardiovascular health, weight loss maintenance and preservation of muscle mass.  She has agreed to Continue current level of  physical activity    FOLLOW UP: Return in about 3 weeks (around 12/03/2022). She was informed of the importance of frequent follow up visits to maximize her success with intensive lifestyle modifications for her multiple health conditions. Desiree Andrews is aware that we will review all of her lab results at our next visit.  She is aware that if anything is critical/ life threatening with the results, we will be contacting her via [facility] prior to the office visit to discuss management.    Subjective:   Chief complaint: Obesity Desiree Andrews is here to discuss her progress with her obesity treatment plan. She is on the the Category 2 Plan and states she is following her eating plan approximately 50% of the time. She states she is exercising 60 minutes 5 days per week.  Interval History:  Desiree Andrews is here for a follow up office visit. We reviewed her meal plan and all questions were answered. Patient's food recall appears to be accurate and consistent with what is on plan when she is following it. When eating on plan, her hunger and cravings are well controlled.     Review of Systems:  Pertinent positives were addressed with patient today.  Weight Summary and Biometrics   Weight Lost Since Last Visit: 0  Weight Gained Since Last Visit: 0   Vitals Temp: 98.4 F (36.9 C) BP: 125/84 Pulse Rate: 83 SpO2: 96 %   Anthropometric Measurements Height: 5\' 2"  (1.575 m) Weight: 165 lb (74.8 kg) BMI (Calculated): 30.17 Weight at Last Visit: 165LB Weight Lost Since Last Visit: 0 Weight Gained Since Last Visit: 0 Starting Weight: 170LB Total Weight Loss (lbs): 5 lb (2.268 kg) Peak Weight: 174lb   Body Composition  Body Fat %: 39.8 % Fat Mass (lbs): 65.6 lbs Muscle Mass (lbs): 94.4 lbs Total Body Water (lbs): 65.6 lbs Visceral Fat Rating : 11   Other Clinical Data Fasting: no Labs: no Today's Visit #: 7 Starting Date: 06/30/22    Objective:   PHYSICAL  EXAM:  Blood pressure 125/84, pulse 83, temperature 98.4 F (36.9 C), height 5\' 2"  (1.575 m), weight 165 lb (74.8 kg), SpO2 96 %. Body mass index is 30.18 kg/m.  General: Well Developed, well nourished, and in no acute distress.  HEENT: Normocephalic, atraumatic Skin: Warm and dry, cap RF less 2 sec, good turgor Chest:  Normal excursion, shape, no gross abn Respiratory: speaking in full sentences, no conversational dyspnea NeuroM-Sk: Ambulates w/o assistance, moves * 4 Psych: A and O *3, insight good, mood-full  DIAGNOSTIC DATA REVIEWED:  BMET    Component Value Date/Time   NA 141 06/30/2022 1229   K 4.5 06/30/2022 1229   CL 101 06/30/2022 1229   CO2 22 06/30/2022 1229   GLUCOSE 110 (H) 06/30/2022 1229   GLUCOSE 98 02/03/2022 1247   BUN 14 06/30/2022 1229   CREATININE 0.54 (L) 06/30/2022 1229   CALCIUM 9.7 06/30/2022 1229   GFRNONAA >60 08/17/2019 1027   GFRAA >60 08/17/2019 1027   Lab Results  Component Value Date   HGBA1C 6.3 (H) 06/30/2022   HGBA1C 6.1 05/01/2009   Lab Results  Component Value Date  INSULIN 14.7 06/30/2022   Lab Results  Component Value Date   TSH 0.942 06/30/2022   CBC    Component Value Date/Time   WBC 6.0 06/30/2022 1229   WBC 6.1 08/12/2021 0757   RBC 4.71 06/30/2022 1229   RBC 4.63 08/12/2021 0757   HGB 14.8 06/30/2022 1229   HGB 15.1 06/12/2010 1439   HCT 43.7 06/30/2022 1229   HCT 43.2 06/12/2010 1439   PLT 251 06/30/2022 1229   MCV 93 06/30/2022 1229   MCV 94.5 06/12/2010 1439   MCH 31.4 06/30/2022 1229   MCH 31.7 08/17/2019 1027   MCHC 33.9 06/30/2022 1229   MCHC 34.1 08/12/2021 0757   RDW 12.1 06/30/2022 1229   RDW 12.6 06/12/2010 1439   Iron Studies No results found for: "IRON", "TIBC", "FERRITIN", "IRONPCTSAT" Lipid Panel     Component Value Date/Time   CHOL 216 (H) 06/30/2022 1229   TRIG 316 (H) 06/30/2022 1229   HDL 52 06/30/2022 1229   CHOLHDL 5 02/03/2022 1247   VLDL 70.0 (H) 08/12/2021 0757   LDLCALC 110  (H) 06/30/2022 1229   LDLDIRECT 123.0 02/03/2022 1247   Hepatic Function Panel     Component Value Date/Time   PROT 6.9 06/30/2022 1229   ALBUMIN 4.6 06/30/2022 1229   AST 26 06/30/2022 1229   ALT 28 06/30/2022 1229   ALKPHOS 85 06/30/2022 1229   BILITOT 0.4 06/30/2022 1229   BILIDIR 0.0 09/27/2013 0822      Component Value Date/Time   TSH 0.942 06/30/2022 1229   Nutritional Lab Results  Component Value Date   VD25OH 42.5 06/30/2022   VD25OH 38.96 02/03/2022   VD25OH 26.81 (L) 04/30/2021    Attestations:   Reviewed by clinician on day of visit: allergies, medications, problem list, medical history, surgical history, family history, social history, and previous encounter notes.    I,Special Puri,acting as a Education administrator for [employer], DO.,have documented all relevant documentation on the behalf of Mellody Dance, DO,as directed by  Mellody Dance, DO while in the presence of Mellody Dance, DO.   I, Mellody Dance, DO, have reviewed all documentation for this visit. The documentation on 11/12/22 for the exam, diagnosis, procedures, and orders are all accurate and complete.

## 2022-11-13 LAB — HEMOGLOBIN A1C
Est. average glucose Bld gHb Est-mCnc: 134 mg/dL
Hgb A1c MFr Bld: 6.3 % — ABNORMAL HIGH (ref 4.8–5.6)

## 2022-11-13 LAB — VITAMIN D 25 HYDROXY (VIT D DEFICIENCY, FRACTURES): Vit D, 25-Hydroxy: 48.2 ng/mL (ref 30.0–100.0)

## 2022-11-17 ENCOUNTER — Telehealth: Payer: Self-pay | Admitting: Internal Medicine

## 2022-11-17 NOTE — Telephone Encounter (Signed)
Called patient to schedule Medicare Annual Wellness Visit (AWV). Left message for patient to call back and schedule Medicare Annual Wellness Visit (AWV).  Last date of AWV: awvi 03/24/20 per [facility]   Please schedule an appointment at any time with [facility].  If any questions, please contact me at [phone].  Thank you ,  [employer] [employer] AWV direct phone # [phone]

## 2022-12-07 ENCOUNTER — Ambulatory Visit (INDEPENDENT_AMBULATORY_CARE_PROVIDER_SITE_OTHER): Payer: Medicare Other | Admitting: Family Medicine

## 2022-12-14 ENCOUNTER — Other Ambulatory Visit (INDEPENDENT_AMBULATORY_CARE_PROVIDER_SITE_OTHER): Payer: Self-pay | Admitting: Family Medicine

## 2022-12-14 DIAGNOSIS — J3089 Other allergic rhinitis: Secondary | ICD-10-CM

## 2022-12-21 ENCOUNTER — Other Ambulatory Visit: Payer: Self-pay | Admitting: Internal Medicine

## 2022-12-29 ENCOUNTER — Other Ambulatory Visit (INDEPENDENT_AMBULATORY_CARE_PROVIDER_SITE_OTHER): Payer: Self-pay | Admitting: Family Medicine

## 2022-12-29 ENCOUNTER — Encounter (INDEPENDENT_AMBULATORY_CARE_PROVIDER_SITE_OTHER): Payer: Self-pay | Admitting: Family Medicine

## 2022-12-29 ENCOUNTER — Ambulatory Visit (INDEPENDENT_AMBULATORY_CARE_PROVIDER_SITE_OTHER): Payer: Medicare Other | Admitting: Family Medicine

## 2022-12-29 VITALS — BP 137/84 | HR 109 | Ht 62.0 in | Wt 163.0 lb

## 2022-12-29 DIAGNOSIS — Z6829 Body mass index (BMI) 29.0-29.9, adult: Secondary | ICD-10-CM

## 2022-12-29 DIAGNOSIS — J3089 Other allergic rhinitis: Secondary | ICD-10-CM | POA: Diagnosis not present

## 2022-12-29 DIAGNOSIS — Z6830 Body mass index (BMI) 30.0-30.9, adult: Secondary | ICD-10-CM

## 2022-12-29 DIAGNOSIS — R7303 Prediabetes: Secondary | ICD-10-CM

## 2022-12-29 DIAGNOSIS — E669 Obesity, unspecified: Secondary | ICD-10-CM

## 2022-12-29 DIAGNOSIS — E559 Vitamin D deficiency, unspecified: Secondary | ICD-10-CM | POA: Diagnosis not present

## 2022-12-29 MED ORDER — METFORMIN HCL 500 MG PO TABS: ORAL_TABLET | ORAL | 0 refills | Status: DC

## 2022-12-29 MED ORDER — LEVOCETIRIZINE DIHYDROCHLORIDE 5 MG PO TABS: 5.0000 mg | ORAL_TABLET | Freq: Every evening | ORAL | 0 refills | Status: DC

## 2022-12-29 MED ORDER — VITAMIN D (CHOLECALCIFEROL) 25 MCG (1000 UT) PO CAPS: ORAL_CAPSULE | ORAL | Status: DC

## 2022-12-29 NOTE — Progress Notes (Signed)
Desiree Andrews, D.O.  ABFM, ABOM Specializing in Clinical Bariatric Medicine  Office located at: [address] [place], [place]  [address]     Assessment and Plan:   No orders of the defined types were placed in this encounter.   Medications Discontinued During This Encounter  Medication Reason   Vitamin D, Cholecalciferol, 25 MCG (1000 UT) CAPS Reorder   metFORMIN (GLUCOPHAGE) 500 MG tablet Reorder   levocetirizine (XYZAL) 5 MG tablet Reorder     Meds ordered this encounter  Medications   Vitamin D, Cholecalciferol, 25 MCG (1000 UT) CAPS    Sig: 1000 IU vit D one day, then 2,000 IU the next    Dispense:  60 capsule   metFORMIN (GLUCOPHAGE) 500 MG tablet    Sig: TAKE 1 TABLET(500 MG) BY MOUTH TWICE DAILY WITH A MEAL    Dispense:  60 tablet    Refill:  0   levocetirizine (XYZAL) 5 MG tablet    Sig: Take 1 tablet (5 mg total) by mouth every evening.    Dispense:  30 tablet    Refill:  0     Vitamin D deficiency Assessment: Condition is not quite at goal. Labs were reviewed.  Lab Results  Component Value Date   VD25OH 48.2 11/12/2022   VD25OH 42.5 06/30/2022   VD25OH 38.96 02/03/2022  - Her Vitamin D levels are not quite within the normal range of 50-80.  - She endorses taking OTC Vitamin D 1,000 IU daily. Denies any side effects.   Plan: - Increased Vitamin D to 1,000 IU one day and then 2,000 IU the next.  - weight loss will likely improve availability of vitamin D, thus encouraged Desiree Andrews to continue with meal plan and their weight loss efforts to further improve this condition.     Prediabetes Assessment: Condition is not optimized. Labs were reviewed.  Lab Results  Component Value Date   HGBA1C 6.3 (H) 11/12/2022   HGBA1C 6.3 (H) 06/30/2022   HGBA1C 6.0 02/03/2022   INSULIN 14.7 06/30/2022  - Her recent A1c levels have not changed from prior.  - She endorses that she strictly takes her Metformin in the morning, however sometimes forgets her second  tablet at night.  - Her hunger and cravings are well controlled when eating on plan.   Plan: - I highly recommended patient to take her Metformin BID with meals because her A1c levels have not changed since November 2023.  - Desiree Andrews will continue to work on weight loss, exercise, via their meal plan we devised to help decrease the risk of progressing to diabetes.    Environmental and seasonal allergies Assessment: Condition is stable. Symptoms are well controlled with Xyzal. Patient reports compliance with medication and good tolerance.  Plan: Continue current regimen. Will refill Xyzal today.   TREATMENT PLAN FOR OBESITY: Obesity, current BMI 29.81-starting bmi 31.09/date 06/30/22 BMI 30.0-30.9,adult Assessment: Condition is improving, but not optimized. Biometric data collected today, was reviewed with patient.  Fat mass has decreased by 0.4 lb. Muscle mass has decreased by 0.8 lb. Total body water has not changed.   Plan:  Desiree Andrews is currently in the action stage of change. As such, her goal is to continue weight management plan. Desiree Andrews will work on healthier eating habits and continue with Category 2 meal plan.  Behavioral Intervention Additional resources provided today: category 2 meal plan information Evidence-based interventions for health behavior change were utilized today including the discussion of self monitoring techniques, problem-solving  barriers and SMART goal setting techniques.   Regarding patient's less desirable eating habits and patterns, we employed the technique of small changes.  Pt will specifically work on: taking her Metformin BID and continue adherence to Category 2 meal plan for next visit.    Recommended Physical Activity Goals Desiree Andrews has been advised to work up to 150 minutes of moderate intensity aerobic activity a week and strengthening exercises 2-3 times per week for cardiovascular health, weight loss maintenance and preservation of muscle mass.  She has  agreed to Continue current level of physical activity   FOLLOW UP: No follow-ups on file. She was informed of the importance of frequent follow up visits to maximize her success with intensive lifestyle modifications for her multiple health conditions.  Subjective:   Chief complaint: Obesity Desiree Andrews is here to discuss her progress with her obesity treatment plan. She is on the the Category 2 Plan and states she is following her eating plan approximately 40% of the time. She states she is walking 11,000 steps 6 days per week.  Interval History:  Desiree Andrews is here for a follow up office visit.  Since last office visit she: - "Partied and had fun" at the beach for 1.5 week.  - Once she returned from the beach, she has been eating on plan mostly.  - She endorses eating regular bread "once in a while".  - When eating on plan, her hunger and cravings are well controlled.     Pharmacotherapy for weight loss: She is currently taking  Metformin  for medical weight loss.  Denies side effects.    Review of Systems:  Pertinent positives were addressed with patient today.  Weight Summary and Biometrics   Weight Lost Since Last Visit: 2 lb  Weight Gained Since Last Visit: 0    Vitals BP: 137/84 Pulse Rate: (!) 109 SpO2: 96 %   Anthropometric Measurements Height: 5\' 2"  (1.575 m) Weight: 163 lb (73.9 kg) BMI (Calculated): 29.81 Weight at Last Visit: 165 lb Weight Lost Since Last Visit: 2 lb Weight Gained Since Last Visit: 0 Starting Weight: 170 lb Total Weight Loss (lbs): 7 lb (3.175 kg) Peak Weight: 174 lb   Body Composition  Body Fat %: 39.8 % Fat Mass (lbs): 65.2 lbs Muscle Mass (lbs): 93.6 lbs Total Body Water (lbs): 65.6 lbs Visceral Fat Rating : 11   Other Clinical Data Fasting: No Labs: No Today's Visit #: 8 Starting Date: 07/01/23   Objective:   PHYSICAL EXAM: Blood pressure 137/84, pulse (!) 109, height 5\' 2"  (1.575 m), weight 163 lb (73.9 kg), SpO2  96 %. Body mass index is 29.81 kg/m.  General: Well Developed, well nourished, and in no acute distress.  HEENT: Normocephalic, atraumatic Skin: Warm and dry, cap RF less 2 sec, good turgor Chest:  Normal excursion, shape, no gross abn Respiratory: speaking in full sentences, no conversational dyspnea NeuroM-Sk: Ambulates w/o assistance, moves * 4 Psych: A and O *3, insight good, mood-full  DIAGNOSTIC DATA REVIEWED:  BMET    Component Value Date/Time   NA 141 06/30/2022 1229   K 4.5 06/30/2022 1229   CL 101 06/30/2022 1229   CO2 22 06/30/2022 1229   GLUCOSE 110 (H) 06/30/2022 1229   GLUCOSE 98 02/03/2022 1247   BUN 14 06/30/2022 1229   CREATININE 0.54 (L) 06/30/2022 1229   CALCIUM 9.7 06/30/2022 1229   GFRNONAA >60 08/17/2019 1027   GFRAA >60 08/17/2019 1027   Lab Results  Component Value Date  HGBA1C 6.3 (H) 11/12/2022   HGBA1C 6.1 05/01/2009   Lab Results  Component Value Date   INSULIN 14.7 06/30/2022   Lab Results  Component Value Date   TSH 0.942 06/30/2022   CBC    Component Value Date/Time   WBC 6.0 06/30/2022 1229   WBC 6.1 08/12/2021 0757   RBC 4.71 06/30/2022 1229   RBC 4.63 08/12/2021 0757   HGB 14.8 06/30/2022 1229   HGB 15.1 06/12/2010 1439   HCT 43.7 06/30/2022 1229   HCT 43.2 06/12/2010 1439   PLT 251 06/30/2022 1229   MCV 93 06/30/2022 1229   MCV 94.5 06/12/2010 1439   MCH 31.4 06/30/2022 1229   MCH 31.7 08/17/2019 1027   MCHC 33.9 06/30/2022 1229   MCHC 34.1 08/12/2021 0757   RDW 12.1 06/30/2022 1229   RDW 12.6 06/12/2010 1439   Iron Studies No results found for: "IRON", "TIBC", "FERRITIN", "IRONPCTSAT" Lipid Panel     Component Value Date/Time   CHOL 216 (H) 06/30/2022 1229   TRIG 316 (H) 06/30/2022 1229   HDL 52 06/30/2022 1229   CHOLHDL 5 02/03/2022 1247   VLDL 70.0 (H) 08/12/2021 0757   LDLCALC 110 (H) 06/30/2022 1229   LDLDIRECT 123.0 02/03/2022 1247   Hepatic Function Panel     Component Value Date/Time   PROT 6.9  06/30/2022 1229   ALBUMIN 4.6 06/30/2022 1229   AST 26 06/30/2022 1229   ALT 28 06/30/2022 1229   ALKPHOS 85 06/30/2022 1229   BILITOT 0.4 06/30/2022 1229   BILIDIR 0.0 09/27/2013 0822      Component Value Date/Time   TSH 0.942 06/30/2022 1229   Nutritional Lab Results  Component Value Date   VD25OH 48.2 11/12/2022   VD25OH 42.5 06/30/2022   VD25OH 38.96 02/03/2022    Attestations:   Reviewed by clinician on day of visit: allergies, medications, problem list, medical history, surgical history, family history, social history, and previous encounter notes.  I,Desiree Andrews,acting as a Neurosurgeon for [employer], DO.,have documented all relevant documentation on the behalf of Desiree Lot, DO,as directed by  Desiree Lot, DO while in the presence of Desiree Lot, DO.   I, Desiree Lot, DO, have reviewed all documentation for this visit. The documentation on 12/29/22 for the exam, diagnosis, procedures, and orders are all accurate and complete.

## 2022-12-30 MED ORDER — LEVOCETIRIZINE DIHYDROCHLORIDE 5 MG PO TABS: 5.0000 mg | ORAL_TABLET | Freq: Every evening | ORAL | 0 refills | Status: DC

## 2023-01-04 DIAGNOSIS — K080 Exfoliation of teeth due to systemic causes: Secondary | ICD-10-CM | POA: Diagnosis not present

## 2023-01-16 ENCOUNTER — Other Ambulatory Visit (INDEPENDENT_AMBULATORY_CARE_PROVIDER_SITE_OTHER): Payer: Self-pay | Admitting: Pediatrics

## 2023-01-16 DIAGNOSIS — R7303 Prediabetes: Secondary | ICD-10-CM

## 2023-01-19 NOTE — Telephone Encounter (Signed)
Forwarded Rx refill request

## 2023-01-21 ENCOUNTER — Ambulatory Visit (INDEPENDENT_AMBULATORY_CARE_PROVIDER_SITE_OTHER): Payer: Medicare Other | Admitting: Family Medicine

## 2023-01-21 ENCOUNTER — Other Ambulatory Visit (INDEPENDENT_AMBULATORY_CARE_PROVIDER_SITE_OTHER): Payer: Self-pay | Admitting: Family Medicine

## 2023-01-21 ENCOUNTER — Encounter (INDEPENDENT_AMBULATORY_CARE_PROVIDER_SITE_OTHER): Payer: Self-pay | Admitting: Family Medicine

## 2023-01-21 VITALS — BP 119/76 | HR 89 | Temp 97.8°F | Ht 62.0 in | Wt 167.0 lb

## 2023-01-21 DIAGNOSIS — E559 Vitamin D deficiency, unspecified: Secondary | ICD-10-CM | POA: Diagnosis not present

## 2023-01-21 DIAGNOSIS — R7303 Prediabetes: Secondary | ICD-10-CM | POA: Diagnosis not present

## 2023-01-21 DIAGNOSIS — E669 Obesity, unspecified: Secondary | ICD-10-CM

## 2023-01-21 DIAGNOSIS — J3089 Other allergic rhinitis: Secondary | ICD-10-CM

## 2023-01-21 DIAGNOSIS — Z6830 Body mass index (BMI) 30.0-30.9, adult: Secondary | ICD-10-CM

## 2023-01-21 MED ORDER — LEVOCETIRIZINE DIHYDROCHLORIDE 5 MG PO TABS: 5.0000 mg | ORAL_TABLET | Freq: Every evening | ORAL | 0 refills | Status: DC

## 2023-01-21 MED ORDER — METFORMIN HCL 500 MG PO TABS: ORAL_TABLET | ORAL | 0 refills | Status: DC

## 2023-01-21 NOTE — Progress Notes (Signed)
Desiree Andrews, D.O.  ABFM, ABOM Specializing in Clinical Bariatric Medicine  Office located at: [address], [place]  [address]     Assessment and Plan:   Medications Discontinued During This Encounter  Medication Reason   metFORMIN (GLUCOPHAGE) 500 MG tablet Reorder   levocetirizine (XYZAL) 5 MG tablet    levocetirizine (XYZAL) 5 MG tablet      Meds ordered this encounter  Medications   metFORMIN (GLUCOPHAGE) 500 MG tablet    Sig: TAKE 1 TABLET(500 MG) BY MOUTH TWICE DAILY WITH A MEAL    Dispense:  60 tablet    Refill:  0   levocetirizine (XYZAL) 5 MG tablet    Sig: Take 1 tablet (5 mg total) by mouth every evening.    Dispense:  30 tablet    Refill:  0     Prediabetes Assessment: Condition is not optimized.  Lab Results  Component Value Date   HGBA1C 6.3 (H) 11/12/2022   HGBA1C 6.3 (H) 06/30/2022   HGBA1C 6.0 02/03/2022   INSULIN 14.7 06/30/2022  - She endorses that she takes her Metformin in the morning, however sometimes forgets her second tablet at night.  - Endorses that her hunger has increased this past week, which may be due to her not taking her Metformin consistently and not eating all the protein on the meal plan.    Plan: - Continue with med. Will refill this today.    - Continue to decrease simple carbs/ sugars; increase fiber and proteins -> follow her meal plan.   - We will recheck A1c and fasting insulin level in approximately 3 months from last check, or as deemed appropriate.     Environmental and seasonal allergies Assessment: Condition is stable. Symptoms are well controlled with Xyzal. Patient reports compliance with medications and good tolerance.  Plan: Continue current regimen. Will refill Xyzal today.  - Continue with low inflammatory meal plan.    Vitamin D deficiency Assessment: Condition is not quite at goal  Lab Results  Component Value Date   VD25OH 48.2 11/12/2022   VD25OH 42.5 06/30/2022   VD25OH 38.96  02/03/2022  - Reports good compliance and tolerance with OTC Vitamin D 2,000 IU daily. Denies any adverse effects.  Plan: - Continue with OTC supplement. -We will continue to monitor levels regularly (every 3-4 mo on average) to keep levels within normal limits and prevent over supplementation.   TREATMENT PLAN FOR OBESITY: BMI 30.0-30.9,adult Obesity, current BMI 30.54 -starting bmi 31.09/date 06/30/22 Assessment:  Desiree Andrews is here to discuss her progress with her obesity treatment plan along with follow-up of her obesity related diagnoses. See Medical Weight Management Flowsheet for complete bioelectrical impedance results.  Condition is not optimized. Biometric data collected today, was reviewed with patient.   Since last office visit on 12/29/22 patient's  Muscle mass has decreased by 0.8 lb. Fat mass has decreased by 4.2 lb. Total body water has increased by 2.8 lb.  Counseling done on how various foods will affect these numbers and how to maximize success  Total lbs lost to date: 3 Total weight loss percentage to date: 1.76   Plan:  - Continue with the Category 2 meal plan with breakfast and lunch options.  Behavioral Intervention Additional resources provided today: category 2 meal plan information, breakfast options, lunch options, and Crock Pot  recipes  Evidence-based interventions for health behavior change were utilized today including the discussion of self monitoring techniques, problem-solving barriers and SMART  goal setting techniques.   Regarding patient's less desirable eating habits and patterns, we employed the technique of small changes.  Pt will specifically work on: taking her Metformin consistently and eating all the protein on the meal plan for next visit.    Recommended Physical Activity Goals  Desiree Andrews has been advised to slowly work up to 150 minutes of moderate intensity aerobic activity a week and strengthening exercises 2-3 times per week for  cardiovascular health, weight loss maintenance and preservation of muscle mass.   She has agreed to Continue current level of physical activity    FOLLOW UP: Return in about 3 weeks (around 02/11/2023). She was informed of the importance of frequent follow up visits to maximize her success with intensive lifestyle modifications for her multiple health conditions.   Subjective:   Chief complaint: Obesity Desiree Andrews is here to discuss her progress with her obesity treatment plan. She is on the the Category 2 Plan and states she is following her eating plan approximately 85% of the time. She states she is not exercising.   Interval History:  Desiree Andrews is here for a follow up office visit.     Since last office visit:   - She has been walking around at work 6 days a week.  - Endorses that her hunger has increased this past week.  - For lunch, she typically eats a butter ball [place] sausage with [name].  - Endorses that lunch is her biggest challenge because she is "always in a hurry".   Pharmacotherapy for weight loss: She is currently taking  Metformin  for medical weight loss.  Denies side effects.    Review of Systems:  Pertinent positives were addressed with patient today.  Weight Summary and Biometrics   Weight Lost Since Last Visit: 0  Weight Gained Since Last Visit: 4 lb    Vitals Temp: 97.8 F (36.6 C) BP: 119/76 Pulse Rate: 89 SpO2: 95 %   Anthropometric Measurements Height: 5\' 2"  (1.575 m) Weight: 167 lb (75.8 kg) BMI (Calculated): 30.54 Weight at Last Visit: 163 lb Weight Lost Since Last Visit: 0 Weight Gained Since Last Visit: 4 lb Starting Weight: 170 lb Peak Weight: 174 lb   Body Composition  Body Fat %: 41.5 % Fat Mass (lbs): 69.4 lbs Muscle Mass (lbs): 92.8 lbs Total Body Water (lbs): 68.4 lbs Visceral Fat Rating : 12   Other Clinical Data Fasting: No Labs: No Today's Visit #: 9 Starting Date: 07/01/23    Objective:    PHYSICAL EXAM: Blood pressure 119/76, pulse 89, temperature 97.8 F (36.6 C), height 5\' 2"  (1.575 m), weight 167 lb (75.8 kg), SpO2 95 %. Body mass index is 30.54 kg/m.  General: Well Developed, well nourished, and in no acute distress.  HEENT: Normocephalic, atraumatic Skin: Warm and dry, cap RF less 2 sec, good turgor Chest:  Normal excursion, shape, no gross abn Respiratory: speaking in full sentences, no conversational dyspnea NeuroM-Sk: Ambulates w/o assistance, moves * 4 Psych: A and O *3, insight good, mood-full  DIAGNOSTIC DATA REVIEWED:  BMET    Component Value Date/Time   NA 141 06/30/2022 1229   K 4.5 06/30/2022 1229   CL 101 06/30/2022 1229   CO2 22 06/30/2022 1229   GLUCOSE 110 (H) 06/30/2022 1229   GLUCOSE 98 02/03/2022 1247   BUN 14 06/30/2022 1229   CREATININE 0.54 (L) 06/30/2022 1229   CALCIUM 9.7 06/30/2022 1229   GFRNONAA >60 08/17/2019 1027   GFRAA >60  08/17/2019 1027   Lab Results  Component Value Date   HGBA1C 6.3 (H) 11/12/2022   HGBA1C 6.1 05/01/2009   Lab Results  Component Value Date   INSULIN 14.7 06/30/2022   Lab Results  Component Value Date   TSH 0.942 06/30/2022   CBC    Component Value Date/Time   WBC 6.0 06/30/2022 1229   WBC 6.1 08/12/2021 0757   RBC 4.71 06/30/2022 1229   RBC 4.63 08/12/2021 0757   HGB 14.8 06/30/2022 1229   HGB 15.1 06/12/2010 1439   HCT 43.7 06/30/2022 1229   HCT 43.2 06/12/2010 1439   PLT 251 06/30/2022 1229   MCV 93 06/30/2022 1229   MCV 94.5 06/12/2010 1439   MCH 31.4 06/30/2022 1229   MCH 31.7 08/17/2019 1027   MCHC 33.9 06/30/2022 1229   MCHC 34.1 08/12/2021 0757   RDW 12.1 06/30/2022 1229   RDW 12.6 06/12/2010 1439   Iron Studies No results found for: "IRON", "TIBC", "FERRITIN", "IRONPCTSAT" Lipid Panel     Component Value Date/Time   CHOL 216 (H) 06/30/2022 1229   TRIG 316 (H) 06/30/2022 1229   HDL 52 06/30/2022 1229   CHOLHDL 5 02/03/2022 1247   VLDL 70.0 (H) 08/12/2021 0757    LDLCALC 110 (H) 06/30/2022 1229   LDLDIRECT 123.0 02/03/2022 1247   Hepatic Function Panel     Component Value Date/Time   PROT 6.9 06/30/2022 1229   ALBUMIN 4.6 06/30/2022 1229   AST 26 06/30/2022 1229   ALT 28 06/30/2022 1229   ALKPHOS 85 06/30/2022 1229   BILITOT 0.4 06/30/2022 1229   BILIDIR 0.0 09/27/2013 0822      Component Value Date/Time   TSH 0.942 06/30/2022 1229   Nutritional Lab Results  Component Value Date   VD25OH 48.2 11/12/2022   VD25OH 42.5 06/30/2022   VD25OH 38.96 02/03/2022    Attestations:   Reviewed by clinician on day of visit: allergies, medications, problem list, medical history, surgical history, family history, social history, and previous encounter notes.   I,Special Puri,acting as a Neurosurgeon for [employer], DO.,have documented all relevant documentation on the behalf of Thomasene Lot, DO,as directed by  Thomasene Lot, DO while in the presence of Thomasene Lot, DO.   I, Thomasene Lot, DO, have reviewed all documentation for this visit. The documentation on 01/21/23 for the exam, diagnosis, procedures, and orders are all accurate and complete.

## 2023-02-02 NOTE — Progress Notes (Unsigned)
Subjective:    Patient ID: Desiree Andrews, female    DOB: 1956-03-28, 67 y.o.   MRN: [id number]     HPI Desiree Andrews is here for follow up of her chronic medical problems.  Has been losing weight  - regained some [facility].  Still going to [facility]  She is feeling a little depressed at times.  It is not often is not a lot of depression but she does feel it.  She denies anxiety.  No regular exercise. She walks a lot during the day.  Medications and allergies reviewed with patient and updated if appropriate.  Current Outpatient Medications on File Prior to Visit  Medication Sig Dispense Refill   amLODipine (NORVASC) 2.5 MG tablet TAKE 1 TABLET(2.5 MG) BY MOUTH DAILY 90 tablet 1   amphetamine-dextroamphetamine (ADDERALL) 20 MG tablet Take 10 mg by mouth 2 (two) times daily.   0   atorvastatin (LIPITOR) 40 MG tablet TAKE 1 TABLET BY MOUTH DAILY. 90 tablet 2   betamethasone dipropionate (DIPROLENE) 0.05 % ointment Apply topically 2 (two) times daily. 30 g 0   clobetasol cream (TEMOVATE) 0.05 % Apply 1 application topically 2 (two) times daily as needed. 30 g 2   hydrochlorothiazide (HYDRODIURIL) 25 MG tablet TAKE 1 TABLET(25 MG) BY MOUTH DAILY 90 tablet 0   ibuprofen (ADVIL) 600 MG tablet Take 600 mg by mouth every 8 (eight) hours as needed.     levocetirizine (XYZAL) 5 MG tablet Take 1 tablet (5 mg total) by mouth every evening. 30 tablet 0   metFORMIN (GLUCOPHAGE) 500 MG tablet TAKE 1 TABLET(500 MG) BY MOUTH TWICE DAILY WITH A MEAL 60 tablet 0   metoprolol tartrate (LOPRESSOR) 50 MG tablet TAKE 1 AND 1/2 TABLETS(75 MG) BY MOUTH TWICE DAILY 270 tablet 1   tacrolimus (PROTOPIC) 0.1 % ointment Apply topically 2 (two) times daily. 100 g 2   triamcinolone cream (KENALOG) 0.1 % Apply 1 Application topically 2 (two) times daily. 30 g 0   Vitamin D, Cholecalciferol, 25 MCG (1000 UT) CAPS 1000 IU vit D one day, then 2,000 IU the next 60 capsule     No current facility-administered medications on file prior to visit.     Review of Systems  Constitutional:  Negative for fever.  Respiratory:  Negative for cough, shortness of breath and wheezing.   Cardiovascular:  Negative for chest pain, palpitations and leg swelling.  Skin:        Itching arms - legs  Neurological:  Negative for light-headedness and headaches.  Psychiatric/Behavioral:  Positive for dysphoric mood. The patient is not nervous/anxious.        Objective:   Vitals:   02/03/23 0910  BP: 120/78  Pulse: 73  Temp: 98.2 F (36.8 C)  SpO2: 94%   BP Readings from Last 3 Encounters:  02/03/23 120/78  01/21/23 119/76  12/29/22 137/84   Wt Readings from Last 3 Encounters:  02/03/23 169 lb (76.7 kg)  01/21/23 167 lb (75.8 kg)  12/29/22 163 lb (73.9 kg)   Body mass index is 30.91 kg/m.    Physical Exam Constitutional:      General: She is not in acute distress.    Appearance: Normal appearance.  HENT:     Head: Normocephalic and atraumatic.  Eyes:     Conjunctiva/sclera: Conjunctivae normal.  Cardiovascular:     Rate and Rhythm: Normal rate and regular rhythm.     Heart sounds:  Normal heart sounds.  Pulmonary:     Effort: Pulmonary effort is normal. No respiratory distress.     Breath sounds: Normal breath sounds. No wheezing.  Musculoskeletal:     Cervical back: Neck supple.     Right lower leg: No edema.     Left lower leg: No edema.  Lymphadenopathy:     Cervical: No cervical adenopathy.  Skin:    General: Skin is warm and dry.     Findings: No rash.  Neurological:     Mental Status: She is alert. Mental status is at baseline.  Psychiatric:        Mood and Affect: Mood normal.        Behavior: Behavior normal.        Lab Results  Component Value Date   WBC 6.0 06/30/2022   HGB 14.8 06/30/2022   HCT 43.7 06/30/2022   PLT 251 06/30/2022   GLUCOSE 110 (H) 06/30/2022   CHOL 216 (H) 06/30/2022   TRIG 316 (H) 06/30/2022   HDL 52  06/30/2022   LDLDIRECT 123.0 02/03/2022   LDLCALC 110 (H) 06/30/2022   ALT 28 06/30/2022   AST 26 06/30/2022   NA 141 06/30/2022   K 4.5 06/30/2022   CL 101 06/30/2022   CREATININE 0.54 (L) 06/30/2022   BUN 14 06/30/2022   CO2 22 06/30/2022   TSH 0.942 06/30/2022   HGBA1C 6.3 (H) 11/12/2022     Assessment & Plan:    See Problem List for Assessment and Plan of chronic medical problems.

## 2023-02-02 NOTE — Patient Instructions (Addendum)
      Blood work was ordered.   The lab is on the first floor.    Medications changes include :       A referral was ordered and someone will call you to schedule an appointment.     Return in about 6 months (around 08/05/2023) for Physical Exam.

## 2023-02-03 ENCOUNTER — Encounter: Payer: Self-pay | Admitting: Internal Medicine

## 2023-02-03 ENCOUNTER — Ambulatory Visit (INDEPENDENT_AMBULATORY_CARE_PROVIDER_SITE_OTHER): Payer: Medicare Other | Admitting: Internal Medicine

## 2023-02-03 VITALS — BP 120/78 | HR 73 | Temp 98.2°F | Ht 62.0 in | Wt 169.0 lb

## 2023-02-03 DIAGNOSIS — F32A Depression, unspecified: Secondary | ICD-10-CM | POA: Insufficient documentation

## 2023-02-03 DIAGNOSIS — E559 Vitamin D deficiency, unspecified: Secondary | ICD-10-CM | POA: Diagnosis not present

## 2023-02-03 DIAGNOSIS — E785 Hyperlipidemia, unspecified: Secondary | ICD-10-CM

## 2023-02-03 DIAGNOSIS — M8589 Other specified disorders of bone density and structure, multiple sites: Secondary | ICD-10-CM

## 2023-02-03 DIAGNOSIS — R7303 Prediabetes: Secondary | ICD-10-CM | POA: Diagnosis not present

## 2023-02-03 DIAGNOSIS — E7849 Other hyperlipidemia: Secondary | ICD-10-CM

## 2023-02-03 DIAGNOSIS — I10 Essential (primary) hypertension: Secondary | ICD-10-CM

## 2023-02-03 DIAGNOSIS — F3289 Other specified depressive episodes: Secondary | ICD-10-CM

## 2023-02-03 DIAGNOSIS — R600 Localized edema: Secondary | ICD-10-CM

## 2023-02-03 MED ORDER — AMLODIPINE BESYLATE 2.5 MG PO TABS: ORAL_TABLET | ORAL | 1 refills | Status: DC

## 2023-02-03 MED ORDER — HYDROCHLOROTHIAZIDE 25 MG PO TABS: ORAL_TABLET | ORAL | 1 refills | Status: DC

## 2023-02-03 MED ORDER — BUPROPION HCL ER (XL) 150 MG PO TB24: 150.0000 mg | ORAL_TABLET | Freq: Every day | ORAL | 1 refills | Status: DC

## 2023-02-03 MED ORDER — ATORVASTATIN CALCIUM 40 MG PO TABS: ORAL_TABLET | ORAL | 2 refills | Status: DC

## 2023-02-03 NOTE — Assessment & Plan Note (Signed)
Chronic Taking vitamin D daily Check vitamin D level  

## 2023-02-03 NOTE — Assessment & Plan Note (Addendum)
Chronic Taking metformin 500 mg twice daily Working with [facility] clinic for weight loss and lifestyle changes Low sugar/carbohydrate diet advised  Lab Results  Component Value Date   HGBA1C 6.3 (H) 11/12/2022

## 2023-02-03 NOTE — Assessment & Plan Note (Addendum)
New mild in nature-some of which is related to her family Does not feel anxious Will start Wellbutrin XL 150 mg daily-I think this will help with her depression with minimal side effects.

## 2023-02-03 NOTE — Assessment & Plan Note (Signed)
Chronic Regular exercise and healthy diet encouraged Check lipid panel, CMP Continue atorvastatin 40 mg daily 

## 2023-02-03 NOTE — Assessment & Plan Note (Signed)
Chronic Controlled Continue hydrochlorothiazide 25 mg daily 

## 2023-02-03 NOTE — Assessment & Plan Note (Signed)
Chronic Blood pressure well controlled CMP Continue amlodipine 2.5 mg daily, HCTZ 25 mg daily, metoprolol 75 mg twice daily 

## 2023-02-03 NOTE — Assessment & Plan Note (Signed)
Chronic ?DEXA due-ordered ?Encouraged regular exercise ?Advised calcium and vitamin D daily ? ?

## 2023-02-05 ENCOUNTER — Other Ambulatory Visit (INDEPENDENT_AMBULATORY_CARE_PROVIDER_SITE_OTHER): Payer: Medicare Other

## 2023-02-05 DIAGNOSIS — I10 Essential (primary) hypertension: Secondary | ICD-10-CM

## 2023-02-05 DIAGNOSIS — R7303 Prediabetes: Secondary | ICD-10-CM | POA: Diagnosis not present

## 2023-02-05 DIAGNOSIS — E559 Vitamin D deficiency, unspecified: Secondary | ICD-10-CM | POA: Diagnosis not present

## 2023-02-05 DIAGNOSIS — E785 Hyperlipidemia, unspecified: Secondary | ICD-10-CM

## 2023-02-05 LAB — COMPREHENSIVE METABOLIC PANEL
ALT: 29 U/L (ref 0–35)
AST: 22 U/L (ref 0–37)
Albumin: 4.5 g/dL (ref 3.5–5.2)
Alkaline Phosphatase: 69 U/L (ref 39–117)
BUN: 19 mg/dL (ref 6–23)
CO2: 25 mEq/L (ref 19–32)
Calcium: 9.6 mg/dL (ref 8.4–10.5)
Chloride: 101 mEq/L (ref 96–112)
Creatinine, Ser: 0.67 mg/dL (ref 0.40–1.20)
GFR: 89.57 mL/min (ref >60.00)
Glucose, Bld: 124 mg/dL — ABNORMAL HIGH (ref 70–99)
Potassium: 4 mEq/L (ref 3.5–5.1)
Sodium: 137 mEq/L (ref 135–145)
Total Bilirubin: 0.7 mg/dL (ref 0.2–1.2)
Total Protein: 7.4 g/dL (ref 6.0–8.3)

## 2023-02-05 LAB — HEMOGLOBIN A1C: Hgb A1c MFr Bld: 6.2 % (ref 4.6–6.5)

## 2023-02-05 LAB — LIPID PANEL
Cholesterol: 190 mg/dL (ref 0–200)
HDL: 47.1 mg/dL (ref >39.00)
NonHDL: 142.9
Total CHOL/HDL Ratio: 4
Triglycerides: 335 mg/dL — ABNORMAL HIGH (ref 0.0–149.0)
VLDL: 67 mg/dL — ABNORMAL HIGH (ref 0.0–40.0)

## 2023-02-05 LAB — VITAMIN D 25 HYDROXY (VIT D DEFICIENCY, FRACTURES): VITD: 42.96 ng/mL (ref 30.00–100.00)

## 2023-02-05 LAB — LDL CHOLESTEROL, DIRECT: Direct LDL: 87 mg/dL

## 2023-02-11 ENCOUNTER — Ambulatory Visit (INDEPENDENT_AMBULATORY_CARE_PROVIDER_SITE_OTHER): Payer: Medicare Other | Admitting: Family Medicine

## 2023-02-11 ENCOUNTER — Encounter (INDEPENDENT_AMBULATORY_CARE_PROVIDER_SITE_OTHER): Payer: Self-pay | Admitting: Family Medicine

## 2023-02-11 VITALS — BP 123/79 | HR 85 | Temp 97.7°F | Ht 62.0 in | Wt 166.0 lb

## 2023-02-11 DIAGNOSIS — R7303 Prediabetes: Secondary | ICD-10-CM

## 2023-02-11 DIAGNOSIS — E559 Vitamin D deficiency, unspecified: Secondary | ICD-10-CM

## 2023-02-11 DIAGNOSIS — E7849 Other hyperlipidemia: Secondary | ICD-10-CM

## 2023-02-11 DIAGNOSIS — E669 Obesity, unspecified: Secondary | ICD-10-CM

## 2023-02-11 DIAGNOSIS — Z789 Other specified health status: Secondary | ICD-10-CM

## 2023-02-11 DIAGNOSIS — Z6830 Body mass index (BMI) 30.0-30.9, adult: Secondary | ICD-10-CM

## 2023-02-11 MED ORDER — VITAMIN D (CHOLECALCIFEROL) 25 MCG (1000 UT) PO CAPS
ORAL_CAPSULE | ORAL | Status: DC
Start: 2023-02-11 — End: 2023-06-09

## 2023-02-11 NOTE — Progress Notes (Signed)
Carlye Grippe, D.O.  ABFM, ABOM Specializing in Clinical Bariatric Medicine  Office located at: [address] [place], [place]  [address]     Assessment and Plan:   Medications Discontinued During This Encounter  Medication Reason   Vitamin D, Cholecalciferol, 25 MCG (1000 UT) CAPS Reorder     Meds ordered this encounter  Medications   Vitamin D, Cholecalciferol, 25 MCG (1000 UT) CAPS    Sig: 2,000 IU QD    Dispense:  60 capsule     Prediabetes Assessment: Her prediabetes is being treated with Metformin 500 mg BID. Denies any GI upset. Pt had labs with PCP on 02/05/23 and she requested that we review them with her today.   Labs below indicate that: Her A1c has slightly improved from 6.3 on 11/12/22 to 6.2. Her kidney and liver function appear stable and reassuring. Labs were reviewed with patient today and education provided on them. We discussed how the foods patient eats may influence these laboratory findings.  All of the patient's questions about them were answered    Lab Results  Component Value Date   HGBA1C 6.2 02/05/2023   HGBA1C 6.3 (H) 11/12/2022   HGBA1C 6.3 (H) 06/30/2022   INSULIN 14.7 06/30/2022   Lab Results  Component Value Date   CREATININE 0.67 02/05/2023   BUN 19 02/05/2023   NA 137 02/05/2023   K 4.0 02/05/2023   CL 101 02/05/2023   CO2 25 02/05/2023      Component Value Date/Time   PROT 7.4 02/05/2023 0828   PROT 6.9 06/30/2022 1229   ALBUMIN 4.5 02/05/2023 0828   ALBUMIN 4.6 06/30/2022 1229   AST 22 02/05/2023 0828   ALT 29 02/05/2023 0828   ALKPHOS 69 02/05/2023 0828   BILITOT 0.7 02/05/2023 0828   BILITOT 0.4 06/30/2022 1229   BILIDIR 0.0 09/27/2013 1914     Plan: Continue with prediabetic medication.   Nyrobi will continue to work on weight loss, exercise, via their meal plan we devised to help decrease the risk of progressing to diabetes.  We will recheck A1c and fasting insulin level in approximately 3 months from last  check, or as deemed appropriate.    Other hyperlipidemia Assessment: Condition is being treated with Lipitor 40 mg daily and she is tolerating medication well. Pt had labs with PCP on 02/05/23 and she requested that we review them with her today.   Labs below indicate that: Her Triglycerides are still elevated, her LDL is improving (goal of <782),  and her HDL levels are stable. Labs were reviewed with pt today and education provided on them and how the foods patient eats may influence these findings. All questions were answered about them.    Lab Results  Component Value Date   CHOL 190 02/05/2023   HDL 47.10 02/05/2023   LDLCALC 110 (H) 06/30/2022   LDLDIRECT 87.0 02/05/2023   TRIG 335.0 (H) 02/05/2023   CHOLHDL 4 02/05/2023   Plan: Bryson Dames Reichow agrees to continue with statin and  our treatment plan of a heart-heathy, low cholesterol meal plan - I stressed the importance that patient continue with our prudent nutritional plan that is low in saturated and trans fats, and low in fatty carbs to improve these numbers.  - We recommend: aerobic activity with eventual goal of a minimum of 150+ min wk plus 2 days/ week of resistance or strength training.   - We will continue routine screening as patient continues to achieve health  goals along their weight loss journey     Vitamin D deficiency Assessment: Condition is not at goal. Pt had labs with PCP on 02/05/23 and she requested that we review them with her today.   Her Vitamin D deficiency is being treated with OTC Vitamin D 1,000 lU one day, then 2,000 lU next day. Her labs below indicate that her Vitamin D levels are not within the recommended limits of 50-80. Labs were reviewed with pt today and all questions were answered about them.    Lab Results  Component Value Date   VD25OH 42.96 02/05/2023   VD25OH 48.2 11/12/2022   VD25OH 42.5 06/30/2022   Plan: I recommended pt to take OTC Vitamin D 2,000 lU daily for now  Weight  loss will likely improve availability of vitamin D, thus encouraged Elizebeth to continue with meal plan and their weight loss efforts to further improve this condition.  Thus, we will need to monitor levels regularly (every 3-4 mo on average) to keep levels within normal limits and prevent over supplementation.   Alcohol use Assessment: Condition is not optimized. Pt endorses drinking 6-8 ounces of wine per night and more on the weekend. I had a long discussion with pt about how fatty carbs and sugar alcohols are negatively affecting her Triglyceride levels.   Plan: Nanna agreed to reduce her alcohol intake to half  and following the meal plan more closely. Will continue to monitor condition closely.   TREATMENT PLAN FOR OBESITY: BMI 30.0-30.9,adult  Obesity, current BMI 30.35 -starting bmi 31.09/date 06/30/22 Assessment: Denee Lundvall is here to discuss her progress with her obesity treatment plan along with follow-up of her obesity related diagnoses. See Medical Weight Management Flowsheet for complete bioelectrical impedance results.  Condition is improving. Biometric data collected today, was reviewed with patient.   Since last office visit on 02/11/23 patient's  Muscle mass has increased by 3.6 lb. Fat mass has decreased by 4.6 lb. Total body water has decreased by 3.8 lb.  Counseling done on how various foods will affect these numbers and how to maximize success  Total lbs lost to date: 4  Total weight loss percentage to date: 2.35   Plan: Continue with the Category 2 meal plan with breakfast and lunch options.  Behavioral Intervention Additional resources provided today: patient declined Evidence-based interventions for health behavior change were utilized today including the discussion of self monitoring techniques, problem-solving barriers and SMART goal setting techniques.   Regarding patient's less desirable eating habits and patterns, we employed the technique of small changes.   Pt will specifically work on: comparing the fat between chicken breast and lean ground beef when grocery shopping, cutting her alcohol intake to half, and cutting back on carbs by following meal plan more closely  for next visit.    Recommended Physical Activity Goals  Delanna has been advised to slowly work up to 150 minutes of moderate intensity aerobic activity a week and strengthening exercises 2-3 times per week for cardiovascular health, weight loss maintenance and preservation of muscle mass.   She has agreed to Continue current level of physical activity   FOLLOW UP: Return in 3-4 wks. She was informed of the importance of frequent follow up visits to maximize her success with intensive lifestyle modifications for her multiple health conditions.  Subjective:   Chief complaint: Obesity Sairah is here to discuss her progress with her obesity treatment plan. She is on the the Category 2 Plan with breakfast and lunch options  and states she is following her eating plan approximately 85% of the time. She states she is walking at work.   Interval History:  Janith Francoeur is here for a follow up office visit. Since last OV, Shaianne has been doing well. She endorses following the meal plan more closely and doing more meal prepping. Overall, her hunger and cravings are well controlled.  Pharmacotherapy for weight loss: She is currently taking  Metformin  for medical weight loss.  Denies side effects.    Review of Systems:  Pertinent positives were addressed with patient today.  Reviewed by clinician on day of visit: allergies, medications, problem list, medical history, surgical history, family history, social history, and previous encounter notes.  Weight Summary and Biometrics   Weight Lost Since Last Visit: 1lb  Weight Gained Since Last Visit: 0lb    Vitals Temp: 97.7 F (36.5 C) BP: 123/79 Pulse Rate: 85 SpO2: 96 %   Anthropometric Measurements Height: 5\' 2"  (1.575  m) Weight: 166 lb (75.3 kg) BMI (Calculated): 30.35 Weight at Last Visit: 167lb Weight Lost Since Last Visit: 1lb Weight Gained Since Last Visit: 0lb Starting Weight: 170lb Total Weight Loss (lbs): 8 lb (3.629 kg) Peak Weight: 174lb   Body Composition  Body Fat %: 39 % Fat Mass (lbs): 64.8 lbs Muscle Mass (lbs): 96.4 lbs Total Body Water (lbs): 64.6 lbs Visceral Fat Rating : 11   Other Clinical Data Fasting: no Labs: no Today's Visit #: 10 Starting Date: 07/01/23   Objective:   PHYSICAL EXAM: Blood pressure 123/79, pulse 85, temperature 97.7 F (36.5 C), height 5\' 2"  (1.575 m), weight 166 lb (75.3 kg), SpO2 96 %. Body mass index is 30.36 kg/m.  General: Well Developed, well nourished, and in no acute distress.  HEENT: Normocephalic, atraumatic Skin: Warm and dry, cap RF less 2 sec, good turgor Chest:  Normal excursion, shape, no gross abn Respiratory: speaking in full sentences, no conversational dyspnea NeuroM-Sk: Ambulates w/o assistance, moves * 4 Psych: A and O *3, insight good, mood-full  DIAGNOSTIC DATA REVIEWED:  BMET    Component Value Date/Time   NA 137 02/05/2023 0828   NA 141 06/30/2022 1229   K 4.0 02/05/2023 0828   CL 101 02/05/2023 0828   CO2 25 02/05/2023 0828   GLUCOSE 124 (H) 02/05/2023 0828   BUN 19 02/05/2023 0828   BUN 14 06/30/2022 1229   CREATININE 0.67 02/05/2023 0828   CALCIUM 9.6 02/05/2023 0828   GFRNONAA >60 08/17/2019 1027   GFRAA >60 08/17/2019 1027   Lab Results  Component Value Date   HGBA1C 6.2 02/05/2023   HGBA1C 6.1 05/01/2009   Lab Results  Component Value Date   INSULIN 14.7 06/30/2022   Lab Results  Component Value Date   TSH 0.942 06/30/2022   CBC    Component Value Date/Time   WBC 6.0 06/30/2022 1229   WBC 6.1 08/12/2021 0757   RBC 4.71 06/30/2022 1229   RBC 4.63 08/12/2021 0757   HGB 14.8 06/30/2022 1229   HGB 15.1 06/12/2010 1439   HCT 43.7 06/30/2022 1229   HCT 43.2 06/12/2010 1439   PLT 251  06/30/2022 1229   MCV 93 06/30/2022 1229   MCV 94.5 06/12/2010 1439   MCH 31.4 06/30/2022 1229   MCH 31.7 08/17/2019 1027   MCHC 33.9 06/30/2022 1229   MCHC 34.1 08/12/2021 0757   RDW 12.1 06/30/2022 1229   RDW 12.6 06/12/2010 1439   Iron Studies No results found for: "IRON", "TIBC", "  FERRITIN", "IRONPCTSAT" Lipid Panel     Component Value Date/Time   CHOL 190 02/05/2023 0828   CHOL 216 (H) 06/30/2022 1229   TRIG 335.0 (H) 02/05/2023 0828   HDL 47.10 02/05/2023 0828   HDL 52 06/30/2022 1229   CHOLHDL 4 02/05/2023 0828   VLDL 67.0 (H) 02/05/2023 0828   LDLCALC 110 (H) 06/30/2022 1229   LDLDIRECT 87.0 02/05/2023 0828   Hepatic Function Panel     Component Value Date/Time   PROT 7.4 02/05/2023 0828   PROT 6.9 06/30/2022 1229   ALBUMIN 4.5 02/05/2023 0828   ALBUMIN 4.6 06/30/2022 1229   AST 22 02/05/2023 0828   ALT 29 02/05/2023 0828   ALKPHOS 69 02/05/2023 0828   BILITOT 0.7 02/05/2023 0828   BILITOT 0.4 06/30/2022 1229   BILIDIR 0.0 09/27/2013 0822      Component Value Date/Time   TSH 0.942 06/30/2022 1229   Nutritional Lab Results  Component Value Date   VD25OH 42.96 02/05/2023   VD25OH 48.2 11/12/2022   VD25OH 42.5 06/30/2022    Attestations:   I, Special Puri, acting as a Stage manager for [employer], DO., have compiled all relevant documentation for today's office visit on behalf of Thomasene Lot, DO, while in the presence of [employer], DO.  I have reviewed the above documentation for accuracy and completeness, and I agree with the above. Carlye Grippe, D.O.  The 21st Century Cures Act was signed into law in 2016 which includes the topic of electronic health records.  This provides immediate access to information in MyChart.  This includes consultation notes, operative notes, office notes, lab results and pathology reports.  If you have any questions about what you read please let us know at your next visit so we can discuss your concerns  and take corrective action if need be.  We are right here with you.

## 2023-03-03 ENCOUNTER — Other Ambulatory Visit (INDEPENDENT_AMBULATORY_CARE_PROVIDER_SITE_OTHER): Payer: Self-pay | Admitting: Family Medicine

## 2023-03-03 DIAGNOSIS — R7303 Prediabetes: Secondary | ICD-10-CM

## 2023-03-11 ENCOUNTER — Ambulatory Visit (INDEPENDENT_AMBULATORY_CARE_PROVIDER_SITE_OTHER): Payer: Medicare Other | Admitting: Family Medicine

## 2023-03-25 ENCOUNTER — Ambulatory Visit (INDEPENDENT_AMBULATORY_CARE_PROVIDER_SITE_OTHER): Payer: Medicare Other | Admitting: Family Medicine

## 2023-03-25 ENCOUNTER — Other Ambulatory Visit (INDEPENDENT_AMBULATORY_CARE_PROVIDER_SITE_OTHER): Payer: Self-pay | Admitting: Family Medicine

## 2023-03-25 VITALS — BP 112/75 | HR 65 | Temp 97.5°F | Ht 62.0 in | Wt 165.0 lb

## 2023-03-25 DIAGNOSIS — J3089 Other allergic rhinitis: Secondary | ICD-10-CM | POA: Diagnosis not present

## 2023-03-25 DIAGNOSIS — F1090 Alcohol use, unspecified, uncomplicated: Secondary | ICD-10-CM | POA: Diagnosis not present

## 2023-03-25 DIAGNOSIS — R7303 Prediabetes: Secondary | ICD-10-CM | POA: Diagnosis not present

## 2023-03-25 DIAGNOSIS — E669 Obesity, unspecified: Secondary | ICD-10-CM

## 2023-03-25 DIAGNOSIS — Z6830 Body mass index (BMI) 30.0-30.9, adult: Secondary | ICD-10-CM

## 2023-03-25 DIAGNOSIS — Z6831 Body mass index (BMI) 31.0-31.9, adult: Secondary | ICD-10-CM

## 2023-03-25 DIAGNOSIS — Z789 Other specified health status: Secondary | ICD-10-CM

## 2023-03-25 MED ORDER — LEVOCETIRIZINE DIHYDROCHLORIDE 5 MG PO TABS: 5.0000 mg | ORAL_TABLET | Freq: Every evening | ORAL | 0 refills | Status: DC

## 2023-03-25 MED ORDER — METFORMIN HCL ER 500 MG PO TB24: 500.0000 mg | ORAL_TABLET | Freq: Every day | ORAL | 0 refills | Status: DC

## 2023-03-25 NOTE — Progress Notes (Signed)
Desiree Andrews, D.O.  ABFM, ABOM Specializing in Clinical Bariatric Medicine  Office located at: [address] [place], [place]  [address]     Assessment and Plan:   Medications Discontinued During This Encounter  Medication Reason   metFORMIN (GLUCOPHAGE) 500 MG tablet    levocetirizine (XYZAL) 5 MG tablet Reorder     Meds ordered this encounter  Medications   levocetirizine (XYZAL) 5 MG tablet    Sig: Take 1 tablet (5 mg total) by mouth every evening.    Dispense:  30 tablet    Refill:  0   metFORMIN (GLUCOPHAGE-XR) 500 MG 24 hr tablet    Sig: Take 1 tablet (500 mg total) by mouth daily with lunch.    Dispense:  30 tablet    Refill:  0     Prediabetes Assessment & Plan: Pt is prescribed Metformin 500 mg BID for this condition. Reports taking one tablet in the afternoon, however she notes regularly forgetting to take her second tablet in the pm.  Lab Results  Component Value Date   HGBA1C 6.2 02/05/2023   HGBA1C 6.3 (H) 11/12/2022   HGBA1C 6.3 (H) 06/30/2022   INSULIN 14.7 06/30/2022    To improve med compliance, I will switch pt to Metformin XR 500 mg once daily. Continue to decrease simple carbs/ sugars; increase fiber and proteins -> follow her meal plan. Desiree Andrews will continue to work on weight loss, exercise, via their meal plan we devised to help decrease the risk of progressing to diabetes.   Environmental and seasonal allergies Assessment & Plan: Desiree Andrews reports having seasonal allergies and states that symptoms are pretty well controlled with Xyzal 5 mg daily. She requests a refill today.   Will refill Xyzal today. Pt advised to continue med and our low-inflammatory meal plan. Will continue to monitor condition.   Alcohol use Assessment & Plan: Pt reports that she recently stopped drinking alcohol Monday-Thursday. She drinks Friday-Sunday, however endorses "making a glass of wine last longer".   Desiree Andrews agrees to continue to reduce her alcohol intake  and to follow her meal plan more closely. Will continue to monitor condition expectantly.    TREATMENT PLAN FOR OBESITY: BMI 30.0-30.9,adult- current BMI 30.17 Obesity, current BMI 30.35 -starting bmi 31.09/date 06/30/22 Assessment & Plan:  Desiree Andrews is here to discuss her progress with her obesity treatment plan along with follow-up of her obesity related diagnoses. See Medical Weight Management Flowsheet for complete bioelectrical impedance results.  Condition is not optimized. Biometric data collected today, was reviewed with patient.   Since last office visit on 02/11/23 patient's  Muscle mass has decreased by 1.6 lb. Fat mass has increased by 1.2 lb. Total body water has increased by 2.2 lb.  Counseling done on how various foods will affect these numbers and how to maximize success  Total lbs lost to date: 5 lbs  Total weight loss percentage to date: 2.94%   Continue with the Category 2 meal plan with breakfast and lunch options.   Behavioral Intervention Additional resources provided today:  Healthy Eating Guiding Principles Handout Evidence-based interventions for health behavior change were utilized today including the discussion of self monitoring techniques, problem-solving barriers and SMART goal setting techniques.   Regarding patient's less desirable eating habits and patterns, we employed the technique of small changes.  Pt will specifically work on: continue with prudent nutritional plan for next visit.    Recommended Physical Activity Goals Desiree Andrews has been advised to slowly work  up to 150 minutes of moderate intensity aerobic activity a week and strengthening exercises 2-3 times per week for cardiovascular health, weight loss maintenance and preservation of muscle mass. She has agreed to Continue current level of physical activity   FOLLOW UP: Return in about 4 weeks (around 04/22/2023). She was informed of the importance of frequent follow up visits to maximize her  success with intensive lifestyle modifications for her multiple health conditions.  Subjective:   Chief complaint: Obesity Desiree Andrews is here to discuss her progress with her obesity treatment plan. She is on the Category 2 Plan with B/L options and states she is following her eating plan approximately 50% of the time. She states she is not exercising.  Interval History:  Desiree Andrews is here for a follow up office visit. Since last OV, Desiree Andrews has been doing well. Reports cutting back on her alcohol intake. She has no complaints with her meal plan. Hunger and cravings are well controlled.   Pharmacotherapy for weight loss: She is prescribed  Wellbutrin XL 150 mg daily and Metformin 500 mg bid  for hunger and cravings. Denies side effects.    Review of Systems:  Pertinent positives were addressed with patient today.  Reviewed by clinician on day of visit: allergies, medications, problem list, medical history, surgical history, family history, social history, and previous encounter notes.  Weight Summary and Biometrics   Weight Lost Since Last Visit: 1lb  Weight Gained Since Last Visit: 0lb    Vitals Temp: (!) 97.5 F (36.4 C) BP: 112/75 Pulse Rate: 65 SpO2: 92 %   Anthropometric Measurements Height: 5\' 2"  (1.575 m) Weight: 165 lb (74.8 kg) BMI (Calculated): 30.17 Weight at Last Visit: 166lb Weight Lost Since Last Visit: 1lb Weight Gained Since Last Visit: 0lb Starting Weight: 170lb Total Weight Loss (lbs): 5 lb (2.268 kg) Peak Weight: 174lb   Body Composition  Body Fat %: 39.8 % Fat Mass (lbs): 66 lbs Muscle Mass (lbs): 94.8 lbs Total Body Water (lbs): 66.8 lbs Visceral Fat Rating : 11   Other Clinical Data Fasting: no Labs: no Today's Visit #: 11 Starting Date: 07/01/23   Objective:   PHYSICAL EXAM: Blood pressure 112/75, pulse 65, temperature (!) 97.5 F (36.4 C), height 5\' 2"  (1.575 m), weight 165 lb (74.8 kg), SpO2 92%. Body mass index is 30.18  kg/m.  General: Well Developed, well nourished, and in no acute distress.  HEENT: Normocephalic, atraumatic Skin: Warm and dry, cap RF less 2 sec, good turgor Chest:  Normal excursion, shape, no gross abn Respiratory: speaking in full sentences, no conversational dyspnea NeuroM-Sk: Ambulates w/o assistance, moves * 4 Psych: A and O *3, insight good, mood-full  DIAGNOSTIC DATA REVIEWED:  BMET    Component Value Date/Time   NA 137 02/05/2023 0828   NA 141 06/30/2022 1229   K 4.0 02/05/2023 0828   CL 101 02/05/2023 0828   CO2 25 02/05/2023 0828   GLUCOSE 124 (H) 02/05/2023 0828   BUN 19 02/05/2023 0828   BUN 14 06/30/2022 1229   CREATININE 0.67 02/05/2023 0828   CALCIUM 9.6 02/05/2023 0828   GFRNONAA >60 08/17/2019 1027   GFRAA >60 08/17/2019 1027   Lab Results  Component Value Date   HGBA1C 6.2 02/05/2023   HGBA1C 6.1 05/01/2009   Lab Results  Component Value Date   INSULIN 14.7 06/30/2022   Lab Results  Component Value Date   TSH 0.942 06/30/2022   CBC    Component Value Date/Time  WBC 6.0 06/30/2022 1229   WBC 6.1 08/12/2021 0757   RBC 4.71 06/30/2022 1229   RBC 4.63 08/12/2021 0757   HGB 14.8 06/30/2022 1229   HGB 15.1 06/12/2010 1439   HCT 43.7 06/30/2022 1229   HCT 43.2 06/12/2010 1439   PLT 251 06/30/2022 1229   MCV 93 06/30/2022 1229   MCV 94.5 06/12/2010 1439   MCH 31.4 06/30/2022 1229   MCH 31.7 08/17/2019 1027   MCHC 33.9 06/30/2022 1229   MCHC 34.1 08/12/2021 0757   RDW 12.1 06/30/2022 1229   RDW 12.6 06/12/2010 1439   Iron Studies No results found for: "IRON", "TIBC", "FERRITIN", "IRONPCTSAT" Lipid Panel     Component Value Date/Time   CHOL 190 02/05/2023 0828   CHOL 216 (H) 06/30/2022 1229   TRIG 335.0 (H) 02/05/2023 0828   HDL 47.10 02/05/2023 0828   HDL 52 06/30/2022 1229   CHOLHDL 4 02/05/2023 0828   VLDL 67.0 (H) 02/05/2023 0828   LDLCALC 110 (H) 06/30/2022 1229   LDLDIRECT 87.0 02/05/2023 0828   Hepatic Function Panel      Component Value Date/Time   PROT 7.4 02/05/2023 0828   PROT 6.9 06/30/2022 1229   ALBUMIN 4.5 02/05/2023 0828   ALBUMIN 4.6 06/30/2022 1229   AST 22 02/05/2023 0828   ALT 29 02/05/2023 0828   ALKPHOS 69 02/05/2023 0828   BILITOT 0.7 02/05/2023 0828   BILITOT 0.4 06/30/2022 1229   BILIDIR 0.0 09/27/2013 0822      Component Value Date/Time   TSH 0.942 06/30/2022 1229   Nutritional Lab Results  Component Value Date   VD25OH 42.96 02/05/2023   VD25OH 48.2 11/12/2022   VD25OH 42.5 06/30/2022    Attestations:   I, Desiree Andrews , acting as a Stage manager for [employer], DO., have compiled all relevant documentation for today's office visit on behalf of Desiree Lot, DO, while in the presence of [employer], DO.  I have reviewed the above documentation for accuracy and completeness, and I agree with the above. Desiree Andrews, D.O.  The 21st Century Cures Act was signed into law in 2016 which includes the topic of electronic health records.  This provides immediate access to information in MyChart.  This includes consultation notes, operative notes, office notes, lab results and pathology reports.  If you have any questions about what you read please let us know at your next visit so we can discuss your concerns and take corrective action if need be.  We are right here with you.

## 2023-04-22 ENCOUNTER — Other Ambulatory Visit (INDEPENDENT_AMBULATORY_CARE_PROVIDER_SITE_OTHER): Payer: Self-pay | Admitting: Family Medicine

## 2023-04-22 ENCOUNTER — Ambulatory Visit (INDEPENDENT_AMBULATORY_CARE_PROVIDER_SITE_OTHER): Payer: Medicare Other | Admitting: Family Medicine

## 2023-04-22 ENCOUNTER — Encounter (INDEPENDENT_AMBULATORY_CARE_PROVIDER_SITE_OTHER): Payer: Self-pay | Admitting: Family Medicine

## 2023-04-22 VITALS — BP 126/79 | HR 77 | Temp 98.1°F | Ht 62.0 in | Wt 166.0 lb

## 2023-04-22 DIAGNOSIS — R7303 Prediabetes: Secondary | ICD-10-CM | POA: Diagnosis not present

## 2023-04-22 DIAGNOSIS — Z6830 Body mass index (BMI) 30.0-30.9, adult: Secondary | ICD-10-CM

## 2023-04-22 DIAGNOSIS — E559 Vitamin D deficiency, unspecified: Secondary | ICD-10-CM

## 2023-04-22 DIAGNOSIS — E669 Obesity, unspecified: Secondary | ICD-10-CM

## 2023-04-22 DIAGNOSIS — J3089 Other allergic rhinitis: Secondary | ICD-10-CM

## 2023-04-22 MED ORDER — METFORMIN HCL ER 500 MG PO TB24: 500.0000 mg | ORAL_TABLET | Freq: Every day | ORAL | 0 refills | Status: DC

## 2023-04-22 NOTE — Progress Notes (Signed)
Desiree Andrews, D.O.  ABFM, ABOM Specializing in Clinical Bariatric Medicine  Office located at: [address] [place], [place]  [address]     Assessment and Plan:   Medications Discontinued During This Encounter  Medication Reason   metFORMIN (GLUCOPHAGE-XR) 500 MG 24 hr tablet Reorder     Meds ordered this encounter  Medications   metFORMIN (GLUCOPHAGE-XR) 500 MG 24 hr tablet    Sig: Take 1 tablet (500 mg total) by mouth daily with lunch.    Dispense:  30 tablet    Refill:  0    Check fasting blood work next OV   Prediabetes Assessment & Plan: Lab Results  Component Value Date   HGBA1C 6.2 02/05/2023   HGBA1C 6.3 (H) 11/12/2022   HGBA1C 6.3 (H) 06/30/2022   INSULIN 14.7 06/30/2022    Prediabetes treated with Metformin XR 500 mg - tolerating well, denies any N/V/D. Feels that hunger and cravings are better controlled. Will refill Metformin today - no dose change. Continue to decrease simple carbs/ sugars; increase fiber and proteins -> follow her meal plan.    Vitamin D deficiency Assessment & Plan: Lab Results  Component Value Date   VD25OH 42.96 02/05/2023   VD25OH 48.2 11/12/2022   VD25OH 42.5 06/30/2022   Condition treated with OTC Cholecalciferol 2,000 international units every day, tolerating well. C/w their weight loss efforts and Cholecalciferol at current dose. Will recheck levels when deemed appropriate.    BMI 30.0-30.9,adult Obesity, current BMI 30.35 -starting bmi 31.09/date 06/30/22 Assessment & Plan:  Since last office visit on 03/25/23 patient's muscle mass has increased by 0.6 lb. Fat mass has decreased by 0.4 lb. Total body water has decreased by 6.4 lb.  Counseling done on how various foods will affect these numbers and how to maximize success  Total lbs lost to date: 4 lbs  Total weight loss percentage to date: 2.35%  No change to meal plan - see Subjective    - I again reviewed the Category 2 meal plan with the pt as a refresher.    Behavioral Intervention Additional resources provided today: category 2 meal plan information, breakfast options, and lunch options Evidence-based interventions for health behavior change were utilized today including the discussion of self monitoring techniques, problem-solving barriers and SMART goal setting techniques.   Regarding patient's less desirable eating habits and patterns, we employed the technique of small changes.  Goal: Pt promises to diligently follow her meal plan for the next 2 wks  FOLLOW UP: Return in about 2 weeks (around 05/06/2023). She was informed of the importance of frequent follow up visits to maximize her success with intensive lifestyle modifications for her multiple health conditions.  Subjective:   Chief complaint: Obesity [place] is here to discuss her progress with her obesity treatment plan. She is on the Category 2 Plan with B/L options and states she is following her eating plan approximately 80% of the time. She states she is walking at work, but not formal exercise.   Interval History:  [name] is here for a follow up office visit. Since last OV,  Deyci has been doing well. Her birthday was [birth year] and reports going out to eat and having a pleasant time. Hunger and cravings are stable.   Pharmacotherapy for weight loss: She is currently taking  Metformin-XR 500 mg daily  for medical weight loss.  Denies side effects.    Review of Systems:  Pertinent positives were addressed with patient today.  Reviewed by clinician on day of visit: allergies, medications, problem list, medical history, surgical history, family history, social history, and previous encounter notes.  Weight Summary and Biometrics   Weight Lost Since Last Visit: 0lb  Weight Gained Since Last Visit: 1lb   Vitals Temp: 98.1 F (36.7 C) BP: 126/79 Pulse Rate: 77 SpO2: 99 %   Anthropometric Measurements Height: 5\' 2"  (1.575 m) Weight: 166 lb (75.3 kg) BMI  (Calculated): 30.35 Weight at Last Visit: 165lb Weight Lost Since Last Visit: 0lb Weight Gained Since Last Visit: 1lb Starting Weight: 170lb Total Weight Loss (lbs): 4 lb (1.814 kg) Peak Weight: 174lb   Body Composition  Body Fat %: 39.5 % Fat Mass (lbs): 65.6 lbs Muscle Mass (lbs): 95.4 lbs Total Body Water (lbs): 40.4 lbs Visceral Fat Rating : 11   Other Clinical Data Fasting: no Labs: no Today's Visit #: 12 Starting Date: 07/01/23    Objective:   PHYSICAL EXAM: Blood pressure 126/79, pulse 77, temperature 98.1 F (36.7 C), height 5\' 2"  (1.575 m), weight 166 lb (75.3 kg), SpO2 99%. Body mass index is 30.36 kg/m.  General: Well Developed, well nourished, and in no acute distress.  HEENT: Normocephalic, atraumatic Skin: Warm and dry, cap RF less 2 sec, good turgor Chest:  Normal excursion, shape, no gross abn Respiratory: speaking in full sentences, no conversational dyspnea NeuroM-Sk: Ambulates w/o assistance, moves * 4 Psych: A and O *3, insight good, mood-full  DIAGNOSTIC DATA REVIEWED:  BMET    Component Value Date/Time   NA 137 02/05/2023 0828   NA 141 06/30/2022 1229   K 4.0 02/05/2023 0828   CL 101 02/05/2023 0828   CO2 25 02/05/2023 0828   GLUCOSE 124 (H) 02/05/2023 0828   BUN 19 02/05/2023 0828   BUN 14 06/30/2022 1229   CREATININE 0.67 02/05/2023 0828   CALCIUM 9.6 02/05/2023 0828   GFRNONAA >60 08/17/2019 1027   GFRAA >60 08/17/2019 1027   Lab Results  Component Value Date   HGBA1C 6.2 02/05/2023   HGBA1C 6.1 05/01/2009   Lab Results  Component Value Date   INSULIN 14.7 06/30/2022   Lab Results  Component Value Date   TSH 0.942 06/30/2022   CBC    Component Value Date/Time   WBC 6.0 06/30/2022 1229   WBC 6.1 08/12/2021 0757   RBC 4.71 06/30/2022 1229   RBC 4.63 08/12/2021 0757   HGB 14.8 06/30/2022 1229   HGB 15.1 06/12/2010 1439   HCT 43.7 06/30/2022 1229   HCT 43.2 06/12/2010 1439   PLT 251 06/30/2022 1229   MCV 93  06/30/2022 1229   MCV 94.5 06/12/2010 1439   MCH 31.4 06/30/2022 1229   MCH 31.7 08/17/2019 1027   MCHC 33.9 06/30/2022 1229   MCHC 34.1 08/12/2021 0757   RDW 12.1 06/30/2022 1229   RDW 12.6 06/12/2010 1439   Iron Studies No results found for: "IRON", "TIBC", "FERRITIN", "IRONPCTSAT" Lipid Panel     Component Value Date/Time   CHOL 190 02/05/2023 0828   CHOL 216 (H) 06/30/2022 1229   TRIG 335.0 (H) 02/05/2023 0828   HDL 47.10 02/05/2023 0828   HDL 52 06/30/2022 1229   CHOLHDL 4 02/05/2023 0828   VLDL 67.0 (H) 02/05/2023 0828   LDLCALC 110 (H) 06/30/2022 1229   LDLDIRECT 87.0 02/05/2023 0828   Hepatic Function Panel     Component Value Date/Time   PROT 7.4 02/05/2023 0828   PROT 6.9 06/30/2022 1229   ALBUMIN 4.5 02/05/2023 1610  ALBUMIN 4.6 06/30/2022 1229   AST 22 02/05/2023 0828   ALT 29 02/05/2023 0828   ALKPHOS 69 02/05/2023 0828   BILITOT 0.7 02/05/2023 0828   BILITOT 0.4 06/30/2022 1229   BILIDIR 0.0 09/27/2013 0822      Component Value Date/Time   TSH 0.942 06/30/2022 1229   Nutritional Lab Results  Component Value Date   VD25OH 42.96 02/05/2023   VD25OH 48.2 11/12/2022   VD25OH 42.5 06/30/2022    Attestations:   I, Special Puri, acting as a Stage manager for [employer], DO., have compiled all relevant documentation for today's office visit on behalf of Thomasene Lot, DO, while in the presence of [employer], DO.  I have reviewed the above documentation for accuracy and completeness, and I agree with the above. Desiree Andrews, D.O.  The 21st Century Cures Act was signed into law in 2016 which includes the topic of electronic health records.  This provides immediate access to information in MyChart.  This includes consultation notes, operative notes, office notes, lab results and pathology reports.  If you have any questions about what you read please let us know at your next visit so we can discuss your concerns and take corrective action  if need be.  We are right here with you.

## 2023-05-06 ENCOUNTER — Ambulatory Visit (INDEPENDENT_AMBULATORY_CARE_PROVIDER_SITE_OTHER): Payer: Medicare Other | Admitting: Family Medicine

## 2023-05-26 ENCOUNTER — Ambulatory Visit (INDEPENDENT_AMBULATORY_CARE_PROVIDER_SITE_OTHER): Payer: Medicare Other | Admitting: Family Medicine

## 2023-06-05 ENCOUNTER — Other Ambulatory Visit (INDEPENDENT_AMBULATORY_CARE_PROVIDER_SITE_OTHER): Payer: Self-pay | Admitting: Family Medicine

## 2023-06-08 ENCOUNTER — Ambulatory Visit: Payer: Medicare Other | Admitting: Dermatology

## 2023-06-08 ENCOUNTER — Encounter: Payer: Self-pay | Admitting: Dermatology

## 2023-06-08 VITALS — BP 110/69 | HR 81

## 2023-06-08 DIAGNOSIS — D485 Neoplasm of uncertain behavior of skin: Secondary | ICD-10-CM

## 2023-06-08 DIAGNOSIS — Z86018 Personal history of other benign neoplasm: Secondary | ICD-10-CM

## 2023-06-08 DIAGNOSIS — D225 Melanocytic nevi of trunk: Secondary | ICD-10-CM

## 2023-06-08 DIAGNOSIS — D492 Neoplasm of unspecified behavior of bone, soft tissue, and skin: Secondary | ICD-10-CM

## 2023-06-08 DIAGNOSIS — L814 Other melanin hyperpigmentation: Secondary | ICD-10-CM

## 2023-06-08 DIAGNOSIS — Z1283 Encounter for screening for malignant neoplasm of skin: Secondary | ICD-10-CM

## 2023-06-08 DIAGNOSIS — L900 Lichen sclerosus et atrophicus: Secondary | ICD-10-CM

## 2023-06-08 DIAGNOSIS — D1801 Hemangioma of skin and subcutaneous tissue: Secondary | ICD-10-CM | POA: Diagnosis not present

## 2023-06-08 DIAGNOSIS — W908XXA Exposure to other nonionizing radiation, initial encounter: Secondary | ICD-10-CM

## 2023-06-08 DIAGNOSIS — D239 Other benign neoplasm of skin, unspecified: Secondary | ICD-10-CM

## 2023-06-08 DIAGNOSIS — L6612 Frontal fibrosing alopecia: Secondary | ICD-10-CM

## 2023-06-08 DIAGNOSIS — L304 Erythema intertrigo: Secondary | ICD-10-CM

## 2023-06-08 DIAGNOSIS — L821 Other seborrheic keratosis: Secondary | ICD-10-CM

## 2023-06-08 DIAGNOSIS — L578 Other skin changes due to chronic exposure to nonionizing radiation: Secondary | ICD-10-CM

## 2023-06-08 DIAGNOSIS — D229 Melanocytic nevi, unspecified: Secondary | ICD-10-CM

## 2023-06-08 MED ORDER — CLOBETASOL PROPIONATE 0.05 % EX OINT: TOPICAL_OINTMENT | CUTANEOUS | 1 refills | Status: DC

## 2023-06-08 MED ORDER — KETOCONAZOLE 2 % EX CREA: TOPICAL_CREAM | CUTANEOUS | 0 refills | Status: DC

## 2023-06-08 NOTE — Progress Notes (Signed)
Follow-Up Visit   Subjective  Desiree Andrews is a 67 y.o. female who presents for the following: Skin Cancer Screening and Full Body Skin Exam - previously seen by [facility], hx dysplastic nevi, and lichen sclerosus of the labia for which she uses Clobetasol ointment, and would like refills. She uses the Clobetasol sparing PRN flares and not on a regular basis.   The patient presents for Total-Body Skin Exam (TBSE) for skin cancer screening and mole check. The patient has spots, moles and lesions to be evaluated, some may be new or changing and the patient may have concern these could be cancer.  The following portions of the chart were reviewed this encounter and updated as appropriate: medications, allergies, medical history  Review of Systems:  No other skin or systemic complaints except as noted in HPI or Assessment and Plan.  Objective  Well appearing patient in no apparent distress; mood and affect are within normal limits.  A full examination was performed including scalp, head, eyes, ears, nose, lips, neck, chest, axillae, abdomen, back, buttocks, bilateral upper extremities, bilateral lower extremities, hands, feet, fingers, toes, fingernails, and toenails. All findings within normal limits unless otherwise noted below.   Relevant physical exam findings are noted in the Assessment and Plan.  L upper back lat 4.5 mm irregular brown macule.       L upper back med 6.5 mm irregular brown macule.   Left Buttock 8.0 mm irregular brown macule.    Assessment & Plan   SKIN CANCER SCREENING PERFORMED TODAY.  ACTINIC DAMAGE - Chronic condition, secondary to cumulative UV/sun exposure - diffuse scaly erythematous macules with underlying dyspigmentation - Recommend daily broad spectrum sunscreen SPF 30+ to sun-exposed areas, reapply every 2 hours as needed.  - Staying in the shade or wearing long sleeves, sun glasses (UVA+UVB protection) and wide  brim hats (4-inch brim around the entire circumference of the hat) are also recommended for sun protection.  - Call for new or changing lesions.  LENTIGINES, SEBORRHEIC KERATOSES, HEMANGIOMAS - Benign normal skin lesions - Benign-appearing - Call for any changes  MELANOCYTIC NEVI - Tan-brown and/or pink-flesh-colored symmetric macules and papules - Benign appearing on exam today - Observation - Call clinic for new or changing moles - Recommend daily use of broad spectrum spf 30+ sunscreen to sun-exposed areas.   HISTORY OF DYSPLASTIC NEVI - mid back No evidence of recurrence today Recommend regular full body skin exams Recommend daily broad spectrum sunscreen SPF 30+ to sun-exposed areas, reapply every 2 hours as needed.  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 Currently well-controlled.  Lichen sclerosus is a chronic inflammatory condition of unknown cause that frequently involves the vaginal area and less commonly extragenital skin, and is NOT sexually transmitted. It frequently causes symptoms of pain and burning.  It requires regular monitoring and treatment with topical steroids to minimize inflammation and to reduce risk of scarring. There is also a risk of cancer in the vaginal area which is very low if inflammation is well controlled. Regular checks of the area are recommended. Please call if you notice any new or changing spots within this area.  Treatment Plan: Continue Clobetasol ointment to aa's QD-BID PRN flares. Topical steroids (such as triamcinolone, fluocinolone, fluocinonide, mometasone, clobetasol, halobetasol, betamethasone, hydrocortisone) can cause thinning and lightening of the skin if they are  used for too long in the same area. Your physician has selected the right strength medicine for your problem and area affected  on the body. Please use your medication only as directed by your physician to prevent side effects.   Neoplasm of uncertain behavior of skin (3) L upper back lat  Epidermal / dermal shaving  Lesion diameter (cm):  0.4 Informed consent: discussed and consent obtained   Timeout: patient name, date of birth, surgical site, and procedure verified   Procedure prep:  Patient was prepped and draped in usual sterile fashion Prep type:  Isopropyl alcohol Anesthesia: the lesion was anesthetized in a standard fashion   Anesthetic:  1% lidocaine w/ epinephrine 1-100,000 buffered w/ 8.4% NaHCO3 Instrument used: flexible razor blade   Hemostasis achieved with: pressure, aluminum chloride and electrodesiccation   Outcome: patient tolerated procedure well   Post-procedure details: sterile dressing applied and wound care instructions given   Dressing type: bandage and petrolatum    Specimen 1 - Surgical pathology Differential Diagnosis: D48.5 r/o dysplastic nevus   Check Margins: Yes  L upper back med  Epidermal / dermal shaving  Lesion diameter (cm):  0.6 Informed consent: discussed and consent obtained   Timeout: patient name, date of birth, surgical site, and procedure verified   Procedure prep:  Patient was prepped and draped in usual sterile fashion Prep type:  Isopropyl alcohol Anesthesia: the lesion was anesthetized in a standard fashion   Anesthetic:  1% lidocaine w/ epinephrine 1-100,000 buffered w/ 8.4% NaHCO3 Instrument used: flexible razor blade   Hemostasis achieved with: pressure, aluminum chloride and electrodesiccation   Outcome: patient tolerated procedure well   Post-procedure details: sterile dressing applied and wound care instructions given   Dressing type: bandage and petrolatum    Specimen 2 - Surgical pathology Differential Diagnosis: D48.5 r/o dysplastic nevus  Check Margins: Yes  Left Buttock  Epidermal / dermal shaving  Informed consent: discussed and  consent obtained   Timeout: patient name, date of birth, surgical site, and procedure verified   Procedure prep:  Patient was prepped and draped in usual sterile fashion Prep type:  Isopropyl alcohol Anesthesia: the lesion was anesthetized in a standard fashion   Anesthetic:  1% lidocaine w/ epinephrine 1-100,000 buffered w/ 8.4% NaHCO3 Instrument used: flexible razor blade   Hemostasis achieved with: pressure, aluminum chloride and electrodesiccation   Outcome: patient tolerated procedure well   Post-procedure details: sterile dressing applied and wound care instructions given   Dressing type: bandage and petrolatum    Specimen 3 - Surgical pathology Differential Diagnosis: D48.5 r/o dysplastic nevus  Check Margins: Yes  Frontal fibrosing alopecia Scalp  Pt to contact office to schedule an appointment for evaluation if bothersome.    Acrochordons (Skin Tags) - Fleshy, skin-colored pedunculated papules - Benign appearing.  - Observe. - If desired, they can be removed with an in office procedure that is not covered by insurance. - Please call the clinic if you notice any new or changing lesions.  DERMATOFIBROMA Exam: Firm pink/brown papulenodule with dimple sign. Treatment Plan: A dermatofibroma is a benign growth possibly related to trauma, such as an insect bite, cut from shaving, or inflamed acne-type bump.  Treatment options to remove include shave or excision with resulting scar and risk of recurrence.  Since benign-appearing and not bothersome, will observe for now.   INTERTRIGO Exam Erythematous macerated patches  Chronic and persistent condition with duration or expected duration over one year. Condition is bothersome/symptomatic for patient. Currently flared.  Intertrigo is a chronic recurrent rash that occurs in skin fold areas that may be associated with friction; heat; moisture; yeast; fungus; and bacteria.  It is exacerbated by increased movement / activity;  sweating; and higher atmospheric temperature.  Treatment Plan Start Ketoconazole 2% cream to aa's QD PRN.   Return in about 1 year (around 06/07/2024) for TBSE - hx dysplastic nevus, lichen sclerosus .  Maylene Roes, CMA, am acting as scribe for [employer], DO .   Documentation: I have reviewed the above documentation for accuracy and completeness, and I agree with the above.  [name], DO

## 2023-06-08 NOTE — Patient Instructions (Addendum)
Wound Care Instructions  Cleanse wound gently with soap and water once a day then pat dry with clean gauze. Apply a thin coat of Petrolatum (petroleum jelly, "Vaseline") over the wound (unless you have an allergy to this). We recommend that you use a new, sterile tube of Vaseline. Do not pick or remove scabs. Do not remove the yellow or white "healing tissue" from the base of the wound.  Cover the wound with fresh, clean, nonstick gauze and secure with paper tape. You may use Band-Aids in place of gauze and tape if the wound is small enough, but would recommend trimming much of the tape off as there is often too much. Sometimes Band-Aids can irritate the skin.  You should call the office for your biopsy report after 1 week if you have not already been contacted.  If you experience any problems, such as abnormal amounts of bleeding, swelling, significant bruising, significant pain, or evidence of infection, please call the office immediately.  FOR ADULT SURGERY PATIENTS: If you need something for pain relief you may take 1 extra strength Tylenol (acetaminophen) AND 2 Ibuprofen (200mg  each) together every 4 hours as needed for pain. (do not take these if you are allergic to them or if you have a reason you should not take them.) Typically, you may only need pain medication for 1 to 3 days.         Due to recent changes in healthcare laws, you may see results of your pathology and/or laboratory studies on MyChart before the doctors have had a chance to review them. We understand that in some cases there may be results that are confusing or concerning to you. Please understand that not all results are received at the same time and often the doctors may need to interpret multiple results in order to provide you with the best plan of care or course of treatment. Therefore, we ask that you please give [place] 2 business days to thoroughly review all your results before contacting the office for clarification.  Should we see a critical lab result, you will be contacted sooner.   If You Need Anything After Your Visit  If you have any questions or concerns for your doctor, please call our main line at [phone] and press option 4 to reach your doctor's medical assistant. If no one answers, please leave a voicemail as directed and we will return your call as soon as possible. Messages left after 4 pm will be answered the following business day.   You may also send [place] a message via MyChart. We typically respond to MyChart messages within 1-2 business days.  For prescription refills, please ask your pharmacy to contact our office. Our fax number is [phone].  If you have an urgent issue when the clinic is closed that cannot wait until the next business day, you can page your doctor at the number below.    Please note that while we do our best to be available for urgent issues outside of office hours, we are not available 24/7.   If you have an urgent issue and are unable to reach [place], you may choose to seek medical care at your doctor's office, retail clinic, urgent care center, or emergency room.  If you have a medical emergency, please immediately call 911 or go to the emergency department.  Pager Numbers  - Dr. Gwen Pounds: [phone]  - Dr. Roseanne Reno: [phone]  - Dr. Katrinka Blazing: [phone]   In the event of inclement weather, please  call our main line at [phone] for an update on the status of any delays or closures.  Dermatology Medication Tips: Please keep the boxes that topical medications come in in order to help keep track of the instructions about where and how to use these. Pharmacies typically print the medication instructions only on the boxes and not directly on the medication tubes.   If your medication is too expensive, please contact our office at [phone] option 4 or send [place] a message through MyChart.   We are unable to tell what your co-pay for medications will be  in advance as this is different depending on your insurance coverage. However, we may be able to find a substitute medication at lower cost or fill out paperwork to get insurance to cover a needed medication.   If a prior authorization is required to get your medication covered by your insurance company, please allow [place] 1-2 business days to complete this process.  Drug prices often vary depending on where the prescription is filled and some pharmacies may offer cheaper prices.  The website www.goodrx.com contains coupons for medications through different pharmacies. The prices here do not account for what the cost may be with help from insurance (it may be cheaper with your insurance), but the website can give you the price if you did not use any insurance.  - You can print the associated coupon and take it with your prescription to the pharmacy.  - You may also stop by our office during regular business hours and pick up a GoodRx coupon card.  - If you need your prescription sent electronically to a different pharmacy, notify our office through [facility] or by phone at [phone] option 4.     Si Usted Necesita Algo Despus de Su Visita  Tambin puede enviarnos un mensaje a travs de Clinical cytogeneticist. Por lo general respondemos a los mensajes de MyChart en el transcurso de 1 a 2 das hbiles.  Para renovar recetas, por favor pida a su farmacia que se ponga en contacto con nuestra oficina. Annie Sable de fax es [place] [phone].  Si tiene un asunto urgente cuando la clnica est cerrada y que no puede esperar hasta el siguiente da hbil, puede llamar/localizar a su doctor(a) al nmero que aparece a continuacin.   Por favor, tenga en cuenta que aunque hacemos todo lo posible para estar disponibles para asuntos urgentes fuera del horario de [place], no estamos disponibles las 24 horas del da, los 7 [address]  [address] de la [place].   Si tiene un problema urgente y no puede comunicarse con nosotros,  puede optar por buscar atencin mdica  en el consultorio de su doctor(a), en una clnica privada, en un centro de atencin urgente o en una sala de emergencias.  Si tiene Engineer, drilling, por favor llame inmediatamente al 911 o vaya a la sala de emergencias.  Nmeros de bper  - Dr. Gwen Pounds: [phone]  - Dra. Roseanne Reno: [phone]  - Dr. Katrinka Blazing: [phone]   En caso de inclemencias del tiempo, por favor llame a Lacy Duverney principal al [phone] para una actualizacin sobre el Matador de cualquier retraso o cierre.  Consejos para la medicacin en dermatologa: Por favor, guarde las cajas en las que vienen los medicamentos de uso tpico para ayudarle a seguir las instrucciones sobre dnde y cmo usarlos. Las farmacias generalmente imprimen las instrucciones del medicamento slo en las cajas y no directamente en los tubos del [place].   Si su medicamento es [employer],  por favor, pngase en contacto con nuestra oficina llamando al [phone] y presione la opcin 4 o envenos un mensaje a travs de Clinical cytogeneticist.   No podemos decirle cul ser su copago por los medicamentos por adelantado ya que esto es diferente dependiendo de la cobertura de su seguro. Sin embargo, es posible que podamos encontrar un medicamento sustituto a Audiological scientist un formulario para que el seguro cubra el medicamento que se considera necesario.   Si se requiere una autorizacin previa para que su compaa de seguros [place] su medicamento, por favor permtanos de 1 a 2 das hbiles para completar [address]  Los precios de los medicamentos varan con frecuencia dependiendo del Environmental consultant de dnde se surte la receta y alguna farmacias pueden ofrecer precios ms baratos.  El sitio web www.goodrx.com tiene cupones para medicamentos de Health and safety inspector. Los precios aqu no tienen en cuenta lo que podra costar con la ayuda del seguro (puede ser ms barato con su seguro), pero el sitio web puede darle  el precio si no utiliz Tourist information centre manager.  - Puede imprimir el cupn correspondiente y llevarlo con su receta a la farmacia.  - Tambin puede pasar por nuestra oficina durante el horario de atencin regular y Education officer, museum una tarjeta de cupones de GoodRx.  - Si necesita que su receta se enve electrnicamente a una farmacia diferente, informe a nuestra oficina a travs de MyChart de [facility] o por telfono llamando al [phone] y presione la opcin 4.

## 2023-06-09 ENCOUNTER — Ambulatory Visit (INDEPENDENT_AMBULATORY_CARE_PROVIDER_SITE_OTHER): Payer: Medicare Other | Admitting: Family Medicine

## 2023-06-09 ENCOUNTER — Other Ambulatory Visit (INDEPENDENT_AMBULATORY_CARE_PROVIDER_SITE_OTHER): Payer: Self-pay | Admitting: Family Medicine

## 2023-06-09 ENCOUNTER — Encounter (INDEPENDENT_AMBULATORY_CARE_PROVIDER_SITE_OTHER): Payer: Self-pay | Admitting: Family Medicine

## 2023-06-09 VITALS — BP 124/78 | HR 78 | Temp 97.9°F | Ht 62.0 in | Wt 165.0 lb

## 2023-06-09 DIAGNOSIS — E559 Vitamin D deficiency, unspecified: Secondary | ICD-10-CM | POA: Diagnosis not present

## 2023-06-09 DIAGNOSIS — Z6830 Body mass index (BMI) 30.0-30.9, adult: Secondary | ICD-10-CM

## 2023-06-09 DIAGNOSIS — R7303 Prediabetes: Secondary | ICD-10-CM | POA: Diagnosis not present

## 2023-06-09 DIAGNOSIS — J3089 Other allergic rhinitis: Secondary | ICD-10-CM | POA: Diagnosis not present

## 2023-06-09 DIAGNOSIS — I10 Essential (primary) hypertension: Secondary | ICD-10-CM | POA: Diagnosis not present

## 2023-06-09 DIAGNOSIS — Z6831 Body mass index (BMI) 31.0-31.9, adult: Secondary | ICD-10-CM

## 2023-06-09 DIAGNOSIS — E669 Obesity, unspecified: Secondary | ICD-10-CM

## 2023-06-09 MED ORDER — METFORMIN HCL ER 500 MG PO TB24
500.0000 mg | ORAL_TABLET | Freq: Every day | ORAL | 0 refills | Status: DC
Start: 2023-06-09 — End: 2023-07-08

## 2023-06-09 MED ORDER — LEVOCETIRIZINE DIHYDROCHLORIDE 5 MG PO TABS
5.0000 mg | ORAL_TABLET | Freq: Every evening | ORAL | 0 refills | Status: DC
Start: 2023-06-09 — End: 2023-07-08

## 2023-06-09 MED ORDER — VITAMIN D (CHOLECALCIFEROL) 25 MCG (1000 UT) PO CAPS: ORAL_CAPSULE | ORAL | Status: DC

## 2023-06-09 NOTE — Progress Notes (Signed)
Carlye Grippe, D.O.  ABFM, ABOM Specializing in Clinical Bariatric Medicine  Office located at: [address] [place], [place]  [address]     Assessment and Plan:   Medications Discontinued During This Encounter  Medication Reason   clobetasol ointment (TEMOVATE) 0.05 %    betamethasone dipropionate (DIPROLENE) 0.05 % ointment     Meds ordered this encounter  Medications   metFORMIN (GLUCOPHAGE-XR) 500 MG 24 hr tablet    Sig: Take 1 tablet (500 mg total) by mouth daily with lunch.    Dispense:  30 tablet    Refill:  0   Vitamin D, Cholecalciferol, 25 MCG (1000 UT) CAPS    Sig: 2,000 IU QD   levocetirizine (XYZAL) 5 MG tablet    Sig: Take 1 tablet (5 mg total) by mouth every evening.    Dispense:  30 tablet    Refill:  0     Come to next visit fasting for lab work.    Pre-diabetes Assessment & Plan: Lab Results  Component Value Date   HGBA1C 6.2 02/05/2023   HGBA1C 6.3 (H) 11/12/2022   HGBA1C 6.3 (H) 06/30/2022   INSULIN 14.7 06/30/2022    Chrisandra Walker Friscia is compliant with Metformin 500 mg once daily with lunch. A1C is stable, was last 6.2 as of 02/05/23. Pt reports loose stools in the past and is unsure if it caused by Metformin. Avoid eating snacks unless she finishes all her meals. Drink zero calorie drinks as an alternative to beverages and sodas. I recommend she use her journal to track what she is eating prior to her episodes of diarrhea. Continue to work on weight loss, exercise, via their meal plan we devised to help decrease the risk of progressing to diabetes. We will recheck her A1C at next visit. Will refill Metformin 500 mg today.    Essential hypertension Assessment & Plan: BP Readings from Last 3 Encounters:  06/09/23 124/78  06/08/23 110/69  04/22/23 126/79   Brodi Walker [place]ingshed BP is at goal today at 124/78. Pt on amlodipine 2.5 mg hydrochlorothiazide 25 mg and lopressor 50 mg. We reviewed labs, LDL has improved but still elevated.  Ideal goal of <150 reviewed with pt. Elevated triglycerides. Educated on effects of alcohol and carbs on triglycerides. Continue to adhere to current regimen and follow up with PCP/specialists. We will continue to monitor symptoms as they relate to the her weight loss journey.   Vitamin D deficiency Assessment & Plan: Lab Results  Component Value Date   VD25OH 42.96 02/05/2023   VD25OH 48.2 11/12/2022   VD25OH 42.5 06/30/2022   Pt taking OTC vitamin D 2000 IU once daily, tolerating well. Reviewed labs from 6/14, vitamin D below goal at 42.96. Ideal vitamin D levels reviewed with pt. Will recheck levels at next visit. We will refill vitamin D 2000 units today.   Environmental and seasonal allergies Assessment & Plan: Pt managing her allergies with Xyzal 5 mg daily. No intolerances or side effects. No new concerns reported. Continue current regimen. We will refill Xyzal 5 mg today.    BMI 30.0-30.9,adult Obesity, current BMI 30.17 -starting bmi 31.09/date 06/30/22 Assessment & Plan: Cayla Sprandel is here to discuss her progress with her obesity treatment plan along with follow-up of her obesity related diagnoses. See Medical Weight Management Flowsheet for complete bioelectrical impedance results.  Since last office visit on 04/22/23 patient's muscle mass has decreased by 1.4 lbs. Fat mass has increased by 0.4 lbs. Total  body water has increased by 22.6 lbs.  Counseling done on how various foods will affect these numbers and how to maximize success  Total lbs lost to date: 5 lbs  Total weight loss percentage to date: -2.94 %  Behavioral Intervention Additional resources provided today:  journaling handout Evidence-based interventions for health behavior change were utilized today including the discussion of self monitoring techniques, problem-solving barriers and SMART goal setting techniques.   Regarding patient's less desirable eating habits and patterns, we employed the  technique of small changes.  Pt will specifically work on: [employer] daily to track calorie and protein intake for next visit.    FOLLOW UP: Return in about 3 weeks (around 06/30/2023). She was informed of the importance of frequent follow up visits to maximize her success with intensive lifestyle modifications for her multiple health conditions.  Subjective:   Chief complaint: Obesity Orene is here to discuss her progress with her obesity treatment plan. She is on the the Category 2 Plan with B/L options and states she is following her eating plan approximately 70% of the time. She states she is not exercising.  Interval History:  Zamani Denny is here for a follow up office visit. Since last OV, she is overall well. States she may eat more bread than she should. Drinks "a lot" of [employer] milk. Has a whole glass before bed and usually has it with special K cereal. She endorses callus on her left foot, which makes walking difficult. 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 Not able to walk much outside of work due to pain. Has not been seen by podiatry.    Pharmacotherapy for weight loss: She is currently taking Wellbutrin and Metformin  for medical weight loss.  Denies side effects.    Review of Systems:  Pertinent positives were addressed with patient today.  Reviewed by clinician on day of visit: allergies, medications, problem list, medical history, surgical history, family history, social history, and previous encounter notes.  Weight Summary and Biometrics   Weight Lost Since Last Visit: 1lb  Weight Gained Since Last Visit: 0lb   Vitals Temp: 97.9 F (36.6 C) BP: 124/78 Pulse Rate: 78 SpO2: 94 %   Anthropometric Measurements Height: 5\' 2"  (1.575 m) Weight: 165 lb (74.8 kg) BMI (Calculated): 30.17 Weight at Last Visit: 166lb Weight Lost Since Last Visit: 1lb Weight Gained Since Last Visit: 0lb Starting Weight:  170lb Total Weight Loss (lbs): 5 lb (2.268 kg) Peak Weight: 174lb   Body Composition  Body Fat %: 40 % Fat Mass (lbs): 66 lbs Muscle Mass (lbs): 94 lbs Total Body Water (lbs): 63 lbs Visceral Fat Rating : 11   Other Clinical Data Fasting: no Labs: no Today's Visit #: 13 Starting Date: 07/01/23    Objective:   PHYSICAL EXAM: Blood pressure 124/78, pulse 78, temperature 97.9 F (36.6 C), height 5\' 2"  (1.575 m), weight 165 lb (74.8 kg), SpO2 94%. Body mass index is 30.18 kg/m.  General: Well Developed, well nourished, and in no acute distress.  HEENT: Normocephalic, atraumatic Skin: Warm and dry, cap RF less 2 sec, good turgor Chest:  Normal excursion, shape, no gross abn Respiratory: speaking in full sentences, no conversational dyspnea NeuroM-Sk: Ambulates w/o assistance, moves * 4 Psych: A and O *3, insight good, mood-full  DIAGNOSTIC DATA REVIEWED:  BMET    Component Value Date/Time   NA 137 02/05/2023 0828   NA 141 06/30/2022 1229  K 4.0 02/05/2023 0828   CL 101 02/05/2023 0828   CO2 25 02/05/2023 0828   GLUCOSE 124 (H) 02/05/2023 0828   BUN 19 02/05/2023 0828   BUN 14 06/30/2022 1229   CREATININE 0.67 02/05/2023 0828   CALCIUM 9.6 02/05/2023 0828   GFRNONAA >60 08/17/2019 1027   GFRAA >60 08/17/2019 1027   Lab Results  Component Value Date   HGBA1C 6.2 02/05/2023   HGBA1C 6.1 05/01/2009   Lab Results  Component Value Date   INSULIN 14.7 06/30/2022   Lab Results  Component Value Date   TSH 0.942 06/30/2022   CBC    Component Value Date/Time   WBC 6.0 06/30/2022 1229   WBC 6.1 08/12/2021 0757   RBC 4.71 06/30/2022 1229   RBC 4.63 08/12/2021 0757   HGB 14.8 06/30/2022 1229   HGB 15.1 06/12/2010 1439   HCT 43.7 06/30/2022 1229   HCT 43.2 06/12/2010 1439   PLT 251 06/30/2022 1229   MCV 93 06/30/2022 1229   MCV 94.5 06/12/2010 1439   MCH 31.4 06/30/2022 1229   MCH 31.7 08/17/2019 1027   MCHC 33.9 06/30/2022 1229   MCHC 34.1 08/12/2021  0757   RDW 12.1 06/30/2022 1229   RDW 12.6 06/12/2010 1439   Iron Studies No results found for: "IRON", "TIBC", "FERRITIN", "IRONPCTSAT" Lipid Panel     Component Value Date/Time   CHOL 190 02/05/2023 0828   CHOL 216 (H) 06/30/2022 1229   TRIG 335.0 (H) 02/05/2023 0828   HDL 47.10 02/05/2023 0828   HDL 52 06/30/2022 1229   CHOLHDL 4 02/05/2023 0828   VLDL 67.0 (H) 02/05/2023 0828   LDLCALC 110 (H) 06/30/2022 1229   LDLDIRECT 87.0 02/05/2023 0828   Hepatic Function Panel     Component Value Date/Time   PROT 7.4 02/05/2023 0828   PROT 6.9 06/30/2022 1229   ALBUMIN 4.5 02/05/2023 0828   ALBUMIN 4.6 06/30/2022 1229   AST 22 02/05/2023 0828   ALT 29 02/05/2023 0828   ALKPHOS 69 02/05/2023 0828   BILITOT 0.7 02/05/2023 0828   BILITOT 0.4 06/30/2022 1229   BILIDIR 0.0 09/27/2013 0822      Component Value Date/Time   TSH 0.942 06/30/2022 1229   Nutritional Lab Results  Component Value Date   VD25OH 42.96 02/05/2023   VD25OH 48.2 11/12/2022   VD25OH 42.5 06/30/2022    Attestations:   I, Isabelle Course, acting as a Stage manager for Thomasene Lot, DO., have compiled all relevant documentation for today's office visit on behalf of Thomasene Lot, DO, while in the presence of [employer], DO.  I have reviewed the above documentation for accuracy and completeness, and I agree with the above. Carlye Grippe, D.O.  The 21st Century Cures Act was signed into law in 2016 which includes the topic of electronic health records.  This provides immediate access to information in MyChart.  This includes consultation notes, operative notes, office notes, lab results and pathology reports.  If you have any questions about what you read please let us know at your next visit so we can discuss your concerns and take corrective action if need be.  We are right here with you.

## 2023-06-10 LAB — SURGICAL PATHOLOGY

## 2023-06-15 NOTE — Progress Notes (Signed)
Hello Hollie,  Please call pt and notify that their bx results showed one of the sites was a severely abnormal mole that requires a full excision in office with Dr Caralyn Guile  Diagnosis 1. Skin , L upper back lat --> No additional tx required DYSPLASTIC COMPOUND NEVUS WITH MODERATE ATYPIA, LIMITED MARGINS FREE 2. Skin , L upper back med --> SE with Dr. Linton Ham NEVUS WITH MODERATE TO SEVERE ATYPIA, MARGIN CLOSE, SEE DESCRIPTION 3. Skin , left buttock --> No additional tx required DYSPLASTIC COMPOUND NEVUS WITH MODERATE ATYPIA, LIMITED MARGINS FREE

## 2023-06-16 ENCOUNTER — Other Ambulatory Visit: Payer: Self-pay | Admitting: Internal Medicine

## 2023-06-17 ENCOUNTER — Telehealth: Payer: Self-pay

## 2023-06-17 NOTE — Telephone Encounter (Signed)
Left message for patient to call office for results/hd 

## 2023-06-17 NOTE — Telephone Encounter (Signed)
-----   Message from [name] sent at 06/15/2023  9:14 AM EDT ----- [name],  Please call pt and notify that their bx results showed one of the sites was a severely abnormal mole that requires a full excision in office with Dr Caralyn Guile  Diagnosis 1. Skin , L upper back lat --> No additional tx required DYSPLASTIC COMPOUND NEVUS WITH MODERATE ATYPIA, LIMITED MARGINS FREE 2. Skin , L upper back med --> SE with Dr. Linton Ham NEVUS WITH MODERATE TO SEVERE ATYPIA, MARGIN CLOSE, SEE DESCRIPTION 3. Skin , left buttock --> No additional tx required DYSPLASTIC COMPOUND NEVUS WITH MODERATE ATYPIA, LIMITED MARGINS FREE

## 2023-06-18 ENCOUNTER — Telehealth: Payer: Self-pay

## 2023-06-18 NOTE — Telephone Encounter (Signed)
-----   Message from [name] sent at 06/15/2023  9:14 AM EDT ----- [name],  Please call pt and notify that their bx results showed one of the sites was a severely abnormal mole that requires a full excision in office with Dr Caralyn Guile  Diagnosis 1. Skin , L upper back lat --> No additional tx required DYSPLASTIC COMPOUND NEVUS WITH MODERATE ATYPIA, LIMITED MARGINS FREE 2. Skin , L upper back med --> SE with Dr. Linton Ham NEVUS WITH MODERATE TO SEVERE ATYPIA, MARGIN CLOSE, SEE DESCRIPTION 3. Skin , left buttock --> No additional tx required DYSPLASTIC COMPOUND NEVUS WITH MODERATE ATYPIA, LIMITED MARGINS FREE

## 2023-06-18 NOTE — Telephone Encounter (Signed)
Advised patient of results and advised that we will call to schedule surgery appointmnet with Dr Paci/hd

## 2023-06-29 NOTE — Telephone Encounter (Signed)
-----   Message from [name] sent at 06/15/2023  9:14 AM EDT ----- [name],  Please call pt and notify that their bx results showed one of the sites was a severely abnormal mole that requires a full excision in office with Dr Caralyn Guile  Diagnosis 1. Skin , L upper back lat --> No additional tx required DYSPLASTIC COMPOUND NEVUS WITH MODERATE ATYPIA, LIMITED MARGINS FREE 2. Skin , L upper back med --> SE with Dr. Linton Ham NEVUS WITH MODERATE TO SEVERE ATYPIA, MARGIN CLOSE, SEE DESCRIPTION 3. Skin , left buttock --> No additional tx required DYSPLASTIC COMPOUND NEVUS WITH MODERATE ATYPIA, LIMITED MARGINS FREE

## 2023-06-29 NOTE — Telephone Encounter (Signed)
Advised patient of results and sent message to admin staff to schedule excision appointment with Dr Paci/hd

## 2023-07-08 ENCOUNTER — Encounter (INDEPENDENT_AMBULATORY_CARE_PROVIDER_SITE_OTHER): Payer: Self-pay | Admitting: Family Medicine

## 2023-07-08 ENCOUNTER — Other Ambulatory Visit (INDEPENDENT_AMBULATORY_CARE_PROVIDER_SITE_OTHER): Payer: Self-pay | Admitting: Family Medicine

## 2023-07-08 ENCOUNTER — Ambulatory Visit (INDEPENDENT_AMBULATORY_CARE_PROVIDER_SITE_OTHER): Payer: Medicare Other | Admitting: Family Medicine

## 2023-07-08 VITALS — BP 125/83 | HR 76 | Temp 98.4°F | Ht 62.0 in | Wt 165.0 lb

## 2023-07-08 DIAGNOSIS — Z6831 Body mass index (BMI) 31.0-31.9, adult: Secondary | ICD-10-CM

## 2023-07-08 DIAGNOSIS — Z6830 Body mass index (BMI) 30.0-30.9, adult: Secondary | ICD-10-CM

## 2023-07-08 DIAGNOSIS — J3089 Other allergic rhinitis: Secondary | ICD-10-CM

## 2023-07-08 DIAGNOSIS — E669 Obesity, unspecified: Secondary | ICD-10-CM

## 2023-07-08 DIAGNOSIS — R7303 Prediabetes: Secondary | ICD-10-CM

## 2023-07-08 DIAGNOSIS — E559 Vitamin D deficiency, unspecified: Secondary | ICD-10-CM | POA: Diagnosis not present

## 2023-07-08 DIAGNOSIS — E781 Pure hyperglyceridemia: Secondary | ICD-10-CM

## 2023-07-08 MED ORDER — LEVOCETIRIZINE DIHYDROCHLORIDE 5 MG PO TABS: 5.0000 mg | ORAL_TABLET | Freq: Every evening | ORAL | 0 refills | Status: AC

## 2023-07-08 MED ORDER — METFORMIN HCL ER 500 MG PO TB24: 500.0000 mg | ORAL_TABLET | Freq: Every day | ORAL | 0 refills | Status: DC

## 2023-07-08 MED ORDER — VITAMIN D (CHOLECALCIFEROL) 25 MCG (1000 UT) PO CAPS
ORAL_CAPSULE | ORAL | Status: AC
Start: 2023-07-08 — End: ?

## 2023-07-08 NOTE — Progress Notes (Signed)
Desiree Andrews, D.O.  ABFM, ABOM Specializing in Clinical Bariatric Medicine  Office located at: [address] [place], [place]  [address]   Assessment and Plan:   FOR THE DISEASE OF OBESITY: BMI 30.0-30.9,adult- current BMI 30.17  Obesity-starting bmi 31.09/date 06/30/22  Since last office visit on 06/09/23 patient's muscle mass has not changed. Fat mass has increased by 0.6 lb. Total body water has increased by 1.2 lb.  Counseling done on how various foods will affect these numbers and how to maximize success  Total lbs lost to date: 5  Total weight loss percentage to date: 2.94%    Recommended Dietary Goals [name] is currently in the action stage of change. As such, her goal is to continue weight management plan.  She has agreed to: switch to journaling with a target of 1300-1400 calories and 90+ grams of protein daily with Category 2 MP as a guide. We made this switch so that Desiree Andrews can have the freedom to make healthy creative foods that she desires.   Behavioral Intervention We discussed the following Behavioral Modification Strategies today: work on tracking and journaling calories using tracking application and continue to work on maintaining a reduced calorie state, getting the recommended amount of protein, incorporating whole foods, making healthy choices, staying well hydrated and practicing mindfulness when eating..  Additional resources provided today: Handout on holiday eating strategies and Journaling Log Handout  Evidence-based interventions for health behavior change were utilized today including the discussion of self monitoring techniques, problem-solving barriers and SMART goal setting techniques.   Regarding patient's less desirable eating habits and patterns, we employed the technique of small changes.   Pt will specifically work on: journaling intake/bringing in food log   Recommended Physical Activity Goals Desiree Andrews has been advised to work up to 150  minutes of moderate intensity aerobic activity a week and strengthening exercises 2-3 times per week for cardiovascular health, weight loss maintenance and preservation of muscle mass.   She has agreed to : Continue current level of physical activity    Pharmacotherapy We both agreed to : continue current anti-obesity medication regimen   FOR ASSOCIATED CONDITIONS ADDRESSED TODAY:  Pre-diabetes Assessment & Plan: Most recent Hemoglobin A1c:  Lab Results  Component Value Date   HGBA1C 6.2 02/05/2023   HGBA1C 6.3 (H) 11/12/2022   HGBA1C 6.3 (H) 06/30/2022   INSULIN 14.7 06/30/2022    Prediabetes treated with Metformin XR 500 mg daily. Reports having some hunger- this could be secondary to being under in protein intake.   Maintain with Metformin at current dose. Reminded patient that having adequate protein with each meal is important for controlling hunger and cravings.Continue to decrease simple carbs/ sugars; increase fiber and proteins -> follow her meal plan.    Orders: -     metFORMIN HCl ER; Take 1 tablet (500 mg total) by mouth daily with lunch.  Dispense: 30 tablet; Refill: 0 -     Hemoglobin A1c   Environmental and seasonal allergies Assessment & Plan: Allergy symptoms have been stable with Xyzal 5 mg daily. She requests refill.   Maintain with Xyzal at current dose. Continue with low-inflammatory meal plan.   Orders:  -     Levocetirizine Dihydrochloride; Take 1 tablet (5 mg total) by mouth every evening.  Dispense: 30 tablet; Refill: 0   Vitamin D deficiency Assessment & Plan: Most recent vit D of 42.96 on 02/05/23. She reports good compliance and tolerance with OTC cholecalciferol 2,000 international units once  daily.   Continue with weight loss efforts and current supplementation regiment.    Orders: -     Vitamin D (Cholecalciferol); 2,000 IU QD -     VITAMIN D 25 Hydroxy (Vit-D Deficiency, Fractures)   Pure hypertriglyceridemia Assessment & Plan: Most  recent Lipid Panel:  Lab Results  Component Value Date   CHOL 190 02/05/2023   HDL 47.10 02/05/2023   LDLCALC 110 (H) 06/30/2022   LDLDIRECT 87.0 02/05/2023   TRIG 335.0 (H) 02/05/2023   CHOLHDL 4 02/05/2023   Condition treated with Lipitor 40 mg daily. Last Lipid panel obtained on 01/2023. Will recheck today as part of routine screening. Desiree Andrews will continue with statin therapy and her heart-healthy meal plan that is low in saturated, trans fats, and fatty carbs. May consider having her start a fish oil next OV. She was advised to discuss with PCP  about obtaining a coronary calcium scan.   Orders: -     Lipid panel -     LDL cholesterol, direct -     Comprehensive metabolic panel   FOLLOW UP:  Come 2-3 days prior to next OV for repeat labs.   Return 08/09/23. She was informed of the importance of frequent follow up visits to maximize her success with intensive lifestyle modifications for her multiple health conditions.  Desiree Andrews is aware that we will review all of her lab results at our next visit.  She is aware that if anything is critical/ life threatening with the results, we will be contacting her via MyChart prior to the office visit to discuss management.    Subjective:   Chief complaint: Obesity Desiree Andrews is here to discuss her progress with her obesity treatment plan. She is on the Category 2 Plan with B/L options and states she is following her eating plan approximately 70% of the time. She states she is not exercising.   Interval History:  Desiree Andrews is here for a follow up office visit. Since last OV, Desiree Andrews's weight has not changed. Reports having a difficult time implementing her meal plan. She reports trying to be more protein focused. Has been having increased hunger.   Pharmacotherapy for weight loss: She is currently taking  Wellbutrin XL and Metformin .   Review of Systems:  Pertinent positives were addressed with patient today.  Reviewed by  clinician on day of visit: allergies, medications, problem list, medical history, surgical history, family history, social history, and previous encounter notes.  Weight Summary and Biometrics   Weight Lost Since Last Visit: 0lb  Weight Gained Since Last Visit: 0lb   Vitals Temp: 98.4 F (36.9 C) BP: 125/83 Pulse Rate: 76 SpO2: 96 %   Anthropometric Measurements Height: 5\' 2"  (1.575 m) Weight: 165 lb (74.8 kg) BMI (Calculated): 30.17 Weight at Last Visit: 165lb Weight Lost Since Last Visit: 0lb Weight Gained Since Last Visit: 0lb Starting Weight: 170lb Peak Weight: 174lb   Body Composition  Body Fat %: 40.2 % Fat Mass (lbs): 66.6 lbs Muscle Mass (lbs): 94 lbs Total Body Water (lbs): 64.2 lbs Visceral Fat Rating : 11   Other Clinical Data Fasting: no Labs: no Today's Visit #: 14 Starting Date: 07/01/23   Objective:   PHYSICAL EXAM: Blood pressure 125/83, pulse 76, temperature 98.4 F (36.9 C), height 5\' 2"  (1.575 m), weight 165 lb (74.8 kg), SpO2 96%. Body mass index is 30.18 kg/m.  General: she is overweight, cooperative and in no acute distress. PSYCH: Has normal mood, affect and  thought process.   HEENT: EOMI, sclerae are anicteric. Lungs: Normal breathing effort, no conversational dyspnea. Extremities: Moves * 4 Neurologic: A and O * 3, good insight  DIAGNOSTIC DATA REVIEWED: BMET    Component Value Date/Time   NA 137 02/05/2023 0828   NA 141 06/30/2022 1229   K 4.0 02/05/2023 0828   CL 101 02/05/2023 0828   CO2 25 02/05/2023 0828   GLUCOSE 124 (H) 02/05/2023 0828   BUN 19 02/05/2023 0828   BUN 14 06/30/2022 1229   CREATININE 0.67 02/05/2023 0828   CALCIUM 9.6 02/05/2023 0828   GFRNONAA >60 08/17/2019 1027   GFRAA >60 08/17/2019 1027   Lab Results  Component Value Date   HGBA1C 6.2 02/05/2023   HGBA1C 6.1 05/01/2009   Lab Results  Component Value Date   INSULIN 14.7 06/30/2022   Lab Results  Component Value Date   TSH 0.942  06/30/2022   CBC    Component Value Date/Time   WBC 6.0 06/30/2022 1229   WBC 6.1 08/12/2021 0757   RBC 4.71 06/30/2022 1229   RBC 4.63 08/12/2021 0757   HGB 14.8 06/30/2022 1229   HGB 15.1 06/12/2010 1439   HCT 43.7 06/30/2022 1229   HCT 43.2 06/12/2010 1439   PLT 251 06/30/2022 1229   MCV 93 06/30/2022 1229   MCV 94.5 06/12/2010 1439   MCH 31.4 06/30/2022 1229   MCH 31.7 08/17/2019 1027   MCHC 33.9 06/30/2022 1229   MCHC 34.1 08/12/2021 0757   RDW 12.1 06/30/2022 1229   RDW 12.6 06/12/2010 1439   Iron Studies No results found for: "IRON", "TIBC", "FERRITIN", "IRONPCTSAT" Lipid Panel     Component Value Date/Time   CHOL 190 02/05/2023 0828   CHOL 216 (H) 06/30/2022 1229   TRIG 335.0 (H) 02/05/2023 0828   HDL 47.10 02/05/2023 0828   HDL 52 06/30/2022 1229   CHOLHDL 4 02/05/2023 0828   VLDL 67.0 (H) 02/05/2023 0828   LDLCALC 110 (H) 06/30/2022 1229   LDLDIRECT 87.0 02/05/2023 0828   Hepatic Function Panel     Component Value Date/Time   PROT 7.4 02/05/2023 0828   PROT 6.9 06/30/2022 1229   ALBUMIN 4.5 02/05/2023 0828   ALBUMIN 4.6 06/30/2022 1229   AST 22 02/05/2023 0828   ALT 29 02/05/2023 0828   ALKPHOS 69 02/05/2023 0828   BILITOT 0.7 02/05/2023 0828   BILITOT 0.4 06/30/2022 1229   BILIDIR 0.0 09/27/2013 0822      Component Value Date/Time   TSH 0.942 06/30/2022 1229   Nutritional Lab Results  Component Value Date   VD25OH 42.96 02/05/2023   VD25OH 48.2 11/12/2022   VD25OH 42.5 06/30/2022    Attestations:   I, Special Puri , acting as a Stage manager for [employer], DO., have compiled all relevant documentation for today's office visit on behalf of Thomasene Lot, DO, while in the presence of [employer], DO.  I have reviewed the above documentation for accuracy and completeness, and I agree with the above. Desiree Andrews, D.O.  The 21st Century Cures Act was signed into law in 2016 which includes the topic of electronic health  records.  This provides immediate access to information in MyChart.  This includes consultation notes, operative notes, office notes, lab results and pathology reports.  If you have any questions about what you read please let us know at your next visit so we can discuss your concerns and take corrective action if need be.  We are right here  with you.

## 2023-08-02 ENCOUNTER — Encounter: Payer: Self-pay | Admitting: Dermatology

## 2023-08-04 ENCOUNTER — Ambulatory Visit: Payer: Medicare Other | Admitting: Dermatology

## 2023-08-04 DIAGNOSIS — D239 Other benign neoplasm of skin, unspecified: Secondary | ICD-10-CM

## 2023-08-04 NOTE — Progress Notes (Signed)
   Follow-Up Visit   Subjective  Desiree Andrews is a 67 y.o. female who presents for the following: Excision of a Dysplastic nevus on pt left upper back.  The following portions of the chart were reviewed this encounter and updated as appropriate: medications, allergies, medical history  Review of Systems:  No other skin or systemic complaints except as noted in HPI or Assessment and Plan.  Objective  Well appearing patient in no apparent distress; mood and affect are within normal limits.  A focused examination was performed of the following areas:  Left upper back  Relevant physical exam findings are noted in the Assessment and Plan.     Assessment & Plan      No follow-ups on file.  Dominga Ferry, Surg Tech III, am acting as scribe for Gwenith Daily, MD.   Documentation: I have reviewed the above documentation for accuracy and completeness, and I agree with the above.  Gwenith Daily, MD

## 2023-08-09 ENCOUNTER — Ambulatory Visit (INDEPENDENT_AMBULATORY_CARE_PROVIDER_SITE_OTHER): Payer: Medicare Other | Admitting: Adult Health

## 2023-08-10 ENCOUNTER — Encounter: Payer: Self-pay | Admitting: Internal Medicine

## 2023-08-10 NOTE — Progress Notes (Unsigned)
Subjective:    Patient ID: Desiree Andrews, female    DOB: [birth year], 67 y.o.   MRN: [id number]      HPI Navjot is here for a Physical exam and her chronic medical problems.    Doing well - no concerns  Medications and allergies reviewed with patient and updated if appropriate.  Current Outpatient Medications on File Prior to Visit  Medication Sig Dispense Refill   amLODipine (NORVASC) 2.5 MG tablet TAKE 1 TABLET(2.5 MG) BY MOUTH DAILY 90 tablet 1   amphetamine-dextroamphetamine (ADDERALL) 20 MG tablet Take 10 mg by mouth 2 (two) times daily.   0   atorvastatin (LIPITOR) 40 MG tablet TAKE 1 TABLET BY MOUTH DAILY. 90 tablet 2   buPROPion (WELLBUTRIN XL) 150 MG 24 hr tablet Take 1 tablet (150 mg total) by mouth daily. 90 tablet 1   hydrochlorothiazide (HYDRODIURIL) 25 MG tablet TAKE 1 TABLET(25 MG) BY MOUTH DAILY 90 tablet 1   ibuprofen (ADVIL) 600 MG tablet Take 600 mg by mouth every 8 (eight) hours as needed.     ketoconazole (NIZORAL) 2 % cream Apply to aa's rash on the abdomen QD PRN. 60 g 0   levocetirizine (XYZAL) 5 MG tablet Take 1 tablet (5 mg total) by mouth every evening. 30 tablet 0   metFORMIN (GLUCOPHAGE-XR) 500 MG 24 hr tablet Take 1 tablet (500 mg total) by mouth daily with lunch. 30 tablet 0   metoprolol tartrate (LOPRESSOR) 50 MG tablet TAKE 1 AND 1/2 TABLETS(75 MG) BY MOUTH TWICE DAILY 270 tablet 1   tacrolimus (PROTOPIC) 0.1 % ointment Apply topically 2 (two) times daily. 100 g 2   triamcinolone cream (KENALOG) 0.1 % Apply 1 Application topically 2 (two) times daily. 30 g 0   Vitamin D, Cholecalciferol, 25 MCG (1000 UT) CAPS 2,000 IU QD     No current facility-administered medications on file prior to visit.    Review of Systems  Constitutional:  Negative for fever.  Eyes:  Negative for visual disturbance.  Respiratory:  Negative for cough, shortness of breath and wheezing.   Cardiovascular:  Negative for chest pain, palpitations and leg swelling.   Gastrointestinal:  Positive for constipation (controlled). Negative for abdominal pain, blood in stool and diarrhea.       No gerd  Genitourinary:  Negative for dysuria.  Musculoskeletal:  Negative for arthralgias and back pain.  Skin:  Negative for rash.  Neurological:  Negative for light-headedness and headaches.  Psychiatric/Behavioral:  Negative for dysphoric mood. The patient is not nervous/anxious.        Objective:   Vitals:   08/11/23 0910  BP: 120/84  Pulse: 68  Temp: 98 F (36.7 C)  SpO2: 98%   Filed Weights   08/11/23 0910  Weight: 167 lb (75.8 kg)   Body mass index is 30.54 kg/m.  BP Readings from Last 3 Encounters:  08/11/23 120/84  07/08/23 125/83  06/09/23 124/78    Wt Readings from Last 3 Encounters:  08/11/23 167 lb (75.8 kg)  07/08/23 165 lb (74.8 kg)  06/09/23 165 lb (74.8 kg)       Physical Exam Constitutional: She appears well-developed and well-nourished. No distress.  HENT:  Head: Normocephalic and atraumatic.  Right Ear: External ear normal. Normal ear canal and TM Left Ear: External ear normal.  Normal ear canal and TM Mouth/Throat: Oropharynx is clear and moist.  Eyes: Conjunctivae normal.  Neck: Neck supple. No tracheal deviation present. No thyromegaly present.  No carotid  bruit  Cardiovascular: Normal rate, regular rhythm and normal heart sounds.   No murmur heard.  No edema. Pulmonary/Chest: Effort normal and breath sounds normal. No respiratory distress. She has no wheezes. She has no rales.  Breast: deferred   Abdominal: Soft. She exhibits no distension. There is no tenderness.  Lymphadenopathy: She has no cervical adenopathy.  Skin: Skin is warm and dry. She is not diaphoretic.  Psychiatric: She has a normal mood and affect. Her behavior is normal.     Lab Results  Component Value Date   WBC 6.0 06/30/2022   HGB 14.8 06/30/2022   HCT 43.7 06/30/2022   PLT 251 06/30/2022   GLUCOSE 124 (H) 02/05/2023   CHOL 190  02/05/2023   TRIG 335.0 (H) 02/05/2023   HDL 47.10 02/05/2023   LDLDIRECT 87.0 02/05/2023   LDLCALC 110 (H) 06/30/2022   ALT 29 02/05/2023   AST 22 02/05/2023   NA 137 02/05/2023   K 4.0 02/05/2023   CL 101 02/05/2023   CREATININE 0.67 02/05/2023   BUN 19 02/05/2023   CO2 25 02/05/2023   TSH 0.942 06/30/2022   HGBA1C 6.2 02/05/2023         Assessment & Plan:   Physical exam: Screening blood work  ordered Exercise  walks a lot at work - > 10,000 steps Weight  obese Substance abuse  none   Reviewed recommended immunizations.   Health Maintenance  Topic Date Due   Medicare Annual Wellness (AWV)  Never done   DTaP/Tdap/Td (2 - Td or Tdap) 05/05/2021   DEXA SCAN  01/03/2023   INFLUENZA VACCINE  03/25/2023   COVID-19 Vaccine (4 - 2024-25 season) 04/25/2023   MAMMOGRAM  09/17/2024   Colonoscopy  01/27/2027   Pneumonia Vaccine 63+ Years old  Completed   Hepatitis C Screening  Completed   Zoster Vaccines- Shingrix  Completed   HPV VACCINES  Aged Out          See Problem List for Assessment and Plan of chronic medical problems.

## 2023-08-10 NOTE — Patient Instructions (Addendum)

## 2023-08-11 ENCOUNTER — Ambulatory Visit (INDEPENDENT_AMBULATORY_CARE_PROVIDER_SITE_OTHER): Payer: Medicare Other | Admitting: Internal Medicine

## 2023-08-11 VITALS — BP 120/84 | HR 68 | Temp 98.0°F | Ht 62.0 in | Wt 167.0 lb

## 2023-08-11 DIAGNOSIS — R7303 Prediabetes: Secondary | ICD-10-CM | POA: Diagnosis not present

## 2023-08-11 DIAGNOSIS — M8589 Other specified disorders of bone density and structure, multiple sites: Secondary | ICD-10-CM | POA: Diagnosis not present

## 2023-08-11 DIAGNOSIS — Z853 Personal history of malignant neoplasm of breast: Secondary | ICD-10-CM

## 2023-08-11 DIAGNOSIS — Z0000 Encounter for general adult medical examination without abnormal findings: Secondary | ICD-10-CM | POA: Diagnosis not present

## 2023-08-11 DIAGNOSIS — E559 Vitamin D deficiency, unspecified: Secondary | ICD-10-CM

## 2023-08-11 DIAGNOSIS — F3289 Other specified depressive episodes: Secondary | ICD-10-CM

## 2023-08-11 DIAGNOSIS — I10 Essential (primary) hypertension: Secondary | ICD-10-CM

## 2023-08-11 DIAGNOSIS — R600 Localized edema: Secondary | ICD-10-CM

## 2023-08-11 DIAGNOSIS — F1010 Alcohol abuse, uncomplicated: Secondary | ICD-10-CM

## 2023-08-11 DIAGNOSIS — E785 Hyperlipidemia, unspecified: Secondary | ICD-10-CM

## 2023-08-11 NOTE — Assessment & Plan Note (Addendum)
Mammogram up to date Has not seen GYN in a while-encouraged her to go back and see them on a regular basis No evidence of recurrence

## 2023-08-11 NOTE — Assessment & Plan Note (Signed)
Chronic Controlled Continue hydrochlorothiazide 25 mg daily 

## 2023-08-11 NOTE — Assessment & Plan Note (Signed)
Chronic Taking metformin 500 mg daily Working with healthy weight and wellness clinic for weight loss and lifestyle changes Low sugar/carbohydrate diet advised  Lab Results  Component Value Date   HGBA1C 6.2 02/05/2023

## 2023-08-11 NOTE — Assessment & Plan Note (Signed)
Chronic Blood pressure well controlled CMP Continue amlodipine 2.5 mg daily, HCTZ 25 mg daily, metoprolol 75 mg twice daily 

## 2023-08-11 NOTE — Assessment & Plan Note (Signed)
Chronic controlled Continue Wellbutrin XL 150 mg daily

## 2023-08-11 NOTE — Assessment & Plan Note (Signed)
Chronic Taking vitamin D daily 

## 2023-08-11 NOTE — Assessment & Plan Note (Signed)
Chronic DEXA scheduled Encouraged regular exercise Advised calcium and vitamin D daily

## 2023-08-11 NOTE — Assessment & Plan Note (Signed)
Chronic Often drinks 2 glasses of wine at night-encouraged her to decrease her level of drinking Discussed this does increase her risk of breast cancer recurrence among other things

## 2023-08-11 NOTE — Assessment & Plan Note (Signed)
Chronic Regular exercise and healthy diet encouraged Check lipid panel, CMP Continue atorvastatin 40 mg daily 

## 2023-08-16 ENCOUNTER — Telehealth: Payer: Self-pay

## 2023-08-16 ENCOUNTER — Telehealth: Payer: Medicare Other | Admitting: Nurse Practitioner

## 2023-08-16 ENCOUNTER — Ambulatory Visit (INDEPENDENT_AMBULATORY_CARE_PROVIDER_SITE_OTHER): Payer: Medicare Other | Admitting: Family Medicine

## 2023-08-16 DIAGNOSIS — J069 Acute upper respiratory infection, unspecified: Secondary | ICD-10-CM

## 2023-08-16 NOTE — Progress Notes (Signed)
Virtual Visit Consent   Desiree Andrews, you are scheduled for a virtual visit with a [facility] provider today. Just as with appointments in the office, your consent must be obtained to participate. Your consent will be active for this visit and any virtual visit you may have with one of our providers in the next 365 days. If you have a MyChart account, a copy of this consent can be sent to you electronically.  As this is a virtual visit, video technology does not allow for your provider to perform a traditional examination. This may limit your provider's ability to fully assess your condition. If your provider identifies any concerns that need to be evaluated in person or the need to arrange testing (such as labs, EKG, etc.), we will make arrangements to do so. Although advances in technology are sophisticated, we cannot ensure that it will always work on either your end or our end. If the connection with a video visit is poor, the visit may have to be switched to a telephone visit. With either a video or telephone visit, we are not always able to ensure that we have a secure connection.  By engaging in this virtual visit, you consent to the provision of healthcare and authorize for your insurance to be billed (if applicable) for the services provided during this visit. Depending on your insurance coverage, you may receive a charge related to this service.  I need to obtain your verbal consent now. Are you willing to proceed with your visit today? Desiree Andrews has provided verbal consent on 08/16/2023 for a virtual visit (video or telephone). Desiree Simas, FNP  Date: 08/16/2023 6:29 PM  Virtual Visit via Video Note   I, Desiree Andrews, connected with  Desiree Andrews  ([id number], March 19, 1956) on 08/16/23 at  6:30 PM EST by a video-enabled telemedicine application and verified that I am speaking with the correct person using two identifiers.  Location: Patient: Virtual Visit  Location Patient: Home Provider: Virtual Visit Location Provider: Home Office   I discussed the limitations of evaluation and management by telemedicine and the availability of in person appointments. The patient expressed understanding and agreed to proceed.    History of Present Illness: Desiree Andrews is a 67 y.o. who identifies as a female who was assigned female at birth, and is being seen today for cough and congestion   Symptom onset was 4 days ago   Onset was with nasal congestion  Mild cough with productive quality but this is mild and she is not keeping her up at night   She has been using Zycam  Alka Seltzer and Mucinex for relief  She doesn't feel she is getting much relief from OTC medications  Denies sinus headaches Denies a fever   She denies a need for inhalers in the past or asthma   Denies any sick contacts   She does suffer from allergies and uses Zyrtec daily    Problems:  Patient Active Problem List   Diagnosis Date Noted   Depression 02/03/2023   BMI 30.0-30.9,adult 11/12/2022   Environmental and seasonal allergies 11/12/2022   Class 1 obesity with serious comorbidity and body mass index (BMI) of 31.0 to 31.9 in adult 11/12/2022   SOB (shortness of breath) 06/30/2022   Allergic contact dermatitis due to plants, except food 02/11/2022   Bilateral leg edema 12/20/2019   Alcohol abuse 03/14/2019   Lichen sclerosus 02/24/2017   Prediabetes 02/17/2017   Generalized obesity 02/17/2017  Disturbance in sleep behavior 08/06/2010   PARESTHESIA, HANDS 05/20/2009   Vitamin D deficiency 12/18/2008   Osteopenia 12/18/2008   BREAST CANCER, HX OF 12/18/2008   Dyslipidemia 10/19/2007   Hepatitis C antibody test positive 10/19/2007   Essential hypertension 06/07/2007    Allergies:  Allergies  Allergen Reactions   Codeine     Other reaction(s): Unknown   Pravastatin     Sleep disturbance   Medications:  Current Outpatient Medications:    amLODipine  (NORVASC) 2.5 MG tablet, TAKE 1 TABLET(2.5 MG) BY MOUTH DAILY, Disp: 90 tablet, Rfl: 1   amphetamine-dextroamphetamine (ADDERALL) 20 MG tablet, Take 10 mg by mouth 2 (two) times daily. , Disp: , Rfl: 0   atorvastatin (LIPITOR) 40 MG tablet, TAKE 1 TABLET BY MOUTH DAILY., Disp: 90 tablet, Rfl: 2   buPROPion (WELLBUTRIN XL) 150 MG 24 hr tablet, Take 1 tablet (150 mg total) by mouth daily., Disp: 90 tablet, Rfl: 1   hydrochlorothiazide (HYDRODIURIL) 25 MG tablet, TAKE 1 TABLET(25 MG) BY MOUTH DAILY, Disp: 90 tablet, Rfl: 1   ibuprofen (ADVIL) 600 MG tablet, Take 600 mg by mouth every 8 (eight) hours as needed., Disp: , Rfl:    ketoconazole (NIZORAL) 2 % cream, Apply to aa's rash on the abdomen QD PRN., Disp: 60 g, Rfl: 0   levocetirizine (XYZAL) 5 MG tablet, Take 1 tablet (5 mg total) by mouth every evening., Disp: 30 tablet, Rfl: 0   metFORMIN (GLUCOPHAGE-XR) 500 MG 24 hr tablet, Take 1 tablet (500 mg total) by mouth daily with lunch., Disp: 30 tablet, Rfl: 0   metoprolol tartrate (LOPRESSOR) 50 MG tablet, TAKE 1 AND 1/2 TABLETS(75 MG) BY MOUTH TWICE DAILY, Disp: 270 tablet, Rfl: 1   tacrolimus (PROTOPIC) 0.1 % ointment, Apply topically 2 (two) times daily., Disp: 100 g, Rfl: 2   triamcinolone cream (KENALOG) 0.1 %, Apply 1 Application topically 2 (two) times daily., Disp: 30 g, Rfl: 0   Vitamin D, Cholecalciferol, 25 MCG (1000 UT) CAPS, 2,000 IU QD, Disp: , Rfl:   Observations/Objective: Patient is well-developed, well-nourished in no acute distress.  Resting comfortably  at home.  Head is normocephalic, atraumatic.  No labored breathing.  Speech is clear and coherent with logical content.  Patient is alert and oriented at baseline.    Assessment and Plan:  1. Viral URI (Primary)  Discussed viral symptoms with patient  Advised against antibiotics at this time   Continue immune support with Zycam  Mucinex for support of congestion  Start Flonase / continue Zyrtec   Follow up if  symptoms persist or worsen as discussed      Follow Up Instructions: I discussed the assessment and treatment plan with the patient. The patient was provided an opportunity to ask questions and all were answered. The patient agreed with the plan and demonstrated an understanding of the instructions.  A copy of instructions were sent to the patient via MyChart unless otherwise noted below.    The patient was advised to call back or seek an in-person evaluation if the symptoms worsen or if the condition fails to improve as anticipated.    Desiree Simas, FNP

## 2023-08-23 NOTE — Telephone Encounter (Signed)
Copied from CRM [id number]. Topic: Clinical - Medical Advice >> Aug 16, 2023 11:02 AM Orinda Kenner C wrote: Reason for CRM: Severe cold since Friday, Sx running nose, congestion, coughing, no phelgm, tired and hoarse voice. Pt denies a fever, Pt has been taking OTC meds with no help. Pt would like an antibiotics to call in bc pt wants to be with her family for Christmas. Pls c/b [phone] or MyChart. [address] [id number] [place], [place] - [address] AT [facility] OF [facility] [address] [place] [address] Phone:[phone]

## 2023-08-23 NOTE — Telephone Encounter (Signed)
Called patient back today and left message.  If she is still having symptoms recommended she contact office for appointment. Also relayed in message that appointment would be needed before any antibiotics could be prescribed.

## 2023-08-24 ENCOUNTER — Ambulatory Visit: Payer: Medicare Other | Admitting: Dermatology

## 2023-08-24 ENCOUNTER — Encounter: Payer: Self-pay | Admitting: Dermatology

## 2023-08-24 VITALS — BP 138/74 | HR 68 | Temp 98.4°F

## 2023-08-24 DIAGNOSIS — D239 Other benign neoplasm of skin, unspecified: Secondary | ICD-10-CM

## 2023-08-24 DIAGNOSIS — L988 Other specified disorders of the skin and subcutaneous tissue: Secondary | ICD-10-CM | POA: Diagnosis not present

## 2023-08-24 DIAGNOSIS — D235 Other benign neoplasm of skin of trunk: Secondary | ICD-10-CM | POA: Diagnosis not present

## 2023-08-24 NOTE — Progress Notes (Signed)
 Follow-Up Visit   Subjective  Desiree Andrews is a 67 y.o. 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 Alm  The following portions of the chart were reviewed this encounter and updated as appropriate: medications, allergies, medical history  Review of Systems:  No other skin or systemic complaints except as noted in HPI or Assessment and Plan.  Objective  Well appearing patient in no apparent distress; mood and affect are within normal limits.  A focused examination was performed of the following areas:  Back  Relevant physical exam findings are noted in the Assessment and Plan.     Assessment & Plan   DYSPLASTIC NEVUS Left Upper Back Skin excision - Left Upper Back  Excision method:  elliptical Lesion length (cm):  0.8 Lesion width (cm):  0.8 Margin per side (cm):  0.5 Total excision diameter (cm):  1.8 Informed consent: discussed and consent obtained   Timeout: patient name, date of birth, surgical site, and procedure verified   Procedure prep:  Patient was prepped and draped in usual sterile fashion Prep type:  Chlorhexidine  Anesthesia: the lesion was anesthetized in a standard fashion   Anesthetic:  1% lidocaine  w/ epinephrine  1-100,000 buffered w/ 8.4% NaHCO3 Instrument used: #15 blade   Hemostasis achieved with: suture, pressure and electrodesiccation   Outcome: patient tolerated procedure well with no complications   Post-procedure details: sterile dressing applied and wound care instructions given   Dressing type: pressure dressing, bandage and petrolatum  gauze    Skin repair - Left Upper Back Complexity:  Complex Final length (cm):  4 Informed consent: discussed and consent obtained   Timeout: patient name, date of birth, surgical site, and procedure verified   Procedure prep:  Patient was prepped and draped in usual sterile fashion Prep type:   Chlorhexidine  Anesthesia: the lesion was anesthetized in a standard fashion   Anesthetic:  1% lidocaine  w/ epinephrine  1-100,000 buffered w/ 8.4% NaHCO3 Reason for type of repair: reduce tension to allow closure, preserve normal anatomy, allow side-to-side closure without requiring a flap or graft and compensate for the inelasticity of skin in this area   Undermining: edges undermined   Subcutaneous layers (deep stitches):  Suture size:  3-0 Suture type: PDS (polydioxanone)   Stitches:  Buried vertical mattress Fine/surface layer approximation (top stitches):  Suture type: cyanoacrylate tissue glue   Suture removal (days):  0 Hemostasis achieved with: suture, pressure and electrodesiccation Outcome: patient tolerated procedure well with no complications   Post-procedure details: sterile dressing applied and wound care instructions given   Dressing type: pressure dressing and bandage    The surgical wound was then cleaned, prepped, and re-anesthetized as above. Wound edges were undermined extensively along at least one entire edge and at a distance equal to or greater than the width of the defect (see wound defect size above) in order to achieve closure and decrease wound tension and anatomic distortion. Redundant tissue repair including standing cone removal was performed. Hemostasis was achieved with electrocautery. Subcutaneous and epidermal tissues were approximated with the above sutures. The surgical site was then lightly scrubbed with sterile, saline-soaked gauze. Steri-strips were applied, and the area was then bandaged using Vaseline ointment, non-adherent gauze, gauze pads, and tape to provide an adequate pressure dressing. The patient tolerated the procedure well, was given detailed written and verbal wound care instructions, and was discharged in good condition.   The patient will follow-up:  PRN.  No follow-ups on file.   Documentation: I have reviewed the above documentation for  accuracy and completeness, and I agree with the above.  RUFUS CHRISTELLA HOLY, MD

## 2023-08-24 NOTE — Patient Instructions (Signed)
Wound Care Instructions for After Surgery  On the day following your surgery, you should begin doing daily dressing changes until your sutures are removed:  Remove the bandage.  Cleanse the wound gently with soap and water.   Make sure you then dry the skin surrounding the wound completely or the tape will not stick to the skin. Do not use cotton balls on the wound.  After the wound is clean and dry, apply the ointment (either prescription antibiotic prescribed by your doctor or plain Vaseline if nothing was prescribed) gently with a Q-tip.  If you are using a bandaid to cover:  Apply a bandaid large enough to cover the entire wound.  If you do not have a bandaid large enough to cover the wound OR if you are sensitive to bandaid adhesive:  Cut a non-stick pad (such as Telfa) to fit the size of the wound.   Cover the wound with the non-stick pad.  If the wound is draining, you may want to add a small amount of gauze on top of the non-stick pad for a little added compression to the area.  Use tape to seal the area completely.  For the next 1-2 weeks:  Be sure to keep the wound moist with ointment 24/7 to ensure best healing. If you are unable to cover the wound with a bandage to hold the ointment in place, you may need to reapply the ointment several times a day.  Do not bend over or lift heavy items to reduce the chance of elevated blood pressure to the wound.  Do not participate in particularly strenuous activities.  Below is a list of dressing supplies you might need.   Cotton-tipped applicators - Q-tips  Gauze pads (2x2 and/or 4x4) - All-Purpose Sponges  New and clean tube of petroleum jelly (Vaseline) OR prescription antibiotic ointment if prescribed  Either a bandaid large enough to cover the entire wound OR non-stick dressing material (Telfa) and Tape (Paper or Hypafix)  FOR ADULT SURGERY PATIENTS: If you need something for pain relief, you may take 1 extra strength  Tylenol (acetaminophen) and 2 ibuprofen (200 mg) together every 4 hours as needed. (Do not take these medications if you are allergic to them or if you know you cannot take them for any other reason). Typically you may only need pain medication for 1-3 days.   Comments on the Post-Operative Period Slight swelling and redness often appear around the wound. This is normal and will disappear within several days following the surgery. The healing wound will drain a brownish-red-yellow discharge during healing. This is a normal phase of wound healing. As the wound begins to heal, the drainage may increase in amount. Again, this drainage is normal. Notify us if the drainage becomes persistently bloody, excessively swollen, or intensely painful or develops a foul odor or red streaks.  The healing wound will also typically be itchy. This is normal. If you have severe or persistent pain, Notify us if the discomfort is severe or persistent. Avoid alcoholic beverages when taking pain medicine.  In Case of Wound Hemorrhage A wound hemorrhage is when the bandage suddenly becomes soaked with bright red blood and flows profusely. If this happens, sit down or lie down with your head elevated. If the wound has a dressing on it, do not remove the dressing. Apply pressure to the existing gauze. If the wound is not covered, use a gauze pad to apply pressure and continue applying the pressure for 20 minutes without   peeking. DO NOT COVER THE WOUND WITH A LARGE TOWEL OR WASH CLOTH. Release your hand from the wound site but do not remove the dressing. If the bleeding has stopped, gently clean around the wound. Leave the dressing in place for 24 hours if possible. This wait time allows the blood vessels to close off so that you do not spark a new round of bleeding by disrupting the newly clotted blood vessels with an immediate dressing change. If the bleeding does not subside, continue to hold pressure for 40 minutes. If bleeding  continues, page your physician, contact an After Hours clinic or go to the Emergency Room. ?  

## 2023-08-26 LAB — SURGICAL PATHOLOGY

## 2023-09-01 ENCOUNTER — Other Ambulatory Visit (INDEPENDENT_AMBULATORY_CARE_PROVIDER_SITE_OTHER): Payer: Medicare Other

## 2023-09-01 DIAGNOSIS — I10 Essential (primary) hypertension: Secondary | ICD-10-CM | POA: Diagnosis not present

## 2023-09-01 DIAGNOSIS — R7303 Prediabetes: Secondary | ICD-10-CM | POA: Diagnosis not present

## 2023-09-01 DIAGNOSIS — E785 Hyperlipidemia, unspecified: Secondary | ICD-10-CM | POA: Diagnosis not present

## 2023-09-01 LAB — CBC WITH DIFFERENTIAL/PLATELET
Basophils Absolute: 0.1 10*3/uL (ref 0.0–0.1)
Basophils Relative: 1 % (ref 0.0–3.0)
Eosinophils Absolute: 0.3 10*3/uL (ref 0.0–0.7)
Eosinophils Relative: 4.5 % (ref 0.0–5.0)
HCT: 42 % (ref 36.0–46.0)
Hemoglobin: 14.2 g/dL (ref 12.0–15.0)
Lymphocytes Relative: 37.8 % (ref 12.0–46.0)
Lymphs Abs: 2.8 10*3/uL (ref 0.7–4.0)
MCHC: 33.8 g/dL (ref 30.0–36.0)
MCV: 95.9 fL (ref 78.0–100.0)
Monocytes Absolute: 0.7 10*3/uL (ref 0.1–1.0)
Monocytes Relative: 9.2 % (ref 3.0–12.0)
Neutro Abs: 3.6 10*3/uL (ref 1.4–7.7)
Neutrophils Relative %: 47.5 % (ref 43.0–77.0)
Platelets: 295 10*3/uL (ref 150.0–400.0)
RBC: 4.38 Mil/uL (ref 3.87–5.11)
RDW: 13.4 % (ref 11.5–15.5)
WBC: 7.5 10*3/uL (ref 4.0–10.5)

## 2023-09-01 LAB — COMPREHENSIVE METABOLIC PANEL
ALT: 31 U/L (ref 0–35)
AST: 21 U/L (ref 0–37)
Albumin: 4.4 g/dL (ref 3.5–5.2)
Alkaline Phosphatase: 64 U/L (ref 39–117)
BUN: 18 mg/dL (ref 6–23)
CO2: 28 meq/L (ref 19–32)
Calcium: 9.6 mg/dL (ref 8.4–10.5)
Chloride: 99 meq/L (ref 96–112)
Creatinine, Ser: 0.66 mg/dL (ref 0.40–1.20)
GFR: 89.53 mL/min (ref >60.00)
Glucose, Bld: 111 mg/dL — ABNORMAL HIGH (ref 70–99)
Potassium: 4 meq/L (ref 3.5–5.1)
Sodium: 135 meq/L (ref 135–145)
Total Bilirubin: 0.6 mg/dL (ref 0.2–1.2)
Total Protein: 7.1 g/dL (ref 6.0–8.3)

## 2023-09-01 LAB — TSH: TSH: 1 u[IU]/mL (ref 0.35–5.50)

## 2023-09-01 LAB — LIPID PANEL
Cholesterol: 212 mg/dL — ABNORMAL HIGH (ref 0–200)
HDL: 46.2 mg/dL (ref >39.00)
LDL Cholesterol: 99 mg/dL (ref 0–99)
NonHDL: 165.85
Total CHOL/HDL Ratio: 5
Triglycerides: 332 mg/dL — ABNORMAL HIGH (ref 0.0–149.0)
VLDL: 66.4 mg/dL — ABNORMAL HIGH (ref 0.0–40.0)

## 2023-09-01 LAB — HEMOGLOBIN A1C: Hgb A1c MFr Bld: 6.5 % (ref 4.6–6.5)

## 2023-09-02 ENCOUNTER — Encounter: Payer: Self-pay | Admitting: Internal Medicine

## 2023-09-03 ENCOUNTER — Telehealth: Payer: Self-pay

## 2023-09-03 NOTE — Telephone Encounter (Signed)
 Patient left a message that she has developed a rash from the bandage at her excision site. She has been using OTC hydrocortisone cream but it is not helping. She has clobetasol  cream. I advised her it is ok to use the clobetasol  on the rash twice daily until rash is clear or up to 2 weeks. She will call back is she continues to have problems/hd

## 2023-09-06 ENCOUNTER — Other Ambulatory Visit: Payer: Self-pay | Admitting: Internal Medicine

## 2023-09-14 ENCOUNTER — Encounter (INDEPENDENT_AMBULATORY_CARE_PROVIDER_SITE_OTHER): Payer: Self-pay | Admitting: Family Medicine

## 2023-09-14 ENCOUNTER — Other Ambulatory Visit (INDEPENDENT_AMBULATORY_CARE_PROVIDER_SITE_OTHER): Payer: Self-pay | Admitting: Family Medicine

## 2023-09-14 ENCOUNTER — Ambulatory Visit (INDEPENDENT_AMBULATORY_CARE_PROVIDER_SITE_OTHER): Payer: Medicare Other | Admitting: Family Medicine

## 2023-09-14 VITALS — BP 132/77 | HR 64 | Temp 97.7°F | Ht 62.0 in | Wt 166.0 lb

## 2023-09-14 DIAGNOSIS — E782 Mixed hyperlipidemia: Secondary | ICD-10-CM

## 2023-09-14 DIAGNOSIS — E669 Obesity, unspecified: Secondary | ICD-10-CM | POA: Diagnosis not present

## 2023-09-14 DIAGNOSIS — E119 Type 2 diabetes mellitus without complications: Secondary | ICD-10-CM

## 2023-09-14 DIAGNOSIS — E1169 Type 2 diabetes mellitus with other specified complication: Secondary | ICD-10-CM

## 2023-09-14 DIAGNOSIS — E781 Pure hyperglyceridemia: Secondary | ICD-10-CM | POA: Diagnosis not present

## 2023-09-14 DIAGNOSIS — I10 Essential (primary) hypertension: Secondary | ICD-10-CM

## 2023-09-14 DIAGNOSIS — Z7984 Long term (current) use of oral hypoglycemic drugs: Secondary | ICD-10-CM

## 2023-09-14 DIAGNOSIS — Z6830 Body mass index (BMI) 30.0-30.9, adult: Secondary | ICD-10-CM

## 2023-09-14 MED ORDER — METFORMIN HCL ER 500 MG PO TB24: 1000.0000 mg | ORAL_TABLET | Freq: Every day | ORAL | 0 refills | Status: DC

## 2023-09-14 NOTE — Progress Notes (Signed)
[facility]  Office: [phone]  /  Fax: [phone]  WEIGHT SUMMARY AND BIOMETRICS  Anthropometric Measurements Height: 5\' 2"  (1.575 m) Weight: 166 lb (75.3 kg) BMI (Calculated): 30.35 Weight at Last Visit: 165 lb Weight Lost Since Last Visit: 0 Weight Gained Since Last Visit: 1 lb Starting Weight: 170 lb Total Weight Loss (lbs): 4 lb (1.814 kg)   Body Composition  Body Fat %: 41.8 % Fat Mass (lbs): 69.4 lbs Muscle Mass (lbs): 91.6 lbs Total Body Water (lbs): 64.8 lbs Visceral Fat Rating : 12   Other Clinical Data Fasting: No Labs: No Today's Visit #: 15 Starting Date: 06/16/22    Chief Complaint: OBESITY   History of Present Illness   The patient, typically under the care of a different provider, presents for a discussion regarding her obesity. Over the past ten weeks, including the holiday season, she has gained one pound. She reports adherence to the category two eating plan about fifty percent of the time and is attempting to increase her physical activity, specifically walking, with a goal of ten thousand steps per day. She believes she is achieving this goal about five times per week.  The patient has a history of hypertension, which is currently controlled with amlodipine, hydrochlorothiazide, and metoprolol. She has been focusing on diet, exercise, and weight loss as strategies to manage her hypertension.  Recently, the patient had a check-up with another provider, during which labs were drawn. The results indicated elevated triglycerides, while LDL cholesterol, kidney function, liver tests, blood counts, and thyroid function were all within normal limits. However, her A1c was 6.5, which was interpreted as indicative of diabetes. This diagnosis was distressing for the patient, as diabetes runs in her family.  The patient is currently on a low dose of metformin for blood sugar control and has been considering an increase in dosage or a switch to a GLP-1 drug, such as  Wegovy, for additional weight loss benefits. She has concerns about potential gastrointestinal side effects of these medications.  The patient acknowledges the need for dietary changes to manage her diabetes and obesity, expressing a particular concern about her frequent consumption of cereal and other simple carbohydrates. She is motivated to improve her health and is seeking strategies to reverse her A1c and lose weight.          PHYSICAL EXAM:  Blood pressure 132/77, pulse 64, temperature 97.7 F (36.5 C), height 5\' 2"  (1.575 m), weight 166 lb (75.3 kg), SpO2 94%. Body mass index is 30.36 kg/m.  DIAGNOSTIC DATA REVIEWED:  BMET    Component Value Date/Time   NA 135 09/01/2023 0744   NA 141 06/30/2022 1229   K 4.0 09/01/2023 0744   CL 99 09/01/2023 0744   CO2 28 09/01/2023 0744   GLUCOSE 111 (H) 09/01/2023 0744   BUN 18 09/01/2023 0744   BUN 14 06/30/2022 1229   CREATININE 0.66 09/01/2023 0744   CALCIUM 9.6 09/01/2023 0744   GFRNONAA >60 08/17/2019 1027   GFRAA >60 08/17/2019 1027   Lab Results  Component Value Date   HGBA1C 6.5 09/01/2023   HGBA1C 6.1 05/01/2009   Lab Results  Component Value Date   INSULIN 14.7 06/30/2022   Lab Results  Component Value Date   TSH 1.00 09/01/2023   CBC    Component Value Date/Time   WBC 7.5 09/01/2023 0744   RBC 4.38 09/01/2023 0744   HGB 14.2 09/01/2023 0744   HGB 14.8 06/30/2022 1229   HGB 15.1 06/12/2010 1439  HCT 42.0 09/01/2023 0744   HCT 43.7 06/30/2022 1229   HCT 43.2 06/12/2010 1439   PLT 295.0 09/01/2023 0744   PLT 251 06/30/2022 1229   MCV 95.9 09/01/2023 0744   MCV 93 06/30/2022 1229   MCV 94.5 06/12/2010 1439   MCH 31.4 06/30/2022 1229   MCH 31.7 08/17/2019 1027   MCHC 33.8 09/01/2023 0744   RDW 13.4 09/01/2023 0744   RDW 12.1 06/30/2022 1229   RDW 12.6 06/12/2010 1439   Iron Studies No results found for: "IRON", "TIBC", "FERRITIN", "IRONPCTSAT" Lipid Panel     Component Value Date/Time   CHOL  212 (H) 09/01/2023 0744   CHOL 216 (H) 06/30/2022 1229   TRIG 332.0 (H) 09/01/2023 0744   HDL 46.20 09/01/2023 0744   HDL 52 06/30/2022 1229   CHOLHDL 5 09/01/2023 0744   VLDL 66.4 (H) 09/01/2023 0744   LDLCALC 99 09/01/2023 0744   LDLCALC 110 (H) 06/30/2022 1229   LDLDIRECT 87.0 02/05/2023 0828   Hepatic Function Panel     Component Value Date/Time   PROT 7.1 09/01/2023 0744   PROT 6.9 06/30/2022 1229   ALBUMIN 4.4 09/01/2023 0744   ALBUMIN 4.6 06/30/2022 1229   AST 21 09/01/2023 0744   ALT 31 09/01/2023 0744   ALKPHOS 64 09/01/2023 0744   BILITOT 0.6 09/01/2023 0744   BILITOT 0.4 06/30/2022 1229   BILIDIR 0.0 09/27/2013 0822      Component Value Date/Time   TSH 1.00 09/01/2023 0744   Nutritional Lab Results  Component Value Date   VD25OH 42.96 02/05/2023   VD25OH 48.2 11/12/2022   VD25OH 42.5 06/30/2022     Assessment and Plan    Type 2 Diabetes Mellitus New diagnosis based on an A1c of 6.5%. Discussed genetic predisposition, importance of early control to prevent complications, and potential remission with proper management. Explained carbohydrate management and glycemic index. Discussed GLP-1 agonists Reginal Lutes, Mounjaro) for weight loss and blood sugar control, including side effects (nausea) and insurance coverage issues. Discussed balanced diet, regular physical activity, and avoiding simple carbohydrates to manage overall health. Emphasized avoiding high glycemic index foods. - Encourage a diet rich in proteins and vegetables, avoiding simple carbohydrates - Encourage regular physical activity - Avoid high glycemic index foods such as grapes, cherries, and bananas - Increase metformin-xr to 500 mg twice daily - Consider GLP-1 agonist therapy pending insurance approval - Follow up in four weeks to assess progress and discuss next steps - Repeat A1c in three months  Obesity Gained one pound over the holiday season. Following category two eating plan about 50%  of the time. Attempting to increase walking to 10,000 steps, achieving this goal about five times per week. Discussed the importance of weight loss in managing overall health, including hypertension and diabetes. - Provide a fresh copy of the category two eating plan - Encourage continuation of walking with a goal of 10,000 steps daily  Hypertension Blood pressure controlled at 132/77 mmHg with amlodipine, hydrochlorothiazide, and metoprolol. Discussed potential for weight loss to further improve blood pressure control. - Continue current antihypertensive medications - Monitor blood pressure regularly -diet/exercise/weight loss  Hypertriglyceridemia LDL cholesterol within normal limits, but triglycerides remain elevated. Discussed dietary impact on triglyceride levels. - Continue dietary modifications to reduce triglycerides - Monitor lipid levels regularly  Follow-up - Follow up in four weeks - Repeat A1c in three months.       She was informed of the importance of frequent follow up visits to maximize her success with  intensive lifestyle modifications for her multiple health conditions.    Quillian Quince, MD

## 2023-10-04 DIAGNOSIS — K080 Exfoliation of teeth due to systemic causes: Secondary | ICD-10-CM | POA: Diagnosis not present

## 2023-10-12 ENCOUNTER — Ambulatory Visit (INDEPENDENT_AMBULATORY_CARE_PROVIDER_SITE_OTHER): Payer: Medicare Other | Admitting: Family Medicine

## 2023-10-12 ENCOUNTER — Encounter (INDEPENDENT_AMBULATORY_CARE_PROVIDER_SITE_OTHER): Payer: Self-pay | Admitting: Family Medicine

## 2023-10-13 ENCOUNTER — Other Ambulatory Visit (INDEPENDENT_AMBULATORY_CARE_PROVIDER_SITE_OTHER): Payer: Self-pay | Admitting: Family Medicine

## 2023-10-13 ENCOUNTER — Telehealth (INDEPENDENT_AMBULATORY_CARE_PROVIDER_SITE_OTHER): Payer: Medicare Other | Admitting: Family Medicine

## 2023-10-13 ENCOUNTER — Encounter (INDEPENDENT_AMBULATORY_CARE_PROVIDER_SITE_OTHER): Payer: Self-pay | Admitting: Family Medicine

## 2023-10-13 VITALS — BP 121/78 | HR 64 | Temp 97.8°F | Ht 62.0 in | Wt 167.0 lb

## 2023-10-13 DIAGNOSIS — E1159 Type 2 diabetes mellitus with other circulatory complications: Secondary | ICD-10-CM

## 2023-10-13 DIAGNOSIS — E669 Obesity, unspecified: Secondary | ICD-10-CM

## 2023-10-13 DIAGNOSIS — E782 Mixed hyperlipidemia: Secondary | ICD-10-CM

## 2023-10-13 DIAGNOSIS — E559 Vitamin D deficiency, unspecified: Secondary | ICD-10-CM

## 2023-10-13 DIAGNOSIS — Z6830 Body mass index (BMI) 30.0-30.9, adult: Secondary | ICD-10-CM

## 2023-10-13 DIAGNOSIS — I152 Hypertension secondary to endocrine disorders: Secondary | ICD-10-CM | POA: Diagnosis not present

## 2023-10-13 DIAGNOSIS — Z7984 Long term (current) use of oral hypoglycemic drugs: Secondary | ICD-10-CM

## 2023-10-13 DIAGNOSIS — E1169 Type 2 diabetes mellitus with other specified complication: Secondary | ICD-10-CM

## 2023-10-13 MED ORDER — METFORMIN HCL ER 500 MG PO TB24: 1000.0000 mg | ORAL_TABLET | Freq: Every day | ORAL | 0 refills | Status: AC

## 2023-10-13 NOTE — Progress Notes (Signed)
 Desiree Andrews, D.O.  ABFM, ABOM Specializing in Clinical Bariatric Medicine  Office located at: [address] [place], [place]  [address]   Assessment and Plan:   - A live video visit was scheduled and initiated for today's office visit due to inclement weather.    - I connected with current patient today and verified that I am speaking with the correct person using 2 identifiers  Location of the patient: Home  Location of the provider (and scribe) : Office - This telemedicine (live audio and visual utilized) visit type was conducted due to national recommendations for restrictions regarding symptomatic patients in the midst of the current COVID-19 Pandemic (e.g. social distancing).  It was done in an effort to limit this patient's exposure and/or mitigate transmission in our community.    - No physical exam could be performed with this format, beyond that communicated to [place] by the patient/ family members as noted.   - Additionally members of the medical staff at [facility] discussed the limitations of evaluation and management by telemedicine with the patient and that there may be a monetary charge related to this service, depending on their medical insurance.   The patient expressed understanding, and agreed to proceed.    FOR THE DISEASE OF OBESITY: BMI 30.0-30.9,adult - Current BMI 30.54 Obesity, Beginning BMI 31.09 Assessment & Plan: Since last office visit on 09/14/23 patient's muscle mass has increased by 0.4 lb. Fat mass has increased by 1.2lb. Total body water has increased by 0.4lb.  Counseling done on how various foods will affect these numbers and how to maximize success  Total lbs lost to date: 3 lbs Total weight loss percentage to date: -1.76%   (Bariatric information was obtained prior to pt video visit time)  Recommended Dietary Goals Desiree Andrews is currently in the action stage of change. As such, her goal is to continue weight management plan. Pt  has not been following a meal plan.   She has agreed to:  get back on track with following Category 2 with B/L options Behavioral Intervention We discussed the following today: increasing lean protein intake to established goals, decreasing simple carbohydrates , keeping healthy foods at home, avoiding temptations and identifying enticing environmental cues, and continue to work on implementation of reduced calorie nutritional plan  Additional resources provided today: None  Evidence-based interventions for health behavior change were utilized today including the discussion of self monitoring techniques, problem-solving barriers and SMART goal setting techniques.   Regarding patient's less desirable eating habits and patterns, we employed the technique of small changes.   Pt will specifically work on: work on getting back on track with her meal plan -- CAT 2 with B/L options for next visit.    Recommended Physical Activity Goals Desiree Andrews has been advised to work up to 150 minutes of moderate intensity aerobic activity a week and strengthening exercises 2-3 times per week for cardiovascular health, weight loss maintenance and preservation of muscle mass.   She has agreed to :  Think about enjoyable ways to increase daily physical activity and overcoming barriers to exercise and Increase physical activity in their day and reduce sedentary time (increase NEAT).   Pharmacotherapy We both agreed to: continue with nutritional and behavioral strategies and adequate clinical response to current dose, continue current regimen   FOR ASSOCIATED CONDITIONS ADDRESSED TODAY: Hypertension associated with diabetes [facility]) Assessment & Plan: BP Readings from Last 3 Encounters:  10/13/23 121/78  09/14/23 132/77  08/24/23  138/74  BP was at goal today at 121/78. Per pt, her blood pressure have been better controlled recently. Pt is on Amlodipine 2.5 once daily, HCTZ 25 mg once daily, and Lopressor 75 mg BID.  Tolerating all antihypertensives well with no SE. No acute concerns.   Reviewed BP goal of 120/80 or less. Work on eating a heart healthy diet via her prudent nutritional meal plan. Avoid high sodium foods. Continue with current antihypertensive treatment as prescribed. Will continue to monitor alongside PCP.    Type 2 diabetes mellitus with other specified complication, without long-term current use of insulin [facility]) Assessment & Plan: Lab Results  Component Value Date   HGBA1C 6.5 09/01/2023   HGBA1C 6.2 02/05/2023   HGBA1C 6.3 (H) 11/12/2022   INSULIN 14.7 06/30/2022  Pt was recently diagnosed with T2DM, being followed by her PCP, Dr. Lawerance Bach. Most recent A1C was 6.5 on 09/01/2023. Pt is on Metformin 1,000 once daily. Tolerating well, no SE reported.   Continue with current medication regimen as prescribed. Refill Metformin, no changes made today. Encouraged pt to increase her physical activity daily and follow a low carb high protein diet via her nutritional meal plan. Recommended she go to the American Diabetes Association website for more information on her new condition.   Orders: - Refill Metformin, no dose changes.    Mixed diabetic hyperlipidemia associated with type 2 diabetes mellitus (HCC) Assessment & Plan: Lab Results  Component Value Date   CHOL 212 (H) 09/01/2023   HDL 46.20 09/01/2023   LDLCALC 99 09/01/2023   LDLDIRECT 87.0 02/05/2023   TRIG 332.0 (H) 09/01/2023   CHOLHDL 5 09/01/2023  Triglycerides were significantly elevated at 332.0 and LDL was above goal at 99. Pt is currently on Lipitor 40 mg once daily. Good compliance and tolerance reported and denies any side effects.   LDL with new onset of diabetes should be less than 70. I recommend she discuss with her PCP about her cholesterol and possibly increasing Lipitor. Avoid saturated/trans fats and fatty meats, and decrease simple carbs/sugars --> follow her meal plan. Pt encouraged to visit the American Heart  Association website for more information.    Vitamin D deficiency Assessment & Plan: Lab Results  Component Value Date   VD25OH 42.96 02/05/2023   VD25OH 48.2 11/12/2022   VD25OH 42.5 06/30/2022  Most recent vitamin D below goal at 42.96 in 01/2023. Vitamin D labs were ordered on 07/08/2023 but never obtained. Pt is on Vitamin D 2,000 IU once daily. Tolerating well, no SE reported. No acute concerns today.   Ideal vitamin D goal of 50-70 reviewed with pt. Continue with current supplementation regimen as prescribed. Will continue to monitor condition and recheck vitamin D at her next OV.   Orders: - Future labs ordered: Vitamin D at next OV.    Follow up:   Return in about 4 weeks (around 11/10/2023). She was informed of the importance of frequent follow up visits to maximize her success with intensive lifestyle modifications for her multiple health conditions.  Subjective:   Chief complaint: Obesity Niti is here to discuss her progress with her obesity treatment plan. She is on the Category 2 Plan with B/L options and states she is following her eating plan approximately 0% of the time. She states she is not exercising.  Interval History:  Desiree Andrews is here for a follow up office visit. Since last OV on 09/14/2023, she is up 1 lbs. She reports her biggest challenge has been  not eating bread. Has been trying to prioritize proteins and reports eating more chicken, eggs, and occasionally steak. Has also been eating more berries. Lunch is her hardest meal to stay on plan.  Pharmacotherapy for weight loss: She is currently taking Metformin (off label use for incretin effect and / or insulin resistance and / or diabetes prevention) with adequate clinical response  and without side effects. and Bupropion (single agent, off label use) with adequate clinical response  and without side effects..   Review of Systems:  Pertinent positives were addressed with patient today.  Reviewed  by clinician on day of visit: allergies, medications, problem list, medical history, surgical history, family history, social history, and previous encounter notes.  Weight Summary and Biometrics   Weight Lost Since Last Visit: 0  Weight Gained Since Last Visit: 1    Vitals Temp: 97.8 F (36.6 C) BP: 121/78 Pulse Rate: 64 SpO2: 97 %   Anthropometric Measurements Height: 5\' 2"  (1.575 m) Weight: 167 lb (75.8 kg) BMI (Calculated): 30.54 Weight at Last Visit: 166 lb Weight Lost Since Last Visit: 0 Weight Gained Since Last Visit: 1 Starting Weight: 170 lb Total Weight Loss (lbs): 3 lb (1.361 kg)   Body Composition  Body Fat %: 42.1 % Fat Mass (lbs): 70.6 lbs Muscle Mass (lbs): 92 lbs Total Body Water (lbs): 65.2 lbs Visceral Fat Rating : 12   Other Clinical Data Fasting: No Labs: No Today's Visit #: 16 Starting Date: 06/16/22 Comments: Video Visit (Patient left before being seen yesterday 10/12/2023)    Objective:   PHYSICAL EXAM: Blood pressure 121/78, pulse 64, temperature 97.8 F (36.6 C), height 5\' 2"  (1.575 m), weight 167 lb (75.8 kg), SpO2 97%. Body mass index is 30.54 kg/m.  General: she is overweight, cooperative and in no acute distress. PSYCH: Has normal mood, affect and thought process.   HEENT: EOMI, sclerae are anicteric. Lungs: Normal breathing effort, no conversational dyspnea. Extremities: Moves * 4 Neurologic: A and O * 3, good insight  DIAGNOSTIC DATA REVIEWED: BMET    Component Value Date/Time   NA 135 09/01/2023 0744   NA 141 06/30/2022 1229   K 4.0 09/01/2023 0744   CL 99 09/01/2023 0744   CO2 28 09/01/2023 0744   GLUCOSE 111 (H) 09/01/2023 0744   BUN 18 09/01/2023 0744   BUN 14 06/30/2022 1229   CREATININE 0.66 09/01/2023 0744   CALCIUM 9.6 09/01/2023 0744   GFRNONAA >60 08/17/2019 1027   GFRAA >60 08/17/2019 1027   Lab Results  Component Value Date   HGBA1C 6.5 09/01/2023   HGBA1C 6.1 05/01/2009   Lab Results   Component Value Date   INSULIN 14.7 06/30/2022   Lab Results  Component Value Date   TSH 1.00 09/01/2023   CBC    Component Value Date/Time   WBC 7.5 09/01/2023 0744   RBC 4.38 09/01/2023 0744   HGB 14.2 09/01/2023 0744   HGB 14.8 06/30/2022 1229   HGB 15.1 06/12/2010 1439   HCT 42.0 09/01/2023 0744   HCT 43.7 06/30/2022 1229   HCT 43.2 06/12/2010 1439   PLT 295.0 09/01/2023 0744   PLT 251 06/30/2022 1229   MCV 95.9 09/01/2023 0744   MCV 93 06/30/2022 1229   MCV 94.5 06/12/2010 1439   MCH 31.4 06/30/2022 1229   MCH 31.7 08/17/2019 1027   MCHC 33.8 09/01/2023 0744   RDW 13.4 09/01/2023 0744   RDW 12.1 06/30/2022 1229   RDW 12.6 06/12/2010 1439   Iron Studies  No results found for: "IRON", "TIBC", "FERRITIN", "IRONPCTSAT" Lipid Panel     Component Value Date/Time   CHOL 212 (H) 09/01/2023 0744   CHOL 216 (H) 06/30/2022 1229   TRIG 332.0 (H) 09/01/2023 0744   HDL 46.20 09/01/2023 0744   HDL 52 06/30/2022 1229   CHOLHDL 5 09/01/2023 0744   VLDL 66.4 (H) 09/01/2023 0744   LDLCALC 99 09/01/2023 0744   LDLCALC 110 (H) 06/30/2022 1229   LDLDIRECT 87.0 02/05/2023 0828   Hepatic Function Panel     Component Value Date/Time   PROT 7.1 09/01/2023 0744   PROT 6.9 06/30/2022 1229   ALBUMIN 4.4 09/01/2023 0744   ALBUMIN 4.6 06/30/2022 1229   AST 21 09/01/2023 0744   ALT 31 09/01/2023 0744   ALKPHOS 64 09/01/2023 0744   BILITOT 0.6 09/01/2023 0744   BILITOT 0.4 06/30/2022 1229   BILIDIR 0.0 09/27/2013 0822      Component Value Date/Time   TSH 1.00 09/01/2023 0744   Nutritional Lab Results  Component Value Date   VD25OH 42.96 02/05/2023   VD25OH 48.2 11/12/2022   VD25OH 42.5 06/30/2022    Attestations:   I, Isabelle Course, acting as a medical scribe for Thomasene Lot, DO., have compiled all relevant documentation for today's office visit on behalf of Thomasene Lot, DO, while in the presence of [employer], DO.  Reviewed by clinician on day of visit:  allergies, medications, problem list, medical history, surgical history, family history, social history, and previous encounter notes pertinent to patient's obesity diagnosis.  I have reviewed the above documentation for accuracy and completeness, and I agree with the above. Desiree Andrews, D.O.  The 21st Century Cures Act was signed into law in 2016 which includes the topic of electronic health records.  This provides immediate access to information in MyChart.  This includes consultation notes, operative notes, office notes, lab results and pathology reports.  If you have any questions about what you read please let us know at your next visit so we can discuss your concerns and take corrective action if need be.  We are right here with you.

## 2023-10-18 ENCOUNTER — Other Ambulatory Visit: Payer: Self-pay | Admitting: Internal Medicine

## 2023-10-18 ENCOUNTER — Ambulatory Visit
Admission: RE | Admit: 2023-10-18 | Discharge: 2023-10-18 | Disposition: A | Discharge status: Home / Self Care | Payer: Medicare Other | Source: Ambulatory Visit | Attending: Internal Medicine | Admitting: Internal Medicine

## 2023-10-18 ENCOUNTER — Ambulatory Visit
Admission: RE | Admit: 2023-10-18 | Discharge: 2023-10-18 | Disposition: A | Discharge status: Home / Self Care | Payer: Medicare Other | Source: Ambulatory Visit | Attending: Internal Medicine

## 2023-10-18 ENCOUNTER — Encounter: Payer: Self-pay | Admitting: Internal Medicine

## 2023-10-18 DIAGNOSIS — Z1231 Encounter for screening mammogram for malignant neoplasm of breast: Secondary | ICD-10-CM

## 2023-10-18 DIAGNOSIS — E2839 Other primary ovarian failure: Secondary | ICD-10-CM | POA: Diagnosis not present

## 2023-10-18 DIAGNOSIS — M8589 Other specified disorders of bone density and structure, multiple sites: Secondary | ICD-10-CM

## 2023-10-18 DIAGNOSIS — N958 Other specified menopausal and perimenopausal disorders: Secondary | ICD-10-CM | POA: Diagnosis not present

## 2023-10-18 DIAGNOSIS — M8588 Other specified disorders of bone density and structure, other site: Secondary | ICD-10-CM | POA: Diagnosis not present

## 2023-11-02 ENCOUNTER — Encounter: Payer: Self-pay | Admitting: Internal Medicine

## 2023-11-05 ENCOUNTER — Encounter: Admitting: Internal Medicine

## 2023-11-05 NOTE — Patient Instructions (Signed)
   Blood work was ordered.       Medications changes include :   decrease lasix to 20 mg daily and decrease potassium to 20 mg daily   A referral was ordered for the heart failure clinic.      Return in about 2 months (around 09/12/2023) for follow up.

## 2023-11-05 NOTE — Progress Notes (Signed)
 Void pt had to leave This encounter was created in error - please disregard.

## 2023-11-08 NOTE — Progress Notes (Signed)
 Not seen today.

## 2023-11-12 ENCOUNTER — Ambulatory Visit (INDEPENDENT_AMBULATORY_CARE_PROVIDER_SITE_OTHER): Admitting: Internal Medicine

## 2023-11-12 ENCOUNTER — Encounter: Payer: Self-pay | Admitting: Internal Medicine

## 2023-11-12 VITALS — BP 128/70 | HR 69 | Temp 98.4°F | Ht 62.0 in | Wt 173.0 lb

## 2023-11-12 DIAGNOSIS — E1169 Type 2 diabetes mellitus with other specified complication: Secondary | ICD-10-CM

## 2023-11-12 DIAGNOSIS — L84 Corns and callosities: Secondary | ICD-10-CM

## 2023-11-12 DIAGNOSIS — Z7984 Long term (current) use of oral hypoglycemic drugs: Secondary | ICD-10-CM

## 2023-11-12 MED ORDER — TIRZEPATIDE 2.5 MG/0.5ML ~~LOC~~ SOAJ
2.5000 mg | SUBCUTANEOUS | 0 refills | Status: DC
Start: 2023-11-12 — End: 2023-12-14

## 2023-11-12 NOTE — Progress Notes (Signed)
 Subjective:    Patient ID: Desiree Andrews, female    DOB: 1956/07/25, 68 y.o.   MRN: [id number]      HPI Desiree Andrews is here for  Chief Complaint  Patient presents with   Weight Loss    Wants do to discuss weight loss medication; She spoke with her insurance and said Zebpound but Greggory Keen is covered.    [employer] care management called her and they were talking about zepbound and mounjaro.     Does not always take the metformin - often forgets to take the metformin at night.    Avoid sugars.  Has difficulty avoiding carbs.  Walks at work - gets at least 10,000 steps a day.    She really wants to try a medication that is going to help her.    Medications and allergies reviewed with patient and updated if appropriate.  Current Outpatient Medications on File Prior to Visit  Medication Sig Dispense Refill   amLODipine (NORVASC) 2.5 MG tablet TAKE 1 TABLET(2.5 MG) BY MOUTH DAILY 90 tablet 1   amphetamine-dextroamphetamine (ADDERALL) 20 MG tablet Take 10 mg by mouth 2 (two) times daily.  0   atorvastatin (LIPITOR) 40 MG tablet TAKE 1 TABLET BY MOUTH DAILY. 90 tablet 2   buPROPion (WELLBUTRIN XL) 150 MG 24 hr tablet TAKE 1 TABLET(150 MG) BY MOUTH DAILY 90 tablet 1   hydrochlorothiazide (HYDRODIURIL) 25 MG tablet TAKE 1 TABLET(25 MG) BY MOUTH DAILY 90 tablet 1   ibuprofen (ADVIL) 600 MG tablet Take 600 mg by mouth every 8 (eight) hours as needed.     ketoconazole (NIZORAL) 2 % cream Apply to aa's rash on the abdomen QD PRN. 60 g 0   levocetirizine (XYZAL) 5 MG tablet Take 1 tablet (5 mg total) by mouth every evening. 30 tablet 0   metFORMIN (GLUCOPHAGE-XR) 500 MG 24 hr tablet Take 2 tablets (1,000 mg total) by mouth daily with breakfast. 60 tablet 0   metoprolol tartrate (LOPRESSOR) 50 MG tablet TAKE 1 AND 1/2 TABLETS(75 MG) BY MOUTH TWICE DAILY 270 tablet 1   tacrolimus (PROTOPIC) 0.1 % ointment Apply topically 2 (two) times daily. 100 g 2   triamcinolone cream (KENALOG) 0.1 % Apply 1  Application topically 2 (two) times daily. 30 g 0   Vitamin D, Cholecalciferol, 25 MCG (1000 UT) CAPS 2,000 IU QD     No current facility-administered medications on file prior to visit.    Review of Systems     Objective:   Vitals:   11/12/23 1459  BP: 128/70  Pulse: 69  Temp: 98.4 F (36.9 C)  SpO2: 98%   BP Readings from Last 3 Encounters:  11/12/23 128/70  11/05/23 128/82  10/13/23 121/78   Wt Readings from Last 3 Encounters:  11/12/23 173 lb (78.5 kg)  11/05/23 170 lb (77.1 kg)  10/13/23 167 lb (75.8 kg)   Body mass index is 31.64 kg/m.    Physical Exam Constitutional:      General: She is not in acute distress.    Appearance: Normal appearance. She is not ill-appearing.  HENT:     Head: Normocephalic and atraumatic.  Skin:    General: Skin is warm and dry.  Neurological:     Mental Status: She is alert. Mental status is at baseline.  Psychiatric:        Mood and Affect: Mood normal.        Behavior: Behavior normal.        Thought Content:  Thought content normal.        Judgment: Judgment normal.            Assessment & Plan:    See Problem List for Assessment and Plan of chronic medical problems.

## 2023-11-12 NOTE — Patient Instructions (Addendum)
       Medications changes include :   mounajro 2.5 mg weekly    A referral was ordered podiatry and someone will call you to schedule an appointment.     Return for follow up as scheduled.

## 2023-11-13 NOTE — Assessment & Plan Note (Signed)
 Chronic Taking metformin 1000 mg daily, but admits she sometimes forgets it - takes it with dinner Working with [facility] for weight loss and lifestyle changes Low sugar/carbohydrate diet advised She really wants to try mounjaro which her insurance said it would cover  - I think this reasonable, but advised her she should be on a low dose and needs to work more on lifestyle changes Discussed possible side effects.  Stressed regular exercise and high protein intake  Lab Results  Component Value Date   HGBA1C 6.5 09/01/2023

## 2023-11-15 ENCOUNTER — Other Ambulatory Visit (HOSPITAL_COMMUNITY): Payer: Self-pay

## 2023-11-15 ENCOUNTER — Telehealth: Payer: Self-pay | Admitting: Pharmacy Technician

## 2023-11-15 NOTE — Telephone Encounter (Signed)
 Pharmacy Patient Advocate Encounter   Received notification from CoverMyMeds that prior authorization for [facility] 2.5MG  is required/requested.   Insurance verification completed.   The patient is insured through [facility] .   Per test claim: PA required; PA submitted to above mentioned insurance via Phone Key/confirmation #/EOC [id number] Status is pending

## 2023-11-16 ENCOUNTER — Other Ambulatory Visit (HOSPITAL_COMMUNITY): Payer: Self-pay

## 2023-11-16 NOTE — Telephone Encounter (Signed)
 Pharmacy Patient Advocate Encounter  Received notification from [facility] that Prior Authorization for [facility] 2.5MG /0.5ML has been APPROVED from 11/16/23 to 11/15/24. Ran test claim, Copay is $282.04. This test claim was processed through [facility]- copay amounts may vary at other pharmacies due to pharmacy/plan contracts, or as the patient moves through the different stages of their insurance plan.   PA #/Case ID/Reference #: [id number]

## 2023-11-25 ENCOUNTER — Encounter: Payer: Self-pay | Admitting: Podiatry

## 2023-11-25 ENCOUNTER — Ambulatory Visit: Admitting: Podiatry

## 2023-11-25 VITALS — Ht 62.0 in | Wt 170.0 lb

## 2023-11-25 DIAGNOSIS — E119 Type 2 diabetes mellitus without complications: Secondary | ICD-10-CM

## 2023-11-25 DIAGNOSIS — L84 Corns and callosities: Secondary | ICD-10-CM

## 2023-11-25 NOTE — Patient Instructions (Signed)
 Look for urea 40% cream or ointment and apply to the thickened dry skin / calluses. This can be bought over the counter, at a pharmacy or online such as [employer].  You can also scrub the calluses with white vinegar and this to make it easier to file down with a pumice stone.  More silicone and felt pads can be purchased from:  [email or web address]  Look for metatarsal pads and horseshoe pads.  These can also be found on [employer].

## 2023-11-25 NOTE — Progress Notes (Signed)
  Subjective:  Patient ID: Desiree Andrews, female    DOB: 08/27/1955,  MRN: [id number]  Chief Complaint  Patient presents with   Diabetes    " I have tried to use pumice stones, and I have a callous on the ball of the foot and I was recently diagnosed with diabetes, last A1C level was 6.5 and my PCP started my on Monjaro"    68 y.o. female presents with the above complaint. History confirmed with patient. Patient does  have a history of T2DM. Patient does have callus present located at the right foot subfirst, subthird, subfifth metatarsal heads, left subfirst metatarsal head causing pain.  She has history of right ankle fracture which was repaired surgically, does have some mild stiffness from this.  Last A1c 6.5.  Objective:  Physical Exam: warm, good capillary refill, pedal hair growth decreased, telangiectasias noted. nail exam normal nails without lesions DP pulses palpable, PT pulses palpable, and protective sensation intact Left Foot:  Pain with palpation of nails due to elongation and dystrophic growth.  Painful preulcerative callus present subfirst metatarsal head Right Foot: Pain with palpation of nails due to elongation and dystrophic growth.  Painful preulcerative calluses present subfirst, subthird, subfifth metatarsal head  Assessment:   1. Pre-ulcerative calluses   2. Type II diabetes mellitus, well controlled (HCC)      Plan:  Patient was evaluated and treated and all questions answered.  #Hyperkeratotic lesions/pre ulcerative calluses present subfirst, subthird, subfifth metatarsal head right foot, subfirst metatarsal head left foot. All symptomatic hyperkeratoses x 4 separate lesions were safely debrided with a sterile #312 blade to patient's level of comfort without incident. We discussed preventative and palliative care of these lesions including supportive and accommodative shoegear, padding, prefabricated and custom molded accommodative orthoses, use of a  pumice stone and lotions/creams daily.  Patient educated on diabetes. Discussed proper diabetic foot care and discussed risks and complications of disease. Educated patient in depth on reasons to return to the office immediately should he/she discover anything concerning or new on the feet. All questions answered. Discussed proper shoes as well.    Return in about 3 months (around 02/24/2024) for Diabetic Foot Care.         Bronwen Betters, DPM [facility] / [facility]

## 2023-11-27 ENCOUNTER — Other Ambulatory Visit: Payer: Self-pay | Admitting: Internal Medicine

## 2023-12-11 ENCOUNTER — Other Ambulatory Visit: Payer: Self-pay | Admitting: Internal Medicine

## 2023-12-13 ENCOUNTER — Other Ambulatory Visit: Payer: Self-pay | Admitting: Internal Medicine

## 2023-12-28 ENCOUNTER — Encounter: Payer: Self-pay | Admitting: Internal Medicine

## 2024-01-03 DIAGNOSIS — H524 Presbyopia: Secondary | ICD-10-CM | POA: Diagnosis not present

## 2024-01-03 DIAGNOSIS — H35372 Puckering of macula, left eye: Secondary | ICD-10-CM | POA: Diagnosis not present

## 2024-01-03 DIAGNOSIS — E119 Type 2 diabetes mellitus without complications: Secondary | ICD-10-CM | POA: Diagnosis not present

## 2024-01-03 DIAGNOSIS — H52223 Regular astigmatism, bilateral: Secondary | ICD-10-CM | POA: Diagnosis not present

## 2024-01-03 DIAGNOSIS — H5203 Hypermetropia, bilateral: Secondary | ICD-10-CM | POA: Diagnosis not present

## 2024-01-03 DIAGNOSIS — H40023 Open angle with borderline findings, high risk, bilateral: Secondary | ICD-10-CM | POA: Diagnosis not present

## 2024-01-05 ENCOUNTER — Telehealth: Payer: Self-pay

## 2024-01-05 NOTE — Telephone Encounter (Signed)
 This patient is appearing on a report for being at risk of failing the adherence measure for diabetes medications this calendar year.   Medication: metformin  XR 1000 mg daily Last fill date (per Dr. Anson Basta): 09/14/23 for 30 day supply  Based on previous chart documentation on 12/28/23, appears patient had not been taking metformin  but still had some on hand. Patient was instructed to resume metformin  but unsure how much of a supply patient has at home and what the adherence has been like since resuming. Left voicemail for patient to return my call at their convenience.  Abelina Abide, PharmD PGY1 Pharmacy Resident 01/05/2024 10:36 AM

## 2024-01-11 ENCOUNTER — Other Ambulatory Visit: Payer: Self-pay | Admitting: Internal Medicine

## 2024-01-12 DIAGNOSIS — Z6829 Body mass index (BMI) 29.0-29.9, adult: Secondary | ICD-10-CM | POA: Diagnosis not present

## 2024-01-12 DIAGNOSIS — H1131 Conjunctival hemorrhage, right eye: Secondary | ICD-10-CM | POA: Diagnosis not present

## 2024-01-12 NOTE — Telephone Encounter (Signed)
 Attempted to contact patient x3. Calls went straight to voicemail and left voicemail x3. Unable to contact patient at this time.  Abelina Abide, PharmD PGY1 Pharmacy Resident 01/12/2024 9:16 AM

## 2024-01-13 ENCOUNTER — Ambulatory Visit: Admitting: Emergency Medicine

## 2024-01-19 ENCOUNTER — Telehealth: Payer: Self-pay

## 2024-01-19 NOTE — Telephone Encounter (Signed)
 This patient is appearing on a report for being at risk of failing the adherence measure for diabetes medications this calendar year.   Medication: Mounjaro 2.5 mg Last fill date: 01/11/24 for 28 day supply  Insurance report was not up to date. No action needed at this time.   Abelina Abide, PharmD PGY1 Pharmacy Resident 01/19/2024 9:15 AM

## 2024-01-26 DIAGNOSIS — M79644 Pain in right finger(s): Secondary | ICD-10-CM | POA: Diagnosis not present

## 2024-01-26 DIAGNOSIS — M79641 Pain in right hand: Secondary | ICD-10-CM | POA: Diagnosis not present

## 2024-02-08 NOTE — Patient Instructions (Addendum)
      Blood work was ordered.       Medications changes include :   increase mounjaro  to 5 mg weekly      Return in about 6 months (around 08/10/2024) for Physical Exam.

## 2024-02-08 NOTE — Progress Notes (Signed)
 Subjective:    Patient ID: Desiree Andrews, female    DOB: [birth year], 68 y.o.   MRN: [id number]     HPI Desiree Andrews is here for follow up of her chronic medical problems.  Saw ortho and had xrays of right hand - concern for RA - needs blood work done.  She recently had an episode of significant right hand pain.  She does have some chronic pain.  Only drinking one diet coke a day.  Has been eating better.  She has lost some weight.  She has found that the Mounjaro  has decreased her appetite.  Active at work but no other exercise.    Medications and allergies reviewed with patient and updated if appropriate.  Current Outpatient Medications on File Prior to Visit  Medication Sig Dispense Refill   amLODipine  (NORVASC ) 2.5 MG tablet TAKE 1 TABLET(2.5 MG) BY MOUTH DAILY 90 tablet 1   amphetamine -dextroamphetamine  (ADDERALL) 20 MG tablet Take 10 mg by mouth 2 (two) times daily.  0   atorvastatin  (LIPITOR) 40 MG tablet TAKE 1 TABLET BY MOUTH DAILY. 90 tablet 2   buPROPion  (WELLBUTRIN  XL) 150 MG 24 hr tablet TAKE 1 TABLET(150 MG) BY MOUTH DAILY 90 tablet 1   hydrochlorothiazide  (HYDRODIURIL ) 25 MG tablet TAKE 1 TABLET(25 MG) BY MOUTH DAILY 90 tablet 1   ibuprofen (ADVIL) 600 MG tablet Take 600 mg by mouth every 8 (eight) hours as needed.     ketoconazole  (NIZORAL ) 2 % cream Apply to aa's rash on the abdomen QD PRN. 60 g 0   levocetirizine (XYZAL ) 5 MG tablet Take 1 tablet (5 mg total) by mouth every evening. 30 tablet 0   metFORMIN  (GLUCOPHAGE -XR) 500 MG 24 hr tablet Take 2 tablets (1,000 mg total) by mouth daily with breakfast. 60 tablet 0   metoprolol  tartrate (LOPRESSOR ) 50 MG tablet TAKE 1 AND 1/2 TABLETS(75 MG) BY MOUTH TWICE DAILY 270 tablet 1   MOUNJARO  2.5 MG/0.5ML Pen ADMINISTER 2.5 MG UNDER THE SKIN 1 TIME A WEEK 2 mL 0   tacrolimus  (PROTOPIC ) 0.1 % ointment Apply topically 2 (two) times daily. 100 g 2   triamcinolone  cream (KENALOG ) 0.1 % Apply 1 Application topically 2  (two) times daily. 30 g 0   Vitamin D , Cholecalciferol , 25 MCG (1000 UT) CAPS 2,000 IU QD     No current facility-administered medications on file prior to visit.     Review of Systems  Constitutional:  Negative for fever.  Respiratory:  Negative for cough, shortness of breath and wheezing.   Cardiovascular:  Negative for chest pain, palpitations and leg swelling.  Neurological:  Negative for light-headedness and headaches.       Objective:   Vitals:   02/09/24 0921  BP: 120/78  Pulse: 68  Temp: 98.1 F (36.7 C)  SpO2: 98%   BP Readings from Last 3 Encounters:  02/09/24 120/78  11/12/23 128/70  11/05/23 128/82   Wt Readings from Last 3 Encounters:  02/09/24 168 lb (76.2 kg)  11/25/23 170 lb (77.1 kg)  11/12/23 173 lb (78.5 kg)   Body mass index is 30.73 kg/m.    Physical Exam Constitutional:      General: She is not in acute distress.    Appearance: Normal appearance.  HENT:     Head: Normocephalic and atraumatic.   Eyes:     Conjunctiva/sclera: Conjunctivae normal.    Cardiovascular:     Rate and Rhythm: Normal rate and regular rhythm.  Heart sounds: Normal heart sounds.  Pulmonary:     Effort: Pulmonary effort is normal. No respiratory distress.     Breath sounds: Normal breath sounds. No wheezing.   Musculoskeletal:     Cervical back: Neck supple.     Right lower leg: No edema.     Left lower leg: No edema.  Lymphadenopathy:     Cervical: No cervical adenopathy.   Skin:    General: Skin is warm and dry.     Findings: No rash.   Neurological:     Mental Status: She is alert. Mental status is at baseline.   Psychiatric:        Mood and Affect: Mood normal.        Behavior: Behavior normal.        Lab Results  Component Value Date   WBC 7.5 09/01/2023   HGB 14.2 09/01/2023   HCT 42.0 09/01/2023   PLT 295.0 09/01/2023   GLUCOSE 111 (H) 09/01/2023   CHOL 212 (H) 09/01/2023   TRIG 332.0 (H) 09/01/2023   HDL 46.20 09/01/2023    LDLDIRECT 87.0 02/05/2023   LDLCALC 99 09/01/2023   ALT 31 09/01/2023   AST 21 09/01/2023   NA 135 09/01/2023   K 4.0 09/01/2023   CL 99 09/01/2023   CREATININE 0.66 09/01/2023   BUN 18 09/01/2023   CO2 28 09/01/2023   TSH 1.00 09/01/2023   HGBA1C 6.5 09/01/2023     Assessment & Plan:    See Problem List for Assessment and Plan of chronic medical problems.

## 2024-02-09 ENCOUNTER — Ambulatory Visit (INDEPENDENT_AMBULATORY_CARE_PROVIDER_SITE_OTHER): Payer: Medicare Other | Admitting: Internal Medicine

## 2024-02-09 VITALS — BP 120/78 | HR 68 | Temp 98.1°F | Ht 62.0 in | Wt 168.0 lb

## 2024-02-09 DIAGNOSIS — Z7984 Long term (current) use of oral hypoglycemic drugs: Secondary | ICD-10-CM

## 2024-02-09 DIAGNOSIS — E559 Vitamin D deficiency, unspecified: Secondary | ICD-10-CM | POA: Diagnosis not present

## 2024-02-09 DIAGNOSIS — E1169 Type 2 diabetes mellitus with other specified complication: Secondary | ICD-10-CM

## 2024-02-09 DIAGNOSIS — M8589 Other specified disorders of bone density and structure, multiple sites: Secondary | ICD-10-CM

## 2024-02-09 DIAGNOSIS — E785 Hyperlipidemia, unspecified: Secondary | ICD-10-CM

## 2024-02-09 DIAGNOSIS — Z6831 Body mass index (BMI) 31.0-31.9, adult: Secondary | ICD-10-CM

## 2024-02-09 DIAGNOSIS — I10 Essential (primary) hypertension: Secondary | ICD-10-CM

## 2024-02-09 DIAGNOSIS — E66811 Obesity, class 1: Secondary | ICD-10-CM

## 2024-02-09 DIAGNOSIS — M2550 Pain in unspecified joint: Secondary | ICD-10-CM

## 2024-02-09 DIAGNOSIS — F3289 Other specified depressive episodes: Secondary | ICD-10-CM | POA: Diagnosis not present

## 2024-02-09 DIAGNOSIS — E6609 Other obesity due to excess calories: Secondary | ICD-10-CM

## 2024-02-09 DIAGNOSIS — R600 Localized edema: Secondary | ICD-10-CM

## 2024-02-09 MED ORDER — TIRZEPATIDE 5 MG/0.5ML ~~LOC~~ SOAJ: 5.0000 mg | SUBCUTANEOUS | 0 refills | Status: DC

## 2024-02-09 NOTE — Assessment & Plan Note (Addendum)
 Chronic Controlled CMP Continue hydrochlorothiazide  25 mg daily

## 2024-02-09 NOTE — Assessment & Plan Note (Signed)
 Chronic DEXA up-to-date Encouraged regular exercise Advised calcium  and vitamin D  daily Check vitamin D  level

## 2024-02-09 NOTE — Assessment & Plan Note (Addendum)
 Chronic Lab Results  Component Value Date   HGBA1C 6.5 09/01/2023   Sugars controlled Eye exam up-to-date-will get report Check A1c, urine albumin/creatinine ratio Continue metformin  XR 1000 mg daily Continue Mounjaro  weekly - increase to 5 mg  - denies side effects After 1 month we will increase to 7.5 if she wishes, but would like her to stay on that dose for a while Stressed regular exercise Stressed diabetic diet

## 2024-02-09 NOTE — Assessment & Plan Note (Signed)
 Chronic controlled Continue Wellbutrin XL 150 mg daily

## 2024-02-09 NOTE — Assessment & Plan Note (Signed)
Chronic Regular exercise and healthy diet encouraged Check lipid panel, CMP Continue atorvastatin 40 mg daily 

## 2024-02-09 NOTE — Assessment & Plan Note (Signed)
 Chronic Stressed the importance of regular exercise Stressed healthy diet, low carbs and sugars-high in protein and vegetables Currently on Mounjaro  for diabetes which should help with her weight

## 2024-02-09 NOTE — Assessment & Plan Note (Signed)
 Chronic Taking vitamin D daily Check vitamin D level

## 2024-02-09 NOTE — Assessment & Plan Note (Signed)
Chronic Blood pressure well controlled CMP Continue amlodipine 2.5 mg daily, HCTZ 25 mg daily, metoprolol 75 mg twice daily 

## 2024-02-22 ENCOUNTER — Other Ambulatory Visit (INDEPENDENT_AMBULATORY_CARE_PROVIDER_SITE_OTHER)

## 2024-02-22 DIAGNOSIS — M2550 Pain in unspecified joint: Secondary | ICD-10-CM

## 2024-02-22 DIAGNOSIS — E559 Vitamin D deficiency, unspecified: Secondary | ICD-10-CM | POA: Diagnosis not present

## 2024-02-22 DIAGNOSIS — E785 Hyperlipidemia, unspecified: Secondary | ICD-10-CM

## 2024-02-22 DIAGNOSIS — E1169 Type 2 diabetes mellitus with other specified complication: Secondary | ICD-10-CM | POA: Diagnosis not present

## 2024-02-22 DIAGNOSIS — I10 Essential (primary) hypertension: Secondary | ICD-10-CM

## 2024-02-22 LAB — COMPREHENSIVE METABOLIC PANEL WITH GFR
ALT: 38 U/L — ABNORMAL HIGH (ref 0–35)
AST: 28 U/L (ref 0–37)
Albumin: 4.3 g/dL (ref 3.5–5.2)
Alkaline Phosphatase: 65 U/L (ref 39–117)
BUN: 16 mg/dL (ref 6–23)
CO2: 29 meq/L (ref 19–32)
Calcium: 9.9 mg/dL (ref 8.4–10.5)
Chloride: 97 meq/L (ref 96–112)
Creatinine, Ser: 0.72 mg/dL (ref 0.40–1.20)
GFR: 85.05 mL/min (ref >60.00)
Glucose, Bld: 199 mg/dL — ABNORMAL HIGH (ref 70–99)
Potassium: 3.6 meq/L (ref 3.5–5.1)
Sodium: 138 meq/L (ref 135–145)
Total Bilirubin: 0.5 mg/dL (ref 0.2–1.2)
Total Protein: 6.6 g/dL (ref 6.0–8.3)

## 2024-02-22 LAB — CBC WITH DIFFERENTIAL/PLATELET
Basophils Absolute: 0.1 10*3/uL (ref 0.0–0.1)
Basophils Relative: 1.1 % (ref 0.0–3.0)
Eosinophils Absolute: 0.3 10*3/uL (ref 0.0–0.7)
Eosinophils Relative: 4.4 % (ref 0.0–5.0)
HCT: 40.6 % (ref 36.0–46.0)
Hemoglobin: 13.9 g/dL (ref 12.0–15.0)
Lymphocytes Relative: 38.8 % (ref 12.0–46.0)
Lymphs Abs: 2.7 10*3/uL (ref 0.7–4.0)
MCHC: 34.1 g/dL (ref 30.0–36.0)
MCV: 93.9 fl (ref 78.0–100.0)
Monocytes Absolute: 0.4 10*3/uL (ref 0.1–1.0)
Monocytes Relative: 6.2 % (ref 3.0–12.0)
Neutro Abs: 3.5 10*3/uL (ref 1.4–7.7)
Neutrophils Relative %: 49.5 % (ref 43.0–77.0)
Platelets: 267 10*3/uL (ref 150.0–400.0)
RBC: 4.33 Mil/uL (ref 3.87–5.11)
RDW: 13.3 % (ref 11.5–15.5)
WBC: 7 10*3/uL (ref 4.0–10.5)

## 2024-02-22 LAB — VITAMIN D 25 HYDROXY (VIT D DEFICIENCY, FRACTURES): VITD: 23.22 ng/mL — ABNORMAL LOW (ref 30.00–100.00)

## 2024-02-22 LAB — LIPID PANEL
Cholesterol: 213 mg/dL — ABNORMAL HIGH (ref 0–200)
HDL: 44.4 mg/dL (ref >39.00)
NonHDL: 169.08
Total CHOL/HDL Ratio: 5
Triglycerides: 606 mg/dL — ABNORMAL HIGH (ref 0.0–149.0)
VLDL: 121.2 mg/dL — ABNORMAL HIGH (ref 0.0–40.0)

## 2024-02-22 LAB — SEDIMENTATION RATE: Sed Rate: 15 mm/h (ref 0–30)

## 2024-02-22 LAB — C-REACTIVE PROTEIN: CRP: <1 mg/dL (ref 0.5–20.0)

## 2024-02-22 LAB — LDL CHOLESTEROL, DIRECT: Direct LDL: 78 mg/dL

## 2024-02-22 LAB — HEMOGLOBIN A1C: Hgb A1c MFr Bld: 6.2 % (ref 4.6–6.5)

## 2024-02-22 LAB — URIC ACID: Uric Acid, Serum: 6.6 mg/dL (ref 2.4–7.0)

## 2024-02-23 ENCOUNTER — Ambulatory Visit: Payer: Self-pay | Admitting: Internal Medicine

## 2024-02-23 DIAGNOSIS — H40023 Open angle with borderline findings, high risk, bilateral: Secondary | ICD-10-CM | POA: Diagnosis not present

## 2024-02-23 DIAGNOSIS — R768 Other specified abnormal immunological findings in serum: Secondary | ICD-10-CM

## 2024-02-23 DIAGNOSIS — H35372 Puckering of macula, left eye: Secondary | ICD-10-CM | POA: Diagnosis not present

## 2024-02-23 DIAGNOSIS — H02839 Dermatochalasis of unspecified eye, unspecified eyelid: Secondary | ICD-10-CM | POA: Diagnosis not present

## 2024-02-23 DIAGNOSIS — E119 Type 2 diabetes mellitus without complications: Secondary | ICD-10-CM | POA: Diagnosis not present

## 2024-02-23 MED ORDER — OMEGA-3-ACID ETHYL ESTERS 1 G PO CAPS: 2.0000 g | ORAL_CAPSULE | Freq: Two times a day (BID) | ORAL | 5 refills | Status: AC

## 2024-02-24 LAB — ANA: Anti Nuclear Antibody (ANA): NEGATIVE

## 2024-02-24 LAB — RHEUMATOID FACTOR: Rheumatoid fact SerPl-aCnc: 10 [IU]/mL (ref <14)

## 2024-02-24 LAB — MICROALBUMIN / CREATININE URINE RATIO
Creatinine,U: 63.2 mg/dL
Microalb Creat Ratio: UNDETERMINED mg/g (ref 0.0–30.0)
Microalb, Ur: <0.7 mg/dL

## 2024-02-24 LAB — CYCLIC CITRUL PEPTIDE ANTIBODY, IGG: Cyclic Citrullin Peptide Ab: 50 U — ABNORMAL HIGH

## 2024-02-28 ENCOUNTER — Other Ambulatory Visit: Payer: Self-pay | Admitting: Internal Medicine

## 2024-02-29 ENCOUNTER — Other Ambulatory Visit: Payer: Self-pay | Admitting: Internal Medicine

## 2024-03-08 ENCOUNTER — Other Ambulatory Visit: Payer: Self-pay | Admitting: Internal Medicine

## 2024-03-11 ENCOUNTER — Other Ambulatory Visit: Payer: Self-pay | Admitting: Internal Medicine

## 2024-03-15 ENCOUNTER — Telehealth: Payer: Self-pay | Admitting: Pharmacist

## 2024-03-15 NOTE — Progress Notes (Signed)
 Pharmacy Quality Measure Review  This patient is appearing on a report for being at risk of failing the adherence measure for diabetes medications this calendar year.   Medication: Mounjaro  Last fill date: 02/14/24 and 03/13/24 for 28 day supply  Insurance report was not up to date. No action needed at this time.   Darrelyn Drum, PharmD, BCPS, CPP Clinical Pharmacist Practitioner [facility] [phone]

## 2024-04-03 ENCOUNTER — Other Ambulatory Visit: Payer: Self-pay | Admitting: Internal Medicine

## 2024-04-04 ENCOUNTER — Other Ambulatory Visit: Payer: Self-pay | Admitting: Internal Medicine

## 2024-04-10 DIAGNOSIS — K080 Exfoliation of teeth due to systemic causes: Secondary | ICD-10-CM | POA: Diagnosis not present

## 2024-04-20 ENCOUNTER — Other Ambulatory Visit: Payer: Self-pay | Admitting: Internal Medicine

## 2024-05-04 ENCOUNTER — Other Ambulatory Visit: Payer: Self-pay | Admitting: Internal Medicine

## 2024-05-27 ENCOUNTER — Other Ambulatory Visit: Payer: Self-pay | Admitting: Internal Medicine

## 2024-05-31 ENCOUNTER — Other Ambulatory Visit: Payer: Self-pay | Admitting: Internal Medicine

## 2024-06-07 ENCOUNTER — Ambulatory Visit: Payer: Medicare Other | Admitting: Dermatology

## 2024-06-08 ENCOUNTER — Ambulatory Visit: Payer: Medicare Other | Admitting: Dermatology

## 2024-06-08 ENCOUNTER — Encounter: Payer: Self-pay | Admitting: Dermatology

## 2024-06-08 VITALS — BP 136/98

## 2024-06-08 DIAGNOSIS — D229 Melanocytic nevi, unspecified: Secondary | ICD-10-CM

## 2024-06-08 DIAGNOSIS — L304 Erythema intertrigo: Secondary | ICD-10-CM

## 2024-06-08 DIAGNOSIS — D1801 Hemangioma of skin and subcutaneous tissue: Secondary | ICD-10-CM | POA: Diagnosis not present

## 2024-06-08 DIAGNOSIS — Z1283 Encounter for screening for malignant neoplasm of skin: Secondary | ICD-10-CM | POA: Diagnosis not present

## 2024-06-08 DIAGNOSIS — L814 Other melanin hyperpigmentation: Secondary | ICD-10-CM | POA: Diagnosis not present

## 2024-06-08 DIAGNOSIS — L821 Other seborrheic keratosis: Secondary | ICD-10-CM | POA: Diagnosis not present

## 2024-06-08 DIAGNOSIS — L661 Lichen planopilaris, unspecified: Secondary | ICD-10-CM

## 2024-06-08 DIAGNOSIS — W908XXA Exposure to other nonionizing radiation, initial encounter: Secondary | ICD-10-CM

## 2024-06-08 DIAGNOSIS — D239 Other benign neoplasm of skin, unspecified: Secondary | ICD-10-CM

## 2024-06-08 DIAGNOSIS — L578 Other skin changes due to chronic exposure to nonionizing radiation: Secondary | ICD-10-CM

## 2024-06-08 MED ORDER — CLOBETASOL PROPIONATE 0.05 % EX SOLN: 1.0000 | Freq: Every day | CUTANEOUS | 5 refills | Status: AC

## 2024-06-08 MED ORDER — KETOCONAZOLE 2 % EX CREA: TOPICAL_CREAM | CUTANEOUS | 11 refills | Status: AC

## 2024-06-08 MED ORDER — MUPIROCIN 2 % EX OINT: 1.0000 | TOPICAL_OINTMENT | Freq: Every day | CUTANEOUS | 5 refills | Status: AC

## 2024-06-08 NOTE — Patient Instructions (Addendum)
 Date: Thu Jun 08 2024  Hello Desiree Andrews,  Thank you for visiting us  today. We appreciate your commitment to improving your skin and hair health. Here is a summary of the key instructions from today's consultation:  - Prescription Medications:   - CeraVe zinc shampoo: Use for scalp itching and dandruff   - Clobetasol  drops: Apply once daily to hairline - use 1-3 drops and rub in as a thin layer at the hairline   - Ketoconazole  cream mixed with mupirocin ointment: Apply for 10 days for skin fold irritation   - If the area is sore, also mix in Desitin paste with the ketoconazole  and mupirocin   - After 10 days of topical treatment, switch to ketoconazole  powder only   - Do not use powder while using the topical creams as it will create a paste  - Skincare Routine:   - Clean irritated skin fold area with soap and water   - Let cool water run over the area   - Continue using ketoconazole  cream as needed  - Follow-Up: We will see you again in 1 year to assess your progress. Photos will be taken of hairline to monitor progress - one close-up focusing on the affected area and one from farther away.  We look forward to seeing improvement at your next visit. If you have any questions or concerns before then, please do not hesitate to contact our office through MyChart or by phone.  Warm regards,  Dr. Delon Lenis, Dermatology       Important Information  Due to recent changes in healthcare laws, you may see results of your pathology and/or laboratory studies on MyChart before the doctors have had a chance to review them. We understand that in some cases there may be results that are confusing or concerning to you. Please understand that not all results are received at the same time and often the doctors may need to interpret multiple results in order to provide you with the best plan of care or course of treatment. Therefore, we ask that you please give us  2 business days to thoroughly review  all your results before contacting the office for clarification. Should we see a critical lab result, you will be contacted sooner.   If You Need Anything After Your Visit  If you have any questions or concerns for your doctor, please call our main line at [phone] If no one answers, please leave a voicemail as directed and we will return your call as soon as possible. Messages left after 4 pm will be answered the following business day.   You may also send us  a message via MyChart. We typically respond to MyChart messages within 1-2 business days.  For prescription refills, please ask your pharmacy to contact our office. Our fax number is [phone].  If you have an urgent issue when the clinic is closed that cannot wait until the next business day, you can page your doctor at the number below.    Please note that while we do our best to be available for urgent issues outside of office hours, we are not available 24/7.   If you have an urgent issue and are unable to reach us , you may choose to seek medical care at your doctor's office, retail clinic, urgent care center, or emergency room.  If you have a medical emergency, please immediately call 911 or go to the emergency department. In the event of inclement weather, please call our main line at [phone]  for an update on the status of any delays or closures.  Dermatology Medication Tips: Please keep the boxes that topical medications come in in order to help keep track of the instructions about where and how to use these. Pharmacies typically print the medication instructions only on the boxes and not directly on the medication tubes.   If your medication is too expensive, please contact our office at [phone] or send us  a message through MyChart.   We are unable to tell what your co-pay for medications will be in advance as this is different depending on your insurance coverage. However, we may be able to find a substitute  medication at lower cost or fill out paperwork to get insurance to cover a needed medication.   If a prior authorization is required to get your medication covered by your insurance company, please allow us  1-2 business days to complete this process.  Drug prices often vary depending on where the prescription is filled and some pharmacies may offer cheaper prices.  The website www.goodrx.com contains coupons for medications through different pharmacies. The prices here do not account for what the cost may be with help from insurance (it may be cheaper with your insurance), but the website can give you the price if you did not use any insurance.  - You can print the associated coupon and take it with your prescription to the pharmacy.  - You may also stop by our office during regular business hours and pick up a GoodRx coupon card.  - If you need your prescription sent electronically to a different pharmacy, notify our office through [facility] or by phone at [phone]

## 2024-06-08 NOTE — Progress Notes (Signed)
   Follow-Up Visit   Subjective  Uniqua Notnamed Croucher is a 68 y.o. female who presents for the following: Skin Cancer Screening and Full Body Skin Exam - History of dysplastic nevi  History of lichen sclerosus - she uses clobetasol  cream when it flares, which is about every 3-4 months.  The patient presents for Total-Body Skin Exam (TBSE) for skin cancer screening and mole check. The patient has spots, moles and lesions to be evaluated, some may be new or changing and the patient may have concern these could be cancer.    The following portions of the chart were reviewed this encounter and updated as appropriate: medications, allergies, medical history  Review of Systems:  No other skin or systemic complaints except as noted in HPI or Assessment and Plan.  Objective  Well appearing patient in no apparent distress; mood and affect are within normal limits.  A full examination was performed including scalp, head, eyes, ears, nose, lips, neck, chest, axillae, abdomen, back, buttocks, bilateral upper extremities, bilateral lower extremities, hands, feet, fingers, toes, fingernails, and toenails. All findings within normal limits unless otherwise noted below.   Relevant physical exam findings are noted in the Assessment and Plan.    Assessment & Plan   SKIN CANCER SCREENING PERFORMED TODAY.  ACTINIC DAMAGE - Chronic condition, secondary to cumulative UV/sun exposure - diffuse scaly erythematous macules with underlying dyspigmentation - Recommend daily broad spectrum sunscreen SPF 30+ to sun-exposed areas, reapply every 2 hours as needed.  - Staying in the shade or wearing long sleeves, sun glasses (UVA+UVB protection) and wide brim hats (4-inch brim around the entire circumference of the hat) are also recommended for sun protection.  - Call for new or changing lesions.  LENTIGINES, SEBORRHEIC KERATOSES, HEMANGIOMAS - Benign normal skin lesions - Benign-appearing - Call for any  changes  MELANOCYTIC NEVI - Tan-brown and/or pink-flesh-colored symmetric macules and papules - Benign appearing on exam today - Observation - Call clinic for new or changing moles - Recommend daily use of broad spectrum spf 30+ sunscreen to sun-exposed areas.    History of Dysplastic Nevi - No evidence of recurrence today - Recommend regular full body skin exams - Recommend daily broad spectrum sunscreen SPF 30+ to sun-exposed areas, reapply every 2 hours as needed.  - Call if any new or changing lesions are noted between office visits  LICHEN PLANOPILARIS :   Treatment Plan: Clobetasol  solution daily to frontal hairline  DERMATOFIBROMA Exam: Firm pink/brown papulenodule with dimple sign.  Treatment Plan: A dermatofibroma is a benign growth possibly related to trauma, such as an insect bite, cut from shaving, or inflamed acne-type bump.  Treatment options to remove include shave or excision with resulting scar and risk of recurrence.  Since benign-appearing and not bothersome, will observe for now.   INTERTRIGO Exam: Erythematous macerated patches in body folds   Treatment Plan: Mix ketoconazole  2% cream with Mupirocin ointment daily for 10 days. If it gets sore, add Desitin cream. After 10 days, start Zeasorb powder daily.   Return in about 1 year (around 06/08/2025) for TBSE History of dysplastic nevi.  I, Roseline Hutchinson, CMA, am acting as scribe for [employer], DO .   Documentation: I have reviewed the above documentation for accuracy and completeness, and I agree with the above.  Delon Lenis, DO

## 2024-06-13 ENCOUNTER — Other Ambulatory Visit: Payer: Self-pay | Admitting: Internal Medicine

## 2024-06-14 ENCOUNTER — Encounter: Payer: Self-pay | Admitting: *Deleted

## 2024-06-14 NOTE — Progress Notes (Signed)
 Shivonne Schwartzman                                          MRN: [id number]   06/14/2024   The [facility] Quality Team Specialist reviewed this patient medical record for the purposes of chart review for care gap closure. The following were reviewed: abstraction for care gap closure-kidney health evaluation for diabetes:eGFR  and uACR.    [facility] Quality Team

## 2024-06-29 ENCOUNTER — Other Ambulatory Visit: Payer: Self-pay | Admitting: Internal Medicine

## 2024-07-17 ENCOUNTER — Telehealth: Payer: Self-pay | Admitting: Internal Medicine

## 2024-07-17 NOTE — Telephone Encounter (Signed)
 Copied from CRM [id number]. Topic: General - Other >> Jul 17, 2024 11:22 AM Alexandria E wrote: Reason for CRM: [facility] with [employer] called in checking on the status of a form that was faxed on 11/10, Nidia stated this was for the patient to receive a 90-day supply of her MOUNJARO  5 MG/0.5ML Pen. Callback number for Nidia is [phone]. Case ID [id number].

## 2024-07-26 ENCOUNTER — Other Ambulatory Visit: Payer: Self-pay | Admitting: Internal Medicine

## 2024-07-29 ENCOUNTER — Other Ambulatory Visit: Payer: Self-pay | Admitting: Internal Medicine

## 2024-07-31 ENCOUNTER — Encounter: Admitting: Internal Medicine

## 2024-08-04 ENCOUNTER — Other Ambulatory Visit: Payer: Self-pay | Admitting: Medical Genetics

## 2024-08-10 ENCOUNTER — Encounter: Payer: Self-pay | Admitting: Internal Medicine

## 2024-08-10 NOTE — Progress Notes (Unsigned)
 Subjective:    Patient ID: Desiree Andrews, female    DOB: 01-Jan-1956, 68 y.o.   MRN: [id number]      HPI Desiree Andrews is here for a Physical exam and her chronic medical problems.      Medications and allergies reviewed with patient and updated if appropriate.  Medications Ordered Prior to Encounter[1]  Review of Systems     Objective:  There were no vitals filed for this visit. There were no vitals filed for this visit. There is no height or weight on file to calculate BMI.  BP Readings from Last 3 Encounters:  06/08/24 (!) 136/98  02/09/24 120/78  11/12/23 128/70    Wt Readings from Last 3 Encounters:  02/09/24 168 lb (76.2 kg)  11/25/23 170 lb (77.1 kg)  11/12/23 173 lb (78.5 kg)       Physical Exam Constitutional: She appears well-developed and well-nourished. No distress.  HENT:  Head: Normocephalic and atraumatic.  Right Ear: External ear normal. Normal ear canal and TM Left Ear: External ear normal.  Normal ear canal and TM Mouth/Throat: Oropharynx is clear and moist.  Eyes: Conjunctivae normal.  Neck: Neck supple. No tracheal deviation present. No thyromegaly present.  No carotid bruit  Cardiovascular: Normal rate, regular rhythm and normal heart sounds.   No murmur heard.  No edema. Pulmonary/Chest: Effort normal and breath sounds normal. No respiratory distress. She has no wheezes. She has no rales.  Breast: deferred   Abdominal: Soft. She exhibits no distension. There is no tenderness.  Lymphadenopathy: She has no cervical adenopathy.  Skin: Skin is warm and dry. She is not diaphoretic.  Psychiatric: She has a normal mood and affect. Her behavior is normal.     Lab Results  Component Value Date   WBC 7.0 02/22/2024   HGB 13.9 02/22/2024   HCT 40.6 02/22/2024   PLT 267.0 02/22/2024   GLUCOSE 199 (H) 02/22/2024   CHOL 213 (H) 02/22/2024   TRIG (H) 02/22/2024    606.0 Triglyceride is over 400; calculations on Lipids are invalid.   HDL  44.40 02/22/2024   LDLDIRECT 78.0 02/22/2024   LDLCALC 99 09/01/2023   ALT 38 (H) 02/22/2024   AST 28 02/22/2024   NA 138 02/22/2024   K 3.6 02/22/2024   CL 97 02/22/2024   CREATININE 0.72 02/22/2024   BUN 16 02/22/2024   CO2 29 02/22/2024   TSH 1.00 09/01/2023   HGBA1C 6.2 02/22/2024   MICROALBUR <0.7 02/24/2024         Assessment & Plan:   Physical exam: Screening blood work  ordered Exercise   Weight   Substance abuse  none   Reviewed recommended immunizations.   Health Maintenance  Topic Date Due   Medicare Annual Wellness (AWV)  Never done   Influenza Vaccine  03/24/2024   COVID-19 Vaccine (4 - 2025-26 season) 04/24/2024   HEMOGLOBIN A1C  08/24/2024   FOOT EXAM  11/24/2024   OPHTHALMOLOGY EXAM  01/02/2025   Diabetic kidney evaluation - eGFR measurement  02/21/2025   Diabetic kidney evaluation - Urine ACR  02/23/2025   Mammogram  10/17/2025   DTaP/Tdap/Td (3 - Td or Tdap) 02/05/2026   Bone Density Scan  10/17/2026   Colonoscopy  01/27/2027   Pneumococcal Vaccine: 50+ Years  Completed   Hepatitis C Screening  Completed   Zoster Vaccines- Shingrix  Completed   Meningococcal B Vaccine  Aged Out          See Problem List  for Assessment and Plan of chronic medical problems.        [1]  Current Outpatient Medications on File Prior to Visit  Medication Sig Dispense Refill   amLODipine  (NORVASC ) 2.5 MG tablet TAKE 1 TABLET(2.5 MG) BY MOUTH DAILY 90 tablet 1   amphetamine-dextroamphetamine (ADDERALL) 20 MG tablet Take 10 mg by mouth 2 (two) times daily.  0   atorvastatin  (LIPITOR) 40 MG tablet TAKE 1 TABLET BY MOUTH DAILY 90 tablet 2   buPROPion  (WELLBUTRIN  XL) 150 MG 24 hr tablet TAKE 1 TABLET(150 MG) BY MOUTH DAILY 90 tablet 1   clobetasol  (TEMOVATE ) 0.05 % external solution Apply 1 Application topically daily. Apply to frontal hairline daily 50 mL 5   hydrochlorothiazide  (HYDRODIURIL ) 25 MG tablet TAKE 1 TABLET(25 MG) BY MOUTH DAILY 90 tablet 1    ibuprofen (ADVIL) 600 MG tablet Take 600 mg by mouth every 8 (eight) hours as needed.     ketoconazole  (NIZORAL ) 2 % cream Apply to aa's rash on the abdomen QD PRN. 60 g 11   levocetirizine (XYZAL ) 5 MG tablet Take 1 tablet (5 mg total) by mouth every evening. 30 tablet 0   metFORMIN  (GLUCOPHAGE -XR) 500 MG 24 hr tablet Take 2 tablets (1,000 mg total) by mouth daily with breakfast. 60 tablet 0   metoprolol  tartrate (LOPRESSOR ) 50 MG tablet TAKE 1 AND 1/2 TABLETS(75 MG) BY MOUTH TWICE DAILY 270 tablet 1   MOUNJARO  5 MG/0.5ML Pen ADMINISTER 5 MG UNDER THE SKIN 1 TIME A WEEK 2 mL 0   mupirocin  ointment (BACTROBAN ) 2 % Apply 1 Application topically daily. Apply to affected area as directed daily for 10 days 22 g 5   omega-3 acid ethyl esters (LOVAZA ) 1 g capsule Take 2 capsules (2 g total) by mouth 2 (two) times daily. 120 capsule 5   tacrolimus  (PROTOPIC ) 0.1 % ointment Apply topically 2 (two) times daily. 100 g 2   triamcinolone  cream (KENALOG ) 0.1 % Apply 1 Application topically 2 (two) times daily. 30 g 0   Vitamin D , Cholecalciferol , 25 MCG (1000 UT) CAPS 2,000 IU QD     No current facility-administered medications on file prior to visit.

## 2024-08-10 NOTE — Patient Instructions (Addendum)

## 2024-08-11 ENCOUNTER — Ambulatory Visit: Payer: Self-pay | Admitting: Internal Medicine

## 2024-08-11 ENCOUNTER — Ambulatory Visit: Admitting: Internal Medicine

## 2024-08-11 VITALS — BP 138/84 | HR 89 | Temp 97.9°F | Resp 17 | Ht 62.0 in | Wt 156.0 lb

## 2024-08-11 DIAGNOSIS — M8589 Other specified disorders of bone density and structure, multiple sites: Secondary | ICD-10-CM

## 2024-08-11 DIAGNOSIS — Z0000 Encounter for general adult medical examination without abnormal findings: Secondary | ICD-10-CM | POA: Diagnosis not present

## 2024-08-11 DIAGNOSIS — I152 Hypertension secondary to endocrine disorders: Secondary | ICD-10-CM

## 2024-08-11 DIAGNOSIS — E1159 Type 2 diabetes mellitus with other circulatory complications: Secondary | ICD-10-CM

## 2024-08-11 DIAGNOSIS — R0989 Other specified symptoms and signs involving the circulatory and respiratory systems: Secondary | ICD-10-CM

## 2024-08-11 DIAGNOSIS — Z136 Encounter for screening for cardiovascular disorders: Secondary | ICD-10-CM | POA: Diagnosis not present

## 2024-08-11 DIAGNOSIS — E1169 Type 2 diabetes mellitus with other specified complication: Secondary | ICD-10-CM

## 2024-08-11 DIAGNOSIS — I6523 Occlusion and stenosis of bilateral carotid arteries: Secondary | ICD-10-CM

## 2024-08-11 DIAGNOSIS — E785 Hyperlipidemia, unspecified: Secondary | ICD-10-CM

## 2024-08-11 DIAGNOSIS — E6609 Other obesity due to excess calories: Secondary | ICD-10-CM

## 2024-08-11 DIAGNOSIS — E559 Vitamin D deficiency, unspecified: Secondary | ICD-10-CM

## 2024-08-11 DIAGNOSIS — Z6830 Body mass index (BMI) 30.0-30.9, adult: Secondary | ICD-10-CM

## 2024-08-11 DIAGNOSIS — R600 Localized edema: Secondary | ICD-10-CM

## 2024-08-11 DIAGNOSIS — Z7984 Long term (current) use of oral hypoglycemic drugs: Secondary | ICD-10-CM

## 2024-08-11 DIAGNOSIS — F3289 Other specified depressive episodes: Secondary | ICD-10-CM

## 2024-08-11 LAB — LIPID PANEL
Cholesterol: 149 mg/dL (ref 28–200)
HDL: 50 mg/dL
LDL Cholesterol: 71 mg/dL (ref 10–99)
NonHDL: 99.41
Total CHOL/HDL Ratio: 3
Triglycerides: 144 mg/dL (ref 10.0–149.0)
VLDL: 28.8 mg/dL (ref 0.0–40.0)

## 2024-08-11 LAB — COMPREHENSIVE METABOLIC PANEL WITH GFR
ALT: 36 U/L — ABNORMAL HIGH (ref 3–35)
AST: 33 U/L (ref 5–37)
Albumin: 4.6 g/dL (ref 3.5–5.2)
Alkaline Phosphatase: 66 U/L (ref 39–117)
BUN: 19 mg/dL (ref 6–23)
CO2: 30 meq/L (ref 19–32)
Calcium: 10.3 mg/dL (ref 8.4–10.5)
Chloride: 99 meq/L (ref 96–112)
Creatinine, Ser: 0.82 mg/dL (ref 0.40–1.20)
GFR: 72.52 mL/min
Glucose, Bld: 93 mg/dL (ref 70–99)
Potassium: 3.6 meq/L (ref 3.5–5.1)
Sodium: 139 meq/L (ref 135–145)
Total Bilirubin: 0.5 mg/dL (ref 0.2–1.2)
Total Protein: 7.4 g/dL (ref 6.0–8.3)

## 2024-08-11 LAB — CBC
HCT: 44.1 % (ref 36.0–46.0)
Hemoglobin: 14.9 g/dL (ref 12.0–15.0)
MCHC: 33.8 g/dL (ref 30.0–36.0)
MCV: 94.4 fl (ref 78.0–100.0)
Platelets: 282 K/uL (ref 150.0–400.0)
RBC: 4.67 Mil/uL (ref 3.87–5.11)
RDW: 13.2 % (ref 11.5–15.5)
WBC: 8 K/uL (ref 4.0–10.5)

## 2024-08-11 LAB — VITAMIN D 25 HYDROXY (VIT D DEFICIENCY, FRACTURES): VITD: 33.55 ng/mL (ref 30.00–100.00)

## 2024-08-11 LAB — HEMOGLOBIN A1C: Hgb A1c MFr Bld: 5.7 % (ref 4.6–6.5)

## 2024-08-11 LAB — TSH: TSH: 0.63 u[IU]/mL (ref 0.35–5.50)

## 2024-08-11 NOTE — Assessment & Plan Note (Addendum)
 Chronic Regular exercise and healthy diet encouraged Check lipid panel, CMP Continue atorvastatin  40 mg daily High ascvd risk Ct cac ordered-May need further lipid management

## 2024-08-11 NOTE — Assessment & Plan Note (Addendum)
 Chronic Blood pressure borderline controlled, but okay for age CMP, cbc Continue amlodipine  2.5 mg daily, HCTZ 25 mg daily, metoprolol  75 mg twice daily

## 2024-08-11 NOTE — Assessment & Plan Note (Signed)
 Chronic Stressed the importance of regular exercise Stressed healthy diet, low carbs and sugars-high in protein and vegetables Currently on Mounjaro  for diabetes which should help with her weight

## 2024-08-11 NOTE — Assessment & Plan Note (Signed)
 Chronic Taking vitamin D daily Check vitamin D level

## 2024-08-11 NOTE — Assessment & Plan Note (Addendum)
 Chronic Associated with hyperlipidemia, hypertension Lab Results  Component Value Date   HGBA1C 6.2 02/22/2024   Sugars controlled Check A1c  Continue metformin  XR 1000 mg daily Continue Mounjaro  5 mg weekly  Stressed regular exercise-she is doing some walking and plans on starting Pilates  Stressed diabetic diet

## 2024-08-11 NOTE — Assessment & Plan Note (Signed)
 Possible right-sided carotid bruit Will check ultrasound of carotid arteries

## 2024-08-11 NOTE — Assessment & Plan Note (Signed)
 Chronic DEXA up-to-date Encouraged regular exercise Advised calcium  and vitamin D  daily Check vitamin D  level

## 2024-08-11 NOTE — Assessment & Plan Note (Signed)
 Chronic Controlled CMP Continue hydrochlorothiazide  25 mg daily

## 2024-08-11 NOTE — Assessment & Plan Note (Signed)
 Chronic controlled Continue Wellbutrin XL 150 mg daily

## 2024-08-18 ENCOUNTER — Other Ambulatory Visit: Payer: Self-pay | Admitting: Internal Medicine

## 2024-08-29 ENCOUNTER — Ambulatory Visit (HOSPITAL_COMMUNITY)
Admission: RE | Admit: 2024-08-29 | Discharge: 2024-08-29 | Disposition: A | Discharge status: Home / Self Care | Source: Ambulatory Visit | Attending: Internal Medicine | Admitting: Internal Medicine

## 2024-08-29 DIAGNOSIS — R0989 Other specified symptoms and signs involving the circulatory and respiratory systems: Secondary | ICD-10-CM | POA: Diagnosis present

## 2024-08-31 DIAGNOSIS — I6529 Occlusion and stenosis of unspecified carotid artery: Secondary | ICD-10-CM | POA: Insufficient documentation

## 2024-09-08 NOTE — Progress Notes (Signed)
 Desiree Andrews                                          MRN: [id number]   09/08/2024   The [facility] Quality Team Specialist reviewed this patient medical record for the purposes of chart review for care gap closure. The following were reviewed: chart review for care gap closure-kidney health evaluation for diabetes:eGFR  and uACR.    [facility]BCI Quality Team

## 2024-09-12 NOTE — Progress Notes (Signed)
 Desiree Andrews                                          MRN: [id number]   09/12/2024   The [facility]BCI Quality Team Specialist reviewed this patient medical record for the purposes of chart review for care gap closure. The following were reviewed: abstraction for care gap closure-controlling blood pressure.    [facility]BCI Quality Team

## 2024-09-19 ENCOUNTER — Other Ambulatory Visit: Payer: Self-pay | Admitting: Internal Medicine

## 2024-10-30 ENCOUNTER — Ambulatory Visit: Admitting: Dermatology

## 2024-11-09 ENCOUNTER — Ambulatory Visit: Admitting: Internal Medicine

## 2025-02-09 ENCOUNTER — Ambulatory Visit: Admitting: Internal Medicine

## 2025-06-11 ENCOUNTER — Ambulatory Visit: Admitting: Dermatology
